# Patient Record
Sex: Male | Born: 1950
Health system: Southern US, Community
[De-identification: ages and names within clinical notes are randomized; demographics above are authoritative.]

## PROBLEM LIST (undated history)

## (undated) DIAGNOSIS — G250 Essential tremor: Secondary | ICD-10-CM

## (undated) DIAGNOSIS — T7840XA Allergy, unspecified, initial encounter: Secondary | ICD-10-CM

## (undated) DIAGNOSIS — J301 Allergic rhinitis due to pollen: Secondary | ICD-10-CM

## (undated) DIAGNOSIS — F1910 Other psychoactive substance abuse, uncomplicated: Secondary | ICD-10-CM

## (undated) DIAGNOSIS — F1010 Alcohol abuse, uncomplicated: Secondary | ICD-10-CM

## (undated) DIAGNOSIS — Q602 Renal agenesis, unspecified: Secondary | ICD-10-CM

## (undated) DIAGNOSIS — C61 Malignant neoplasm of prostate: Secondary | ICD-10-CM

## (undated) DIAGNOSIS — K635 Polyp of colon: Secondary | ICD-10-CM

## (undated) DIAGNOSIS — Q600 Renal agenesis, unilateral: Secondary | ICD-10-CM

## (undated) DIAGNOSIS — N529 Male erectile dysfunction, unspecified: Secondary | ICD-10-CM

## (undated) DIAGNOSIS — K5520 Angiodysplasia of colon without hemorrhage: Secondary | ICD-10-CM

## (undated) HISTORY — DX: Essential tremor: G25.0

## (undated) HISTORY — DX: Renal agenesis, unilateral: Q60.0

## (undated) HISTORY — DX: Angiodysplasia of colon without hemorrhage: K55.20

## (undated) HISTORY — DX: Malignant neoplasm of prostate: C61

## (undated) HISTORY — DX: Male erectile dysfunction, unspecified: N52.9

## (undated) HISTORY — PX: PROSTATE SURGERY: SHX751

## (undated) HISTORY — DX: Allergy, unspecified, initial encounter: T78.40XA

## (undated) HISTORY — DX: Other psychoactive substance abuse, uncomplicated: F19.10

## (undated) HISTORY — PX: COLONOSCOPY: SHX174

## (undated) HISTORY — DX: Renal agenesis, unspecified: Q60.2

## (undated) HISTORY — DX: Alcohol abuse, uncomplicated: F10.10

## (undated) HISTORY — DX: Polyp of colon: K63.5

## (undated) HISTORY — DX: Allergic rhinitis due to pollen: J30.1

---

## 1975-03-11 HISTORY — PX: VASECTOMY: SHX75

## 1996-03-10 DIAGNOSIS — G250 Essential tremor: Secondary | ICD-10-CM

## 1996-03-10 HISTORY — DX: Essential tremor: G25.0

## 2007-03-11 DIAGNOSIS — F1010 Alcohol abuse, uncomplicated: Secondary | ICD-10-CM

## 2007-03-11 HISTORY — DX: Alcohol abuse, uncomplicated: F10.10

## 2008-03-10 DIAGNOSIS — C61 Malignant neoplasm of prostate: Secondary | ICD-10-CM

## 2008-03-10 HISTORY — PX: ROBOT ASSISTED LAPAROSCOPIC RADICAL PROSTATECTOMY: SHX5141

## 2008-03-10 HISTORY — DX: Malignant neoplasm of prostate: C61

## 2009-03-10 DIAGNOSIS — K635 Polyp of colon: Secondary | ICD-10-CM

## 2009-03-10 HISTORY — DX: Polyp of colon: K63.5

## 2010-03-10 HISTORY — PX: KNEE ARTHROSCOPY: SUR90

## 2012-08-31 ENCOUNTER — Telehealth: Payer: Self-pay | Admitting: Radiology

## 2012-08-31 ENCOUNTER — Ambulatory Visit (INDEPENDENT_AMBULATORY_CARE_PROVIDER_SITE_OTHER): Payer: BC Managed Care – PPO | Admitting: Internal Medicine

## 2012-08-31 ENCOUNTER — Encounter: Payer: Self-pay | Admitting: Internal Medicine

## 2012-08-31 VITALS — BP 130/80 | HR 67 | Temp 98.0°F | Ht 73.0 in | Wt 183.0 lb

## 2012-08-31 DIAGNOSIS — Z23 Encounter for immunization: Secondary | ICD-10-CM

## 2012-08-31 DIAGNOSIS — Q600 Renal agenesis, unilateral: Secondary | ICD-10-CM | POA: Insufficient documentation

## 2012-08-31 DIAGNOSIS — N529 Male erectile dysfunction, unspecified: Secondary | ICD-10-CM

## 2012-08-31 DIAGNOSIS — Z8546 Personal history of malignant neoplasm of prostate: Secondary | ICD-10-CM | POA: Insufficient documentation

## 2012-08-31 DIAGNOSIS — J301 Allergic rhinitis due to pollen: Secondary | ICD-10-CM | POA: Insufficient documentation

## 2012-08-31 DIAGNOSIS — G250 Essential tremor: Secondary | ICD-10-CM | POA: Insufficient documentation

## 2012-08-31 DIAGNOSIS — C61 Malignant neoplasm of prostate: Secondary | ICD-10-CM

## 2012-08-31 DIAGNOSIS — G252 Other specified forms of tremor: Secondary | ICD-10-CM

## 2012-08-31 DIAGNOSIS — Z0000 Encounter for general adult medical examination without abnormal findings: Secondary | ICD-10-CM

## 2012-08-31 LAB — HEPATIC FUNCTION PANEL
Bilirubin, Direct: 0.2 mg/dL (ref 0.0–0.3)
Total Bilirubin: 1.1 mg/dL (ref 0.3–1.2)

## 2012-08-31 LAB — TSH: TSH: 1.56 u[IU]/mL (ref 0.35–5.50)

## 2012-08-31 LAB — LIPID PANEL
HDL: 91.3 mg/dL (ref >39.00)
Triglycerides: 49 mg/dL (ref 0.0–149.0)

## 2012-08-31 LAB — CBC WITH DIFFERENTIAL/PLATELET
Basophils Absolute: 0.1 10*3/uL (ref 0.0–0.1)
Basophils Relative: 0.6 % (ref 0.0–3.0)
Eosinophils Absolute: 0.1 10*3/uL (ref 0.0–0.7)
Lymphocytes Relative: 20.2 % (ref 12.0–46.0)
MCHC: 33.6 g/dL (ref 30.0–36.0)
Neutrophils Relative %: 70.9 % (ref 43.0–77.0)
Platelets: 233 10*3/uL (ref 150.0–400.0)
RBC: 5.1 Mil/uL (ref 4.22–5.81)
RDW: 15 % — ABNORMAL HIGH (ref 11.5–14.6)

## 2012-08-31 LAB — BASIC METABOLIC PANEL
BUN: 12 mg/dL (ref 6–23)
CO2: 28 mEq/L (ref 19–32)
Calcium: 10 mg/dL (ref 8.4–10.5)
Creatinine, Ser: 1.1 mg/dL (ref 0.4–1.5)

## 2012-08-31 LAB — PSA: PSA: 0.01 ng/mL — ABNORMAL LOW (ref 0.10–4.00)

## 2012-08-31 MED ORDER — PROPRANOLOL HCL 40 MG PO TABS: 40.0000 mg | ORAL_TABLET | Freq: Two times a day (BID) | ORAL | Status: DC

## 2012-08-31 MED ORDER — SILDENAFIL CITRATE 100 MG PO TABS: 100.0000 mg | ORAL_TABLET | Freq: Every day | ORAL | Status: DC | PRN

## 2012-08-31 NOTE — Assessment & Plan Note (Signed)
Overdue for PSA Will set up with urology if any bump

## 2012-08-31 NOTE — Assessment & Plan Note (Signed)
occ claritin

## 2012-08-31 NOTE — Telephone Encounter (Signed)
[facility] called a critical K+ - 6.2. Results given to Dr Alphonsus Sias.

## 2012-08-31 NOTE — Progress Notes (Signed)
Subjective:    Patient ID: Juan Holmes, male    DOB: Mar 24, 1950, 62 y.o.   MRN: [id number]  HPI Moved to [facility] about a year ago--from [facility] Hasn't seen a primary care doctor since then--last physical over a year ago  Prostate cancer 2010 Had prostatectomy Last PSA 0.01 about 1.5 years ago  Seasonal allergies-- worse here than in [facility] Will rarely take claritin  Diagnosis of essential tremor Only right hand tremor with intention Propranolol helps Goes back 5-6 years and hasn't progressed No family history  Still smokes but not daily Is trying patch to quit and has tried e-cigarettes Usually with beer on weekends  No current outpatient prescriptions on file prior to visit.   No current facility-administered medications on file prior to visit.    Allergies  Allergen Reactions  . Penicillins Hives    Past Medical History  Diagnosis Date  . Benign essential tremor 1998  . Prostate cancer 2010    total prostatectomy  . Allergic rhinitis due to pollen   . ED (erectile dysfunction)     Past Surgical History  Procedure Laterality Date  . Robot assisted laparoscopic radical prostatectomy N/A 2010    [facility] urology   . Knee arthroscopy Right 2012  . Vasectomy  1977    Family History  Problem Relation Age of Onset  . Cancer Mother   . Heart disease Mother   . Stroke Father   . Hyperlipidemia Brother   . Hypertension Brother   . Diabetes Neg Hx   . Hyperlipidemia Brother   . Hypertension Brother   . Hyperlipidemia Brother   . Hypertension Brother   . Hyperlipidemia Brother   . Hypertension Brother     History   Social History  . Marital Status: Divorced    Spouse Name: N/A    Number of Children: 2  . Years of Education: N/A   Occupational History  . Nurse, adult     Retired   Social History Main Topics  . Smoking status: Current Some Day Smoker  . Smokeless tobacco: Never Used  . Alcohol Use: Yes  . Drug Use: No   . Sexually Active: Not on file   Other Topics Concern  . Not on file   Social History Narrative  . No narrative on file   Review of Systems  Constitutional: Negative for fatigue and unexpected weight change.       Wears seat belt Runs regularly and keeps in shape  HENT: Positive for congestion and rhinorrhea. Negative for hearing loss, dental problem and tinnitus.        Regular with dentist  Eyes: Negative for redness and visual disturbance.  Respiratory: Negative for cough, chest tightness and shortness of breath.   Cardiovascular: Negative for chest pain, palpitations and leg swelling.  Gastrointestinal: Negative for nausea, vomiting, abdominal pain, constipation and blood in stool.  Endocrine: Negative for cold intolerance and heat intolerance.  Genitourinary: Negative for difficulty urinating.       No incontinence Generally satisfied with levitra  Musculoskeletal: Negative for back pain, joint swelling and arthralgias.       Does see chiropractor every other week--makes him feel better  Skin: Negative for rash.       Sees a dermatologist regularly  Allergic/Immunologic: Positive for environmental allergies. Negative for immunocompromised state.       Spring mostly PRN claritin  Neurological: Positive for numbness. Negative for dizziness, syncope, weakness, light-headedness and headaches.  Arms numb when sleeping --- gets better with movement  Hematological: Negative for adenopathy. Does not bruise/bleed easily.  Psychiatric/Behavioral: Negative for sleep disturbance and dysphoric mood. The patient is not nervous/anxious.        In relationship with fiancee From [place]  They live together       Objective:   Physical Exam  Constitutional: He is oriented to person, place, and time. He appears well-developed and well-nourished. No distress.  HENT:  Head: Normocephalic and atraumatic.  Right Ear: External ear normal.  Left Ear: External ear normal.   Mouth/Throat: Oropharynx is clear and moist. No oropharyngeal exudate.  Eyes: Conjunctivae and EOM are normal. Pupils are equal, round, and reactive to light.  Neck: Normal range of motion. Neck supple. No thyromegaly present.  Cardiovascular: Normal rate, regular rhythm, normal heart sounds and intact distal pulses.  Exam reveals no gallop.   No murmur heard. Pulmonary/Chest: Effort normal and breath sounds normal. No respiratory distress. He has no wheezes. He has no rales.  Abdominal: Soft. There is no tenderness.  Musculoskeletal: He exhibits no edema and no tenderness.  Lymphadenopathy:    He has no cervical adenopathy.  Neurological: He is alert and oriented to person, place, and time.  Skin: No rash noted. No erythema.  Psychiatric: He has a normal mood and affect. His behavior is normal.          Assessment & Plan:

## 2012-08-31 NOTE — Assessment & Plan Note (Signed)
Healthy Colonoscopy due in 2016 Tdap today

## 2012-08-31 NOTE — Assessment & Plan Note (Signed)
Does okay with the med 

## 2012-08-31 NOTE — Addendum Note (Signed)
Addended by: Sueanne Margarita on: 08/31/2012 12:49 PM   Modules accepted: Orders

## 2012-08-31 NOTE — Assessment & Plan Note (Signed)
Mild right hand intention tremor Will refill the propranolol

## 2012-09-01 ENCOUNTER — Encounter: Payer: Self-pay | Admitting: Internal Medicine

## 2012-09-01 ENCOUNTER — Telehealth: Payer: Self-pay

## 2012-09-01 ENCOUNTER — Other Ambulatory Visit (INDEPENDENT_AMBULATORY_CARE_PROVIDER_SITE_OTHER): Payer: BC Managed Care – PPO

## 2012-09-01 ENCOUNTER — Encounter: Payer: Self-pay | Admitting: *Deleted

## 2012-09-01 DIAGNOSIS — E875 Hyperkalemia: Secondary | ICD-10-CM

## 2012-09-01 LAB — POTASSIUM: Potassium: 5.1 mEq/L (ref 3.5–5.1)

## 2012-09-01 NOTE — Telephone Encounter (Signed)
Adenomatous polyps Has 5 year recall

## 2012-09-01 NOTE — Telephone Encounter (Signed)
Pt brought [facility] colon biopsy/polyp report which is in Dr Karle Starch in box.

## 2012-09-01 NOTE — Telephone Encounter (Signed)
Is on no meds to do this and probably hemolysis/lab issue  Discussed with [facility] yesterday Will set up repeat just to be sure

## 2012-12-22 DIAGNOSIS — L578 Other skin changes due to chronic exposure to nonionizing radiation: Secondary | ICD-10-CM | POA: Insufficient documentation

## 2013-01-11 ENCOUNTER — Ambulatory Visit (INDEPENDENT_AMBULATORY_CARE_PROVIDER_SITE_OTHER): Payer: No Typology Code available for payment source | Admitting: Podiatry

## 2013-01-11 ENCOUNTER — Ambulatory Visit (INDEPENDENT_AMBULATORY_CARE_PROVIDER_SITE_OTHER): Payer: No Typology Code available for payment source

## 2013-01-11 ENCOUNTER — Encounter: Payer: Self-pay | Admitting: Podiatry

## 2013-01-11 VITALS — BP 160/91 | HR 58 | Resp 16 | Ht 73.0 in | Wt 188.0 lb

## 2013-01-11 DIAGNOSIS — M79609 Pain in unspecified limb: Secondary | ICD-10-CM

## 2013-01-11 DIAGNOSIS — M79671 Pain in right foot: Secondary | ICD-10-CM

## 2013-01-11 DIAGNOSIS — L600 Ingrowing nail: Secondary | ICD-10-CM

## 2013-01-11 DIAGNOSIS — M7750 Other enthesopathy of unspecified foot: Secondary | ICD-10-CM

## 2013-01-11 MED ORDER — TRIAMCINOLONE ACETONIDE 10 MG/ML IJ SUSP
5.0000 mg | Freq: Once | INTRAMUSCULAR | Status: AC
  Administered 2013-01-11: 5 mg via INTRA_ARTICULAR

## 2013-01-11 NOTE — Patient Instructions (Addendum)

## 2013-01-11 NOTE — Progress Notes (Signed)
Subjective:     Patient ID: Juan Holmes, male   DOB: 20-Mar-1950, 62 y.o.   MRN: [id number]  Foot Pain   patient states I've had pain in the outside of both feet since I was on my foot and a fair for 5 straight hours 2 months ago. Also states that he has trouble with ingrown toenails of both big toes of approximate six-month duration  Review of Systems  All other systems reviewed and are negative.       Objective:   Physical Exam  Nursing note and vitals reviewed. Constitutional: He is oriented to person, place, and time.  Cardiovascular: Intact distal pulses.   Musculoskeletal: Normal range of motion.  Neurological: He is oriented to person, place, and time.  Skin: Skin is warm.   patient is found to have pain at the peroneal brevis insertion base of fifth metatarsal both feet and is found to have incurvated painful ingrown toenails lateral border both big toes. Neurovascular status is intact no muscle strength issues and no equinus condition noted     Assessment:     Tendinitis peroneal brevis insertion both feet secondary to excessive activity and ingrown toenails chronic hallux both feet    Plan:     H&P reviewed and both conditions discussed I then went ahead and also reviewed x-rays and then accomplished peroneal injections around the base of the fifth metatarsal both feet 3 mg Kenalog 5 mg Xylocaine Marcaine mixture. Discussed ingrown toenails he wants them corrected and I explained the risk associated with nail surgery. I infiltrated each hallux 60 mg Xylocaine Marcaine mixture remove the lateral borders exposed matrix and applied chemical 3 applications 30 seconds followed by alcohol lavage both feet and sterile dressing application. Gave him instructions on soaks and reappoint 2 weeks

## 2013-01-11 NOTE — Progress Notes (Signed)
N HURT L B/L FOOT LATERAL SIDE D SEPT 2014 O SUDDEN C SAME A PRESSURE ON OUTSIDE T 0   N TENDER L B/L GREAT TOENAILS D 83M O SLOWLY C SAME A GROWING OUT T HAS PEDICURES

## 2013-01-13 ENCOUNTER — Other Ambulatory Visit: Payer: Self-pay

## 2013-01-25 ENCOUNTER — Ambulatory Visit (INDEPENDENT_AMBULATORY_CARE_PROVIDER_SITE_OTHER): Payer: No Typology Code available for payment source | Admitting: Podiatry

## 2013-01-25 ENCOUNTER — Encounter: Payer: Self-pay | Admitting: Podiatry

## 2013-01-25 VITALS — BP 138/87 | HR 62 | Resp 16 | Ht 73.0 in | Wt 188.0 lb

## 2013-01-25 DIAGNOSIS — M7750 Other enthesopathy of unspecified foot: Secondary | ICD-10-CM

## 2013-01-25 NOTE — Progress Notes (Signed)
Subjective:     Patient ID: Juan Holmes, male   DOB: [birth year], 62 y.o.   MRN: [id number]  HPI patient points to both feet stating the tendons feel better and is ingrown toenails seem to be healing okay   Review of Systems     Objective:   Physical Exam  Constitutional: He is oriented to person, place, and time.  Cardiovascular: Intact distal pulses.   Musculoskeletal: Normal range of motion.  Neurological: He is oriented to person, place, and time.  Skin: Skin is warm.   patient is found to have reduced edema around the fifth metatarsal base bilateral with no pain when pressed and well-healing nail sites hallux both feet    Assessment:     Tendinitis improving both feet and well-healing ingrown toenail sites both feet    Plan:     Advised on reducing soaks pretty ingrown toenail and allowing them to air dry and occasional ice if the tendons should flare up. Reappoint as needed

## 2013-05-15 ENCOUNTER — Emergency Department: Payer: Self-pay | Admitting: Internal Medicine

## 2013-07-13 ENCOUNTER — Ambulatory Visit (INDEPENDENT_AMBULATORY_CARE_PROVIDER_SITE_OTHER)
Admission: RE | Admit: 2013-07-13 | Discharge: 2013-07-13 | Disposition: A | Discharge status: Home / Self Care | Payer: Managed Care, Other (non HMO) | Source: Ambulatory Visit | Attending: Internal Medicine | Admitting: Internal Medicine

## 2013-07-13 ENCOUNTER — Ambulatory Visit (INDEPENDENT_AMBULATORY_CARE_PROVIDER_SITE_OTHER): Payer: Managed Care, Other (non HMO) | Admitting: Internal Medicine

## 2013-07-13 ENCOUNTER — Encounter: Payer: Self-pay | Admitting: Internal Medicine

## 2013-07-13 VITALS — BP 108/68 | HR 62 | Temp 98.5°F | Wt 183.2 lb

## 2013-07-13 DIAGNOSIS — M25571 Pain in right ankle and joints of right foot: Secondary | ICD-10-CM

## 2013-07-13 DIAGNOSIS — M79671 Pain in right foot: Secondary | ICD-10-CM

## 2013-07-13 DIAGNOSIS — M25579 Pain in unspecified ankle and joints of unspecified foot: Secondary | ICD-10-CM

## 2013-07-13 DIAGNOSIS — M79609 Pain in unspecified limb: Secondary | ICD-10-CM

## 2013-07-13 NOTE — Patient Instructions (Addendum)
Ankle Exercises for Rehabilitation Following ankle injuries, it is as important to follow your caregivers instructions for regaining full use of your ankle as it was to follow the initial treatment plan following the injury. The following are some suggestions for exercises and treatment, which can be done to help you regain full use of your ankle as soon as possible.  Follow all instructions regarding physical therapy.  Before exercising, it may be helpful to use heat on the muscles or joint being exercised. This loosens up the muscles and tendons (cord like structure) and decreases chances of injury during your exercises. If this is not possible just begin your exercises slowly to gradually warm up.  Stand on your toes several times per dayto strengthen the calf muscles. These are the muscles in the back of your leg between the knee and the heel. The cord you can feel just above the heel is the Achilles tendon. Rise up on your toes several times repeating this three to four times per day. Do not exercise to the point of pain. If pain starts to develop, decrease the exercise until you are comfortable again.  Do range of motion exercises. This means moving the ankle in all directions. Practice writing the alphabet with your toes in the air. Do not increase beyond a range that is comfortable.  Increase the strength of the muscles in the front of your leg by raising your toes and foot straight up in the air. Repeat this exercise as you did the calf exercise with the same warnings. This also help to stretch your muscles.  Stretch your calf muscles also by leaning against a wall with your hands in front of you. Put your feet a few feet from the wall and bend your knees until you feel the muscles in your calves become tight.  After exercising it may be helpful to put ice on the ankle to prevent swelling and improve rehabilitation. This may be done for 15 to 20 minutes following your exercises. If exercising  is being done in the work place, this may not always be possible.  Taping an ankle injury may be helpful to give added support following an injury. It also may help prevent re-injury. This may be true if you are in training or in a conditioning program. You and your caregiver can decide on the best course of action to follow. Document Released: 02/22/2000 Document Revised: 05/19/2011 Document Reviewed: 02/19/2008 [facility] Patient Information 2014 [place], [place].

## 2013-07-13 NOTE — Progress Notes (Signed)
Subjective:    Patient ID: Juan Holmes, male    DOB: 05/04/1950, 63 y.o.   MRN: [id number]  HPI  Pt presents to the clinic today with c/o right foot pain. He reports this started 1 month ago. He reports the pain is worse first thing in the am. The pain is located on the top right side of his foot and around his ankle. He does not recall any specific injury to the area. He denies redness or swelling. He has had to change up his exercises from running to swimming. He has not taking anything for the pain. He does reports that he stands all day on a concrete floor.  Review of Systems      Past Medical History  Diagnosis Date  . Benign essential tremor 1998  . Prostate cancer 2010    total prostatectomy  . Allergic rhinitis due to pollen   . ED (erectile dysfunction)   . Congenital single kidney   . Colon polyps 2011    adenomatous    Current Outpatient Prescriptions  Medication Sig Dispense Refill  . propranolol (INDERAL) 40 MG tablet Take 1 tablet (40 mg total) by mouth 2 (two) times daily.  60 tablet  11  . [DISCONTINUED] vardenafil (LEVITRA) 20 MG tablet Take 20 mg by mouth daily as needed for erectile dysfunction.       No current facility-administered medications for this visit.    Allergies  Allergen Reactions  . Penicillins Hives    Family History  Problem Relation Age of Onset  . Cancer Mother   . Heart disease Mother   . Stroke Father   . Hyperlipidemia Brother   . Hypertension Brother   . Diabetes Neg Hx   . Hyperlipidemia Brother   . Hypertension Brother   . Hyperlipidemia Brother   . Hypertension Brother   . Hyperlipidemia Brother   . Hypertension Brother     History   Social History  . Marital Status: Divorced    Spouse Name: N/A    Number of Children: 2  . Years of Education: N/A   Occupational History  . Charity fundraiser     Retired   Social History Main Topics  . Smoking status: Current Some Day Smoker  . Smokeless tobacco: Never  Used  . Alcohol Use: No  . Drug Use: No  . Sexual Activity: Not on file   Other Topics Concern  . Not on file   Social History Narrative  . No narrative on file     Constitutional: Denies fever, malaise, fatigue, headache or abrupt weight changes.  Musculoskeletal: Pt reports right /anklefoot pain. Denies decrease in range of motion, difficulty with gait, muscle pain or joint swelling.  Skin: Denies redness, rashes, lesions or ulcercations.    No other specific complaints in a complete review of systems (except as listed in HPI above).   Objective:   Physical Exam   BP 108/68  Pulse 62  Temp(Src) 98.5 F (36.9 C) (Oral)  Wt 183 lb 4 oz (83.122 kg)  SpO2 97% Wt Readings from Last 3 Encounters:  07/13/13 183 lb 4 oz (83.122 kg)  01/25/13 188 lb (85.276 kg)  01/11/13 188 lb (85.276 kg)    General: Appears his stated age, well developed, well nourished in NAD. Cardiovascular: Normal rate and rhythm. S1,S2 noted.  No murmur, rubs or gallops noted. No JVD or BLE edema. No carotid bruits noted. Pulmonary/Chest: Normal effort and positive vesicular breath sounds. No respiratory distress. No  wheezes, rales or ronchi noted.  Musculoskeletal: Normal flexion, extension and rotation of the right ankle. Pain with palpation of the right anterior lateral edge of the foot and around the lateral malleolus. Strength 5/5 BLE. Pedal pulses intact.   BMET    Component Value Date/Time   NA 139 08/31/2012 1235   K 5.1 09/01/2012 0840   CL 103 08/31/2012 1235   CO2 28 08/31/2012 1235   GLUCOSE 113* 08/31/2012 1235   BUN 12 08/31/2012 1235   CREATININE 1.1 08/31/2012 1235   CALCIUM 10.0 08/31/2012 1235    Lipid Panel     Component Value Date/Time   CHOL 221* 08/31/2012 1235   TRIG 49.0 08/31/2012 1235   HDL 91.30 08/31/2012 1235   CHOLHDL 2 08/31/2012 1235   VLDL 9.8 08/31/2012 1235    CBC    Component Value Date/Time   WBC 8.2 08/31/2012 1235   RBC 5.10 08/31/2012 1235   HGB 16.4  08/31/2012 1235   HCT 48.8 08/31/2012 1235   PLT 233.0 08/31/2012 1235   MCV 95.7 08/31/2012 1235   MCHC 33.6 08/31/2012 1235   RDW 15.0* 08/31/2012 1235   LYMPHSABS 1.7 08/31/2012 1235   MONOABS 0.6 08/31/2012 1235   EOSABS 0.1 08/31/2012 1235   BASOSABS 0.1 08/31/2012 1235    Hgb A1C No results found for this basename: HGBA1C        Assessment & Plan:   Right foot/ankle pain:  Will check xray to r/o stress fracture Encouraged him to hold off on high impact sports at this time Ok to take Ibuprofen if needed for pain  Will follow up after xray is done

## 2013-07-13 NOTE — Progress Notes (Signed)
Pre visit review using our clinic review tool, if applicable. No additional management support is needed unless otherwise documented below in the visit note. 

## 2013-09-12 ENCOUNTER — Encounter: Payer: Self-pay | Admitting: Internal Medicine

## 2013-09-12 ENCOUNTER — Ambulatory Visit (INDEPENDENT_AMBULATORY_CARE_PROVIDER_SITE_OTHER): Payer: Managed Care, Other (non HMO) | Admitting: Internal Medicine

## 2013-09-12 VITALS — BP 150/80 | HR 67 | Temp 98.3°F | Wt 185.0 lb

## 2013-09-12 DIAGNOSIS — F1021 Alcohol dependence, in remission: Secondary | ICD-10-CM | POA: Insufficient documentation

## 2013-09-12 DIAGNOSIS — F1010 Alcohol abuse, uncomplicated: Secondary | ICD-10-CM

## 2013-09-12 NOTE — Assessment & Plan Note (Signed)
Chronic and dysfunctional Not continuous and high functioning but sounds appropriate to do 28 day inpatient rehab Will continue with AA after Form done

## 2013-09-12 NOTE — Progress Notes (Signed)
   Subjective:    Patient ID: Juan Holmes, male    DOB: 01/10/1951, 63 y.o.   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 propranolol (INDERAL) 40 MG tablet Take 1 tablet (40 mg total) by mouth 2 (two) times daily.  60 tablet  11  . [DISCONTINUED] vardenafil (LEVITRA) 20 MG tablet Take 20 mg by mouth daily as needed for erectile dysfunction.       No current facility-administered medications on file prior to visit.    Allergies  Allergen Reactions  . Penicillins Hives    Past Medical History  Diagnosis Date  . Benign essential tremor 1998  . Prostate cancer 2010    total prostatectomy  . Allergic rhinitis due to pollen   . ED (erectile dysfunction)   . Congenital single kidney   . Colon polyps 2011    adenomatous    Past Surgical History  Procedure Laterality Date  . Robot assisted laparoscopic radical prostatectomy N/A 2010    [facility] urology   . Knee arthroscopy Right 2012  . Vasectomy  1977    Family History  Problem Relation Age of Onset  . Cancer Mother   . Heart disease Mother   . Stroke Father   . Hyperlipidemia Brother   . Hypertension Brother   . Diabetes Neg Hx   . Hyperlipidemia Brother   . Hypertension Brother   . Hyperlipidemia Brother   . Hypertension Brother   . Hyperlipidemia  Brother   . Hypertension Brother     History   Social History  . Marital Status: Divorced    Spouse Name: N/A    Number of Children: 2  . Years of Education: N/A   Occupational History  . Charity fundraiser     Retired   Social History Main Topics  . Smoking status: Current Some Day Smoker  . Smokeless tobacco: Never Used  . Alcohol Use: No  . Drug Use: No  . Sexual Activity: Not on file   Other Topics Concern  . Not on file   Social History Narrative  . No narrative on file   Review of Systems Sleep is usually okay--may be affected by stress at times Some achiness in joints Appetite is okay Goes to gym daily     Objective:   Physical Exam  Constitutional: He appears well-developed and well-nourished. No distress.  Psychiatric: He has a normal mood and affect. His behavior is normal. Judgment and thought content normal.          Assessment & Plan:

## 2013-09-19 ENCOUNTER — Other Ambulatory Visit: Payer: Self-pay | Admitting: Internal Medicine

## 2014-01-26 ENCOUNTER — Other Ambulatory Visit: Payer: Self-pay | Admitting: Internal Medicine

## 2014-02-07 ENCOUNTER — Telehealth: Payer: Self-pay

## 2014-02-07 NOTE — Telephone Encounter (Signed)
I called patient and he said Juan Holmes is looking for something stating that Dr.Letvak agreed patient should go to the inpatient rehab in [place].  [employer] asked for records from 09/25/13-10/23/13.  Patient wasn't seen during this time, so I sent 09/12/13 office note to [facility].  I asked patient if it was ok with him that I send the 09/12/13 office note to [facility] and he said yes. Office note was mailed to [employer] with the [place] [employer] ID and Claim number written at the top of the note on 02/07/14.

## 2014-02-07 NOTE — Telephone Encounter (Signed)
Lattie Haw with [facility] left v/m requesting information to file claims for pt when he saw Dr Silvio Pate dates of service from 09/25/13 until 10/23/13, Request copy of facility record, office note,H&P and diagnostic reports sent to [place] [id number], [place] [address]. Include [employer] ID # [id number] and claim # [id number]. Lattie Haw request pt be cb at [phone] that info was sent as requested. I dod not see DOS during the time frame given.

## 2014-03-21 ENCOUNTER — Encounter: Payer: Managed Care, Other (non HMO) | Admitting: Internal Medicine

## 2014-03-22 ENCOUNTER — Encounter: Payer: Managed Care, Other (non HMO) | Admitting: Internal Medicine

## 2014-06-26 ENCOUNTER — Encounter: Payer: Managed Care, Other (non HMO) | Admitting: Internal Medicine

## 2014-06-30 ENCOUNTER — Encounter: Payer: Managed Care, Other (non HMO) | Admitting: Internal Medicine

## 2014-08-11 ENCOUNTER — Ambulatory Visit (INDEPENDENT_AMBULATORY_CARE_PROVIDER_SITE_OTHER): Payer: Managed Care, Other (non HMO) | Admitting: Internal Medicine

## 2014-08-11 ENCOUNTER — Encounter: Payer: Self-pay | Admitting: Internal Medicine

## 2014-08-11 VITALS — BP 140/80 | HR 58 | Temp 98.0°F | Ht 73.0 in | Wt 186.0 lb

## 2014-08-11 DIAGNOSIS — G250 Essential tremor: Secondary | ICD-10-CM | POA: Diagnosis not present

## 2014-08-11 DIAGNOSIS — Z0000 Encounter for general adult medical examination without abnormal findings: Secondary | ICD-10-CM

## 2014-08-11 DIAGNOSIS — E785 Hyperlipidemia, unspecified: Secondary | ICD-10-CM

## 2014-08-11 DIAGNOSIS — K635 Polyp of colon: Secondary | ICD-10-CM

## 2014-08-11 DIAGNOSIS — Z8546 Personal history of malignant neoplasm of prostate: Secondary | ICD-10-CM | POA: Diagnosis not present

## 2014-08-11 DIAGNOSIS — F1021 Alcohol dependence, in remission: Secondary | ICD-10-CM

## 2014-08-11 LAB — LIPID PANEL
Cholesterol: 198 mg/dL (ref 0–200)
HDL: 62.6 mg/dL (ref >39.00)
LDL CALC: 110 mg/dL — AB (ref 0–99)
NonHDL: 135.4
Total CHOL/HDL Ratio: 3
Triglycerides: 129 mg/dL (ref 0.0–149.0)
VLDL: 25.8 mg/dL (ref 0.0–40.0)

## 2014-08-11 LAB — CBC WITH DIFFERENTIAL/PLATELET
BASOS PCT: 0.7 % (ref 0.0–3.0)
Basophils Absolute: 0 10*3/uL (ref 0.0–0.1)
Eosinophils Absolute: 0.2 10*3/uL (ref 0.0–0.7)
Eosinophils Relative: 3.4 % (ref 0.0–5.0)
HCT: 45.4 % (ref 39.0–52.0)
Hemoglobin: 15.2 g/dL (ref 13.0–17.0)
LYMPHS ABS: 1.2 10*3/uL (ref 0.7–4.0)
Lymphocytes Relative: 18 % (ref 12.0–46.0)
MCHC: 33.5 g/dL (ref 30.0–36.0)
MCV: 89.9 fl (ref 78.0–100.0)
MONO ABS: 0.7 10*3/uL (ref 0.1–1.0)
Monocytes Relative: 10 % (ref 3.0–12.0)
Neutro Abs: 4.5 10*3/uL (ref 1.4–7.7)
Neutrophils Relative %: 67.9 % (ref 43.0–77.0)
Platelets: 223 10*3/uL (ref 150.0–400.0)
RBC: 5.05 Mil/uL (ref 4.22–5.81)
RDW: 14.3 % (ref 11.5–15.5)
WBC: 6.6 10*3/uL (ref 4.0–10.5)

## 2014-08-11 LAB — COMPREHENSIVE METABOLIC PANEL
ALBUMIN: 4.4 g/dL (ref 3.5–5.2)
ALK PHOS: 59 U/L (ref 39–117)
ALT: 21 U/L (ref 0–53)
AST: 9 U/L (ref 0–37)
BUN: 14 mg/dL (ref 6–23)
CO2: 30 mEq/L (ref 19–32)
Calcium: 9.5 mg/dL (ref 8.4–10.5)
Chloride: 103 mEq/L (ref 96–112)
Creatinine, Ser: 0.95 mg/dL (ref 0.40–1.50)
GFR: 85.03 mL/min (ref >60.00)
Glucose, Bld: 103 mg/dL — ABNORMAL HIGH (ref 70–99)
POTASSIUM: 5.2 meq/L — AB (ref 3.5–5.1)
Sodium: 137 mEq/L (ref 135–145)
Total Bilirubin: 0.6 mg/dL (ref 0.2–1.2)
Total Protein: 7 g/dL (ref 6.0–8.3)

## 2014-08-11 LAB — PSA: PSA: 0.02 ng/mL — ABNORMAL LOW (ref 0.10–4.00)

## 2014-08-11 LAB — T4, FREE: Free T4: 0.56 ng/dL — ABNORMAL LOW (ref 0.60–1.60)

## 2014-08-11 MED ORDER — ZOSTER VACCINE LIVE 19400 UNT/0.65ML ~~LOC~~ SOLR: 0.6500 mL | Freq: Once | SUBCUTANEOUS | Status: DC

## 2014-08-11 MED ORDER — SILDENAFIL CITRATE 20 MG PO TABS: 60.0000 mg | ORAL_TABLET | Freq: Every day | ORAL | Status: DC | PRN

## 2014-08-11 NOTE — Assessment & Plan Note (Signed)
Uses the propranolol prn

## 2014-08-11 NOTE — Patient Instructions (Signed)
I recommend Drs Kellie Moor, Dasher and Marolyn Hammock in [place] for dermatology

## 2014-08-11 NOTE — Assessment & Plan Note (Signed)
Overdue for PSA

## 2014-08-11 NOTE — Assessment & Plan Note (Signed)
Remaining abstinent

## 2014-08-11 NOTE — Assessment & Plan Note (Signed)
2 tubular adenomas 5 years ago Will refer to GI

## 2014-08-11 NOTE — Assessment & Plan Note (Addendum)
Healthy zostavax Rx Recommended flu shot Stays fit Rarely smokes but I told him he should quit completely

## 2014-08-11 NOTE — Progress Notes (Signed)
Subjective:    Patient ID: Juan Holmes, male    DOB: 1950/04/13, 64 y.o.   MRN: [id number]  HPI Here for physical  Has some skin spots he is concerned about  Needs to establish with dermatologist  Did well in the inpatient rehab Learned a lot about addiction Abstinent since then  Inconsistent with med for tremor Uses prn  viagra was too much Cuts them in quarters  Current Outpatient Prescriptions on File Prior to Visit  Medication Sig Dispense Refill  . propranolol (INDERAL) 40 MG tablet TAKE ONE TABLET BY MOUTH TWICE DAILY  60 tablet 10  . VIAGRA 100 MG tablet TAKE ONE TABLET BY MOUTH DAILY AS NEEDED FOR ERECTILLE DYSFUNCTION  4 tablet 10  . [DISCONTINUED] vardenafil (LEVITRA) 20 MG tablet Take 20 mg by mouth daily as needed for erectile dysfunction.     No current facility-administered medications on file prior to visit.    Allergies  Allergen Reactions  . Penicillins Hives    Past Medical History  Diagnosis Date  . Benign essential tremor 1998  . Prostate cancer 2010    total prostatectomy  . Allergic rhinitis due to pollen   . ED (erectile dysfunction)   . Congenital single kidney   . Colon polyps 2011    adenomatous  . Chronic alcohol abuse 2009    Past Surgical History  Procedure Laterality Date  . Robot assisted laparoscopic radical prostatectomy N/A 2010    [facility] urology   . Knee arthroscopy Right 2012  . Vasectomy  1977    Family History  Problem Relation Age of Onset  . Cancer Mother   . Heart disease Mother   . Stroke Father   . Hyperlipidemia Brother   . Hypertension Brother   . Diabetes Neg Hx   . Hyperlipidemia Brother   . Hypertension Brother   . Hyperlipidemia Brother   . Hypertension Brother   . Hyperlipidemia Brother   . Hypertension Brother     History   Social History  . Marital Status: Married    Spouse Name: N/A  . Number of Children: 2  . Years of Education: N/A   Occupational History  . Regulatory affairs officer     Retired   Social History Main Topics  . Smoking status: Current Some Day Smoker  . Smokeless tobacco: Never Used  . Alcohol Use: No  . Drug Use: No  . Sexual Activity: Not on file   Other Topics Concern  . Not on file   Social History Narrative   Review of Systems  Constitutional: Negative for fatigue and unexpected weight change.       Wears seat belt Does exercise--bikes  HENT: Negative for dental problem, hearing loss and tinnitus.        Keeps up with dentist  Eyes: Negative for visual disturbance.       No diplopia or unilateral vision loss  Respiratory: Negative for cough, chest tightness and shortness of breath.   Cardiovascular: Negative for chest pain, palpitations and leg swelling.  Gastrointestinal: Negative for nausea, vomiting, abdominal pain, constipation and blood in stool.  Endocrine: Negative for polydipsia and polyuria.  Genitourinary: Positive for difficulty urinating. Negative for urgency and frequency.       Some dribbling Low dose sildenafil helps his ED  Musculoskeletal: Negative for back pain, joint swelling and arthralgias.  Skin: Negative for rash.  Allergic/Immunologic: Positive for environmental allergies. Negative for immunocompromised state.       Rarely uses  cetirizine  Neurological: Negative for dizziness, syncope, weakness, light-headedness, numbness and headaches.  Hematological: Negative for adenopathy. Does not bruise/bleed easily.  Psychiatric/Behavioral: Negative for sleep disturbance and dysphoric mood. The patient is not nervous/anxious.        Objective:   Physical Exam  Constitutional: He is oriented to person, place, and time. He appears well-developed and well-nourished. No distress.  HENT:  Head: Normocephalic and atraumatic.  Right Ear: External ear normal.  Left Ear: External ear normal.  Mouth/Throat: Oropharynx is clear and moist. No oropharyngeal exudate.  Eyes: Conjunctivae and EOM are normal. Pupils are  equal, round, and reactive to light.  Neck: Normal range of motion. Neck supple. No thyromegaly present.  Cardiovascular: Normal rate, regular rhythm, normal heart sounds and intact distal pulses.  Exam reveals no gallop.   No murmur heard. Pulmonary/Chest: Effort normal and breath sounds normal. No respiratory distress. He has no wheezes. He has no rales.  Abdominal: Soft. There is no tenderness.  Musculoskeletal: He exhibits no edema or tenderness.  Lymphadenopathy:    He has no cervical adenopathy.  Neurological: He is alert and oriented to person, place, and time.  Skin: No rash noted. No erythema.  Psychiatric: He has a normal mood and affect. His behavior is normal.          Assessment & Plan:

## 2014-08-11 NOTE — Assessment & Plan Note (Signed)
Low risk profile with high HDL Will just recheck

## 2014-08-11 NOTE — Progress Notes (Signed)
Pre visit review using our clinic review tool, if applicable. No additional management support is needed unless otherwise documented below in the visit note. 

## 2014-08-29 ENCOUNTER — Encounter: Payer: Self-pay | Admitting: Internal Medicine

## 2014-09-05 ENCOUNTER — Encounter: Payer: Self-pay | Admitting: Internal Medicine

## 2014-09-05 DIAGNOSIS — Z86010 Personal history of colonic polyps: Secondary | ICD-10-CM

## 2014-09-08 ENCOUNTER — Encounter: Payer: Self-pay | Admitting: Internal Medicine

## 2014-09-19 ENCOUNTER — Encounter: Payer: Self-pay | Admitting: Internal Medicine

## 2014-12-08 ENCOUNTER — Encounter: Payer: Managed Care, Other (non HMO) | Admitting: Internal Medicine

## 2015-01-25 ENCOUNTER — Encounter: Payer: Managed Care, Other (non HMO) | Admitting: Internal Medicine

## 2015-03-20 ENCOUNTER — Ambulatory Visit (AMBULATORY_SURGERY_CENTER): Payer: Self-pay | Admitting: *Deleted

## 2015-03-20 VITALS — Ht 73.0 in | Wt 192.6 lb

## 2015-03-20 DIAGNOSIS — Z86010 Personal history of colonic polyps: Secondary | ICD-10-CM

## 2015-03-20 NOTE — Progress Notes (Signed)
Denies allergies to eggs or soy products. Denies complications with sedation or anesthesia. Denies O2 use. Denies use of diet or weight loss medications.  Emmi instructions given for colonoscopy.  

## 2015-04-03 ENCOUNTER — Encounter: Payer: Self-pay | Admitting: Internal Medicine

## 2015-04-03 ENCOUNTER — Ambulatory Visit (AMBULATORY_SURGERY_CENTER): Payer: Managed Care, Other (non HMO) | Admitting: Internal Medicine

## 2015-04-03 VITALS — BP 105/54 | HR 47 | Temp 97.3°F | Resp 31 | Ht 73.0 in | Wt 186.0 lb

## 2015-04-03 DIAGNOSIS — Z860100 Personal history of colon polyps, unspecified: Secondary | ICD-10-CM | POA: Insufficient documentation

## 2015-04-03 DIAGNOSIS — K5520 Angiodysplasia of colon without hemorrhage: Secondary | ICD-10-CM

## 2015-04-03 HISTORY — DX: Angiodysplasia of colon without hemorrhage: K55.20

## 2015-04-03 MED ORDER — SODIUM CHLORIDE 0.9 % IV SOLN: 500.0000 mL | INTRAVENOUS | Status: DC

## 2015-04-03 NOTE — Progress Notes (Signed)
Report to PACU, RN, vss, BBS= Clear.  

## 2015-04-03 NOTE — Op Note (Signed)
[facility]  [address] [place], [id number]   COLONOSCOPY PROCEDURE REPORT  PATIENT: Juan Holmes, Juan Holmes  MR#: [id number] BIRTHDATE: 1950-06-30 , 64  yrs. old GENDER: male ENDOSCOPIST: Gatha Mayer, MD, [facility] PROCEDURE DATE:  04/03/2015 PROCEDURE:   Colonoscopy, surveillance First Screening Colonoscopy - Avg.  risk and is 50 yrs.  old or older - No.  Prior Negative Screening - Now for repeat screening. N/A  History of Adenoma - Now for follow-up colonoscopy & has been > or = to 3 yrs.  Yes hx of adenoma.  Has been 3 or more years since last colonoscopy.  Polyps removed today? Yes ASA CLASS:   Class II INDICATIONS:Surveillance due to prior colonic neoplasia and PH Colon Adenoma. MEDICATIONS: Propofol 200 mg IV and Monitored anesthesia care  DESCRIPTION OF PROCEDURE:   After the risks benefits and alternatives of the procedure were thoroughly explained, informed consent was obtained.  The digital rectal exam revealed no rectal mass and revealed a surgically absent prostate.   The LB [id number]  endoscope was introduced through the anus and advanced to the cecum, which was identified by both the appendix and ileocecal valve. No adverse events experienced.   The quality of the prep was excellent.  (MiraLax was used)  The instrument was then slowly withdrawn as the colon was fully examined. Estimated blood loss is zero unless otherwise noted in this procedure report.      COLON FINDINGS: An arteriovenous malformation measuring 47mm in size was found at the cecum.   There was moderate diverticulosis noted throughout the entire examined colon.   The examination was otherwise normal.  Retroflexed views revealed no abnormalities. The time to cecum = 1.8 Withdrawal time = 8.1   The scope was withdrawn and the procedure completed. COMPLICATIONS: There were no immediate complications.  ENDOSCOPIC IMPRESSION: 1.   Arteriovenous malformation measuring 77mm in size was  found at the cecum 2.   Moderate diverticulosis was noted throughout the entire examined colon 3.   The examination was otherwise normal  RECOMMENDATIONS: Repeat Colonoscopy in 5 years.  Hx 2-3 adenomas 2011 (at least 2)   eSigned:  Gatha Mayer, MD, [facility] 04/03/2015 9:37 AM   cc: The Patient

## 2015-04-03 NOTE — Patient Instructions (Addendum)
No polyps today.  You also have a condition called diverticulosis - common and not usually a problem. Please read the handout provided.  You have a small AVM or arteriovenous malformation, also called angiodysplasia. Almost never causes a problem - can sometimes bleed but can be fixed if it does.  Your next routine colonoscopy should be in 5 years - 2022.  I appreciate the opportunity to care for you. Gatha Mayer, MD, [facility]   Discharge instructions given. Handout on diverticulosis. Resume previous medications. YOU HAD AN ENDOSCOPIC PROCEDURE TODAY AT [facility]:   Refer to the procedure report that was given to you for any specific questions about what was found during the examination.  If the procedure report does not answer your questions, please call your gastroenterologist to clarify.  If you requested that your care partner not be given the details of your procedure findings, then the procedure report has been included in a sealed envelope for you to review at your convenience later.  YOU SHOULD EXPECT: Some feelings of bloating in the abdomen. Passage of more gas than usual.  Walking can help get rid of the air that was put into your GI tract during the procedure and reduce the bloating. If you had a lower endoscopy (such as a colonoscopy or flexible sigmoidoscopy) you may notice spotting of blood in your stool or on the toilet paper. If you underwent a bowel prep for your procedure, you may not have a normal bowel movement for a few days.  Please Note:  You might notice some irritation and congestion in your nose or some drainage.  This is from the oxygen used during your procedure.  There is no need for concern and it should clear up in a day or so.  SYMPTOMS TO REPORT IMMEDIATELY:   Following lower endoscopy (colonoscopy or flexible sigmoidoscopy):  Excessive amounts of blood in the stool  Significant tenderness or worsening of abdominal  pains  Swelling of the abdomen that is new, acute  Fever of 100F or higher   For urgent or emergent issues, a gastroenterologist can be reached at any hour by calling [phone].   DIET: Your first meal following the procedure should be a small meal and then it is ok to progress to your normal diet. Heavy or fried foods are harder to digest and may make you feel nauseous or bloated.  Likewise, meals heavy in dairy and vegetables can increase bloating.  Drink plenty of fluids but you should avoid alcoholic beverages for 24 hours.  ACTIVITY:  You should plan to take it easy for the rest of today and you should NOT DRIVE or use heavy machinery until tomorrow (because of the sedation medicines used during the test).    FOLLOW UP: Our staff will call the number listed on your records the next business day following your procedure to check on you and address any questions or concerns that you may have regarding the information given to you following your procedure. If we do not reach you, we will leave a message.  However, if you are feeling well and you are not experiencing any problems, there is no need to return our call.  We will assume that you have returned to your regular daily activities without incident.  If any biopsies were taken you will be contacted by phone or by letter within the next 1-3 weeks.  Please call us at [phone] if you have not heard about  the biopsies in 3 weeks.    SIGNATURES/CONFIDENTIALITY: You and/or your care partner have signed paperwork which will be entered into your electronic medical record.  These signatures attest to the fact that that the information above on your After Visit Summary has been reviewed and is understood.  Full responsibility of the confidentiality of this discharge information lies with you and/or your care-partner.

## 2015-04-04 ENCOUNTER — Telehealth: Payer: Self-pay | Admitting: *Deleted

## 2015-04-04 NOTE — Telephone Encounter (Signed)
  Follow up Call-  Call back number 04/03/2015  Post procedure Call Back phone  # [phone]  Permission to leave phone message Yes     Patient questions:  Do you have a fever, pain , or abdominal swelling? No. Pain Score  0 *  Have you tolerated food without any problems? Yes.    Have you been able to return to your normal activities? Yes.    Do you have any questions about your discharge instructions: Diet   No. Medications  No. Follow up visit  No.  Do you have questions or concerns about your Care? No.  Actions: * If pain score is 4 or above: No action needed, pain <4.

## 2015-04-10 ENCOUNTER — Encounter: Payer: Self-pay | Admitting: Internal Medicine

## 2015-04-13 ENCOUNTER — Encounter: Payer: Self-pay | Admitting: Internal Medicine

## 2015-07-23 ENCOUNTER — Encounter: Payer: Self-pay | Admitting: Internal Medicine

## 2015-08-24 ENCOUNTER — Ambulatory Visit (INDEPENDENT_AMBULATORY_CARE_PROVIDER_SITE_OTHER): Payer: Managed Care, Other (non HMO) | Admitting: Internal Medicine

## 2015-08-24 ENCOUNTER — Encounter: Payer: Self-pay | Admitting: Internal Medicine

## 2015-08-24 VITALS — BP 124/84 | HR 55 | Temp 98.2°F | Ht 72.0 in | Wt 190.0 lb

## 2015-08-24 DIAGNOSIS — F1021 Alcohol dependence, in remission: Secondary | ICD-10-CM | POA: Diagnosis not present

## 2015-08-24 DIAGNOSIS — G250 Essential tremor: Secondary | ICD-10-CM | POA: Diagnosis not present

## 2015-08-24 DIAGNOSIS — Z0000 Encounter for general adult medical examination without abnormal findings: Secondary | ICD-10-CM | POA: Diagnosis not present

## 2015-08-24 DIAGNOSIS — Z8546 Personal history of malignant neoplasm of prostate: Secondary | ICD-10-CM | POA: Diagnosis not present

## 2015-08-24 LAB — GLUCOSE, RANDOM: Glucose, Bld: 95 mg/dL (ref 70–99)

## 2015-08-24 LAB — PSA: PSA: 0.03 ng/mL — ABNORMAL LOW (ref 0.10–4.00)

## 2015-08-24 NOTE — Assessment & Plan Note (Signed)
Minimal symptoms Hasn't used propranolol in quite some time

## 2015-08-24 NOTE — Progress Notes (Signed)
Subjective:    Patient ID: Juan Holmes, male    DOB: 07-01-1950, 65 y.o.   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 sildenafil (REVATIO) 20 MG tablet Take 20 mg by mouth 3 (three) times daily.    . [DISCONTINUED] vardenafil (LEVITRA) 20 MG tablet Take 20 mg by mouth daily as needed for erectile dysfunction.     No current facility-administered medications on file prior to visit.    Allergies  Allergen Reactions  . Penicillins Hives    Past Medical History  Diagnosis Date  . Benign essential tremor 1998  . Prostate cancer ([place]) 2010    total prostatectomy  . Allergic rhinitis due to pollen   . ED (erectile dysfunction)   . Congenital single kidney   . Colon polyps 2011    adenomatous  . Chronic alcohol abuse 2009  . Angiodysplasia of cecum 04/03/2015    Past Surgical History  Procedure Laterality Date  . Robot assisted laparoscopic radical prostatectomy N/A 2010    [facility] urology   . Knee arthroscopy Right 2012  . Vasectomy  1977  . Colonoscopy      Family History  Problem Relation Age of Onset  . Cancer Mother   . Heart disease Mother   . Stroke Father   . Hyperlipidemia Brother   . Hypertension Brother   . Diabetes Neg Hx   . Colon cancer Neg Hx   . Rectal cancer Neg Hx   . Stomach cancer Neg Hx   . Hyperlipidemia Brother   . Hypertension Brother   . Hyperlipidemia Brother   . Hypertension Brother   . Hyperlipidemia Brother   . Hypertension Brother     Social History   Social History  . Marital Status: Married    Spouse Name: N/A  . Number of Children: 2  . Years of Education: N/A   Occupational History  . Charity fundraiser     Retired  . Mantoloking History Main Topics  . Smoking status: Former Smoker    Quit date: 02/02/2015  . Smokeless tobacco: Never Used  . Alcohol Use: No  . Drug Use: No  . Sexual Activity: Not on file   Other Topics Concern  . Not on file   Social History Narrative   Review of Systems  Constitutional: Negative for fatigue and unexpected weight change.       Wears seat belt  HENT: Negative for dental problem, hearing loss and tinnitus.        Keeps up with dentist  Eyes: Negative for visual disturbance.       No diplopia or unilateral vision loss  Respiratory: Negative for cough, chest tightness and shortness of breath.   Cardiovascular: Negative for chest pain, palpitations and leg swelling.  Gastrointestinal: Negative for nausea, vomiting, abdominal pain, constipation and blood in stool.       No heartburn  Endocrine: Negative for polydipsia and polyuria.  Genitourinary: Negative for urgency, frequency and difficulty urinating.       No sexual problems  Musculoskeletal: Negative for back pain, joint swelling and arthralgias.  Skin: Negative for rash.       No  suspicious lesions. Derm yearly  Allergic/Immunologic: Positive for environmental allergies. Negative for immunocompromised state.       Hasn't needed meds  Neurological: Negative for dizziness, syncope, weakness, light-headedness and headaches.  Hematological: Negative for adenopathy. Does not bruise/bleed easily.  Psychiatric/Behavioral: Negative for sleep disturbance and dysphoric mood. The patient is not nervous/anxious.        Objective:   Physical Exam  Constitutional: He is oriented to person, place, and time. He appears well-developed and well-nourished. No distress.  HENT:  Head: Normocephalic and atraumatic.  Right Ear: External ear normal.  Left Ear: External ear normal.  Mouth/Throat: Oropharynx is clear and moist. No oropharyngeal exudate.  Eyes: Conjunctivae are normal. Pupils are  equal, round, and reactive to light.  Neck: Normal range of motion. Neck supple. No thyromegaly present.  Cardiovascular: Normal rate, regular rhythm, normal heart sounds and intact distal pulses.  Exam reveals no gallop.   No murmur heard. Pulmonary/Chest: Effort normal and breath sounds normal. No respiratory distress. He has no wheezes. He has no rales.  Abdominal: Soft. There is no tenderness.  Musculoskeletal: He exhibits no edema or tenderness.  Lymphadenopathy:    He has no cervical adenopathy.  Neurological: He is oriented to person, place, and time.  Skin: No rash noted. No erythema.  Psychiatric: He has a normal mood and affect. His behavior is normal.          Assessment & Plan:

## 2015-08-24 NOTE — Assessment & Plan Note (Signed)
Will recheck PSA

## 2015-08-24 NOTE — Assessment & Plan Note (Signed)
Healthy Colon due again in 2022 Discussed exercise Recommended yearly flu vaccine

## 2015-08-24 NOTE — Addendum Note (Signed)
Addended by: Marchia Bond on: 08/24/2015 09:57 AM   Modules accepted: Miquel Dunn

## 2015-08-24 NOTE — Assessment & Plan Note (Signed)
Remains abstinent 

## 2015-08-24 NOTE — Progress Notes (Signed)
Pre visit review using our clinic review tool, if applicable. No additional management support is needed unless otherwise documented below in the visit note. 

## 2015-09-20 ENCOUNTER — Encounter: Payer: Self-pay | Admitting: Internal Medicine

## 2015-12-03 ENCOUNTER — Encounter: Payer: Self-pay | Admitting: Internal Medicine

## 2015-12-24 ENCOUNTER — Other Ambulatory Visit: Payer: Self-pay | Admitting: Internal Medicine

## 2016-05-25 ENCOUNTER — Encounter: Payer: Self-pay | Admitting: Internal Medicine

## 2016-05-26 NOTE — Telephone Encounter (Signed)
Please call him if I have a cancellation

## 2016-06-16 ENCOUNTER — Emergency Department
Admission: EM | Admit: 2016-06-16 | Discharge: 2016-06-16 | Disposition: A | Discharge status: Home / Self Care | Payer: Medicare Other | Attending: Emergency Medicine | Admitting: Emergency Medicine

## 2016-06-16 DIAGNOSIS — L299 Pruritus, unspecified: Secondary | ICD-10-CM | POA: Diagnosis not present

## 2016-06-16 DIAGNOSIS — Z8546 Personal history of malignant neoplasm of prostate: Secondary | ICD-10-CM | POA: Insufficient documentation

## 2016-06-16 DIAGNOSIS — Z87891 Personal history of nicotine dependence: Secondary | ICD-10-CM | POA: Insufficient documentation

## 2016-06-16 MED ORDER — PREDNISONE 20 MG PO TABS
60.0000 mg | ORAL_TABLET | Freq: Once | ORAL | Status: AC
Start: 2016-06-16 — End: 2016-06-16
  Administered 2016-06-16: 60 mg via ORAL
  Filled 2016-06-16: qty 3

## 2016-06-16 MED ORDER — DIPHENHYDRAMINE HCL 25 MG PO CAPS
25.0000 mg | ORAL_CAPSULE | Freq: Once | ORAL | Status: AC
  Administered 2016-06-16: 25 mg via ORAL
  Filled 2016-06-16: qty 1

## 2016-06-16 NOTE — ED Notes (Signed)
Pt. States at around 5:30 this morning pt. Woke with nausea and itching over entire body.  Pt. States he was red all over.  Pt. States now no itching and no rash.  Pt. Denies nausea at this time.

## 2016-06-16 NOTE — ED Triage Notes (Signed)
Patient reports woke feeling hot and itching.  Reports had similar symptoms several years ago and became concerned he may be having another allergic reaction.  Patient with redness noted to trunk.

## 2016-06-16 NOTE — ED Provider Notes (Signed)
[facility] Emergency Department Provider Note       Time seen: ----------------------------------------- 7:25 AM on 06/16/2016 -----------------------------------------     I have reviewed the triage vital signs and the nursing notes.   HISTORY   Chief Complaint Allergic Reaction    HPI Juan Holmes is a 66 y.o. male who presents to the ED for feeling hot and itching. Patient reports similar episodes 5 years ago where he became anaphylactic. During that episode he was unsure of the cause of his reaction. He has had changes in his soaps or detergents recently. He denies any insect bites, changes in his diet or other complaints. He does have seasonal allergy. Other than itching he has not had any other reactions.   Past Medical History:  Diagnosis Date  . Allergic rhinitis due to pollen   . Angiodysplasia of cecum 04/03/2015  . Benign essential tremor 1998  . Chronic alcohol abuse 2009  . Colon polyps 2011   adenomatous  . Congenital single kidney   . ED (erectile dysfunction)   . Prostate cancer [facility]) 2010   total prostatectomy    Patient Active Problem List   Diagnosis Date Noted  . Personal history of colonic polyps 04/03/2015  . Angiodysplasia of cecum 04/03/2015  . Hyperlipemia 08/11/2014  . Alcohol dependence in remission ([place])   . Routine general medical examination at a health care facility 08/31/2012  . Benign essential tremor   . History of prostate cancer   . Allergic rhinitis due to pollen   . ED (erectile dysfunction)   . Congenital single kidney     Past Surgical History:  Procedure Laterality Date  . COLONOSCOPY    . KNEE ARTHROSCOPY Right 2012  . [place] N/A 2010   [facility] urology   . VASECTOMY  1977    Allergies Penicillins  Social History Social History  Substance Use Topics  . Smoking status: Former Smoker    Quit date: 02/02/2015  . Smokeless tobacco: Never Used   . Alcohol use No    Review of Systems Constitutional: Negative for fever. Cardiovascular: Negative for chest pain. Respiratory: Negative for shortness of breath. Gastrointestinal: Negative for abdominal pain, vomiting and diarrhea. Musculoskeletal: Negative for back pain. Skin: Positive for itching Neurological: Negative for headaches, focal weakness or numbness.  10-point ROS otherwise negative.  ____________________________________________   PHYSICAL EXAM:  VITAL SIGNS: ED Triage Vitals  Enc Vitals Group     BP 06/16/16 0627 126/78     Pulse Rate 06/16/16 0627 (!) 47     Resp 06/16/16 0627 20     Temp 06/16/16 0627 97.5 F (36.4 C)     Temp Source 06/16/16 0627 Oral     SpO2 06/16/16 0627 96 %     Weight 06/16/16 0628 180 lb (81.6 kg)     Height 06/16/16 0628 6\' 1"  (1.854 m)     Head Circumference --      Peak Flow --      Pain Score --      Pain Loc --      Pain Edu? --      Excl. in [place]? --     Constitutional: Alert and oriented. Well appearing and in no distress. Eyes: Conjunctivae are normal. PERRL. Normal extraocular movements. ENT   Head: Normocephalic and atraumatic.   Nose: No congestion/rhinnorhea.   Mouth/Throat: Mucous membranes are moist.   Neck: No stridor. Cardiovascular: Normal rate, regular rhythm. No murmurs, rubs, or  gallops. Respiratory: Normal respiratory effort without tachypnea nor retractions. Breath sounds are clear and equal bilaterally. No wheezes/rales/rhonchi. Gastrointestinal: Soft and nontender. Normal bowel sounds Musculoskeletal: Nontender with normal range of motion in extremities. No lower extremity tenderness nor edema. Neurologic:  Normal speech and language. No gross focal neurologic deficits are appreciated.  Skin:  Skin is warm, dry and intact. No rash noted. Psychiatric: Mood and affect are normal. Speech and behavior are normal.  ____________________________________________  ED COURSE:  Pertinent labs &  imaging results that were available during my care of the patient were reviewed by me and considered in my medical decision making (see chart for details). Patient presents for Pruritus, we will give oral Benadryl and steroids and observed.   Procedures ___________________________________________  FINAL ASSESSMENT AND PLAN  Pruritus  Plan: Patient had presented for itching, he was given oral Benadryl and steroids with resolution. No specific etiology is given. Advised Benadryl as needed for itching. To this point he has not had any hives, itching or anaphylactic symptoms.   Earleen Newport, MD   Note: This note was generated in part or whole with voice recognition software. Voice recognition is usually quite accurate but there are transcription errors that can and very often do occur. I apologize for any typographical errors that were not detected and corrected.     Earleen Newport, MD 06/16/16 [phone]

## 2016-06-17 ENCOUNTER — Telehealth: Payer: Self-pay

## 2016-06-17 NOTE — Telephone Encounter (Signed)
Left a message for pt to call. Following up on recent ER Visit on 06-16-16 for allergic reaction. Just want to see how he is doing

## 2016-06-19 NOTE — Telephone Encounter (Signed)
Patient returned Shannon's call.  Patient said he's feeling fine. °

## 2016-09-11 ENCOUNTER — Encounter: Payer: Medicare Other | Admitting: Internal Medicine

## 2016-11-20 ENCOUNTER — Telehealth: Payer: Self-pay

## 2016-11-20 MED ORDER — PROPRANOLOL HCL 40 MG PO TABS: 40.0000 mg | ORAL_TABLET | Freq: Three times a day (TID) | ORAL | 2 refills | Status: DC

## 2016-11-20 NOTE — Telephone Encounter (Signed)
Pt left v/m requesting refill propranolol (last refilled # 60 x 11 on 08/31/12) pt has not had to take propranolol for a long time but pt has started again with benign tremors in rt hand. Pt knows he is taking a chance but pt request to refill propranolol to [facility]. Last seen 08/24/15 annual exam; no future appt scheduled. Please advise.

## 2016-11-20 NOTE — Telephone Encounter (Signed)
Okay to fill propranolol 40mg  #60 x 2 Bid prn for tremor Have him set up welcome to Medicare visit soon

## 2016-11-20 NOTE — Telephone Encounter (Signed)
Spoke to pt. Rx sent to pharmacy. 

## 2016-11-27 LAB — LIPID PANEL
Cholesterol: 221 — AB (ref 0–200)
HDL: 82 — AB (ref 35–70)
LDL CALC: 114
TRIGLYCERIDES: 127 (ref 40–160)

## 2016-11-27 LAB — PSA: PSA: 0.13

## 2016-11-27 LAB — BASIC METABOLIC PANEL: GLUCOSE: 102

## 2017-06-15 ENCOUNTER — Other Ambulatory Visit: Payer: Self-pay | Admitting: Internal Medicine

## 2017-11-17 ENCOUNTER — Ambulatory Visit (INDEPENDENT_AMBULATORY_CARE_PROVIDER_SITE_OTHER): Payer: 59 | Admitting: Internal Medicine

## 2017-11-17 ENCOUNTER — Encounter: Payer: Self-pay | Admitting: Internal Medicine

## 2017-11-17 VITALS — BP 128/80 | HR 53 | Temp 98.1°F | Ht 72.0 in | Wt 182.0 lb

## 2017-11-17 DIAGNOSIS — Z8546 Personal history of malignant neoplasm of prostate: Secondary | ICD-10-CM | POA: Diagnosis not present

## 2017-11-17 DIAGNOSIS — Z0000 Encounter for general adult medical examination without abnormal findings: Secondary | ICD-10-CM

## 2017-11-17 DIAGNOSIS — Z23 Encounter for immunization: Secondary | ICD-10-CM | POA: Diagnosis not present

## 2017-11-17 DIAGNOSIS — G250 Essential tremor: Secondary | ICD-10-CM | POA: Diagnosis not present

## 2017-11-17 LAB — LIPID PANEL
CHOLESTEROL: 210 mg/dL — AB (ref 0–200)
HDL: 65.2 mg/dL (ref >39.00)
LDL CALC: 124 mg/dL — AB (ref 0–99)
NonHDL: 144.39
Total CHOL/HDL Ratio: 3
Triglycerides: 101 mg/dL (ref 0.0–149.0)
VLDL: 20.2 mg/dL (ref 0.0–40.0)

## 2017-11-17 LAB — GLUCOSE, RANDOM: GLUCOSE: 103 mg/dL — AB (ref 70–99)

## 2017-11-17 MED ORDER — FLUTICASONE PROPIONATE 50 MCG/ACT NA SUSP: 2.0000 | Freq: Every day | NASAL | 12 refills | Status: DC

## 2017-11-17 NOTE — Assessment & Plan Note (Signed)
Uses the propranolol rarely

## 2017-11-17 NOTE — Assessment & Plan Note (Signed)
Doing well Had PSA recently--he will get me copy Colon due 2022 Prevnar and flu vaccines today

## 2017-11-17 NOTE — Assessment & Plan Note (Signed)
Recent PSA fine

## 2017-11-17 NOTE — Addendum Note (Signed)
Addended by: Pilar Grammes on: 11/17/2017 10:15 AM   Modules accepted: Orders

## 2017-11-17 NOTE — Progress Notes (Signed)
Subjective:    Patient ID: Juan Holmes, male    DOB: February 22, 1951, 67 y.o.   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  Has a growth by his anus Goes back 3 years ago Wonders if it needs further evaluation or excision  Uses the propranolol prn for tremor  Remains abstinent from alcohol No longer goes to [employer]  Current Outpatient Medications on File Prior to Visit  Medication Sig Dispense Refill  . propranolol (INDERAL) 40 MG tablet Take 1 tablet (40 mg total) by mouth 3 (three) times daily. (Patient taking differently: Take 40 mg by mouth 3 (three) times daily as needed. ) 60 tablet 2  . sildenafil (REVATIO) 20 MG tablet TAKE 3 TO 5 TABLETS BY MOUTH DAILY AS NEEDED 50 tablet 5  . [DISCONTINUED] vardenafil (LEVITRA) 20 MG tablet Take 20 mg by mouth daily as needed for erectile dysfunction.     No current facility-administered medications on file prior to visit.     Allergies  Allergen Reactions  . Penicillins Hives    Past Medical History:  Diagnosis Date  . Allergic rhinitis due to pollen   . Angiodysplasia of cecum 04/03/2015  . Benign essential tremor 1998  . Chronic alcohol abuse 2009  . Colon polyps 2011   adenomatous  . Congenital single kidney   . ED (erectile dysfunction)   . Prostate cancer [facility]) 2010   total prostatectomy    Past Surgical History:  Procedure Laterality Date  . COLONOSCOPY    . KNEE ARTHROSCOPY Right 2012  . [place] N/A 2010   [facility] urology   . VASECTOMY  1977    Family History  Problem Relation Age of Onset  . Cancer Mother   . Heart disease Mother   . Stroke Father   . Hyperlipidemia Brother   . Hypertension Brother   . Hyperlipidemia Brother   . Hypertension Brother   . Hyperlipidemia Brother   . Hypertension Brother   . Hyperlipidemia Brother   . Hypertension Brother   . Diabetes Neg Hx   .  Colon cancer Neg Hx   . Rectal cancer Neg Hx   . Stomach cancer Neg Hx     Social History   Socioeconomic History  . Marital status: Divorced    Spouse name: Not on file  . Number of children: 2  . Years of education: Not on file  . Highest education level: Not on file  Occupational History  . Occupation: Charity fundraiser    Comment: Retired  . Occupation: Pension scheme manager    Comment: [employer]  Social Needs  . Financial resource strain: Not on file  . Food insecurity:    Worry: Not on file    Inability: Not on file  . Transportation needs:    Medical: Not on file    Non-medical: Not on file  Tobacco Use  . Smoking status: Former Smoker    Last attempt to quit: 02/02/2015    Years since quitting: 2.7  . Smokeless tobacco: Never Used  Substance and Sexual Activity  . Alcohol use: No    Alcohol/week: 0.0 standard drinks  . Drug use: No  . Sexual activity: Not on file  Lifestyle  . Physical activity:    Days per week: Not on file    Minutes per session: Not on file  . Stress: Not on file  Relationships  .  Social connections:    Talks on phone: Not on file    Gets together: Not on file    Attends religious service: Not on file    Active member of club or organization: Not on file    Attends meetings of clubs or organizations: Not on file    Relationship status: Not on file  . Intimate partner violence:    Fear of current or ex partner: Not on file    Emotionally abused: Not on file    Physically abused: Not on file    Forced sexual activity: Not on file  Other Topics Concern  . Not on file  Social History Narrative   Divorced long ago   Engaged for 2020      Has living will   [name] should make decisions   Would accept resuscitation   Would accept tube feeds short term--- but not if cognitively unaware   Review of Systems  Constitutional: Negative for fatigue and unexpected weight change.       Hasn't been exercising---starting again now Wears  seat belt  HENT: Negative for dental problem, hearing loss and tinnitus.        Keeps up with dentist--recent new crown  Eyes: Negative for visual disturbance.       No diplopia or unilateral vision loss Early cataract  Respiratory: Negative for cough, chest tightness and shortness of breath.   Cardiovascular: Negative for chest pain, palpitations and leg swelling.  Gastrointestinal: Negative for blood in stool and constipation.       No heartburn  Endocrine: Negative for polydipsia and polyuria.  Genitourinary: Negative for difficulty urinating and urgency.       Sildenafil works but gives headache (even just 20mg )  Musculoskeletal: Negative for arthralgias, back pain and joint swelling.       Some left sciatica--- massage helps Does see chiropractor regularly for preventative  Skin: Negative for rash.       Trouble with chiggers of late  Allergic/Immunologic: Negative for environmental allergies and immunocompromised state.  Neurological: Negative for dizziness, syncope, light-headedness and headaches.  Hematological: Negative for adenopathy. Does not bruise/bleed easily.  Psychiatric/Behavioral: Negative for dysphoric mood. The patient is not nervous/anxious.        Sleeps "fair"---- 6-7 hours       Objective:   Physical Exam  Constitutional: He is oriented to person, place, and time. He appears well-developed. No distress.  HENT:  Head: Normocephalic and atraumatic.  Right Ear: External ear normal.  Left Ear: External ear normal.  Mouth/Throat: Oropharynx is clear and moist. No oropharyngeal exudate.  Eyes: Pupils are equal, round, and reactive to light. Conjunctivae are normal.  Neck: No thyromegaly present.  Cardiovascular: Normal rate, regular rhythm, normal heart sounds and intact distal pulses. Exam reveals no gallop.  No murmur heard. Respiratory: Effort normal and breath sounds normal. No respiratory distress. He has no wheezes. He has no rales.  GI: Soft. There is  no tenderness.  Genitourinary:  Genitourinary Comments: Small cyst to the right of rectum  Musculoskeletal: He exhibits no edema or tenderness.  Lymphadenopathy:    He has no cervical adenopathy.  Neurological: He is alert and oriented to person, place, and time.  Skin: No rash noted. No erythema.  Psychiatric: He has a normal mood and affect. His behavior is normal.           Assessment & Plan:

## 2017-11-18 ENCOUNTER — Encounter: Payer: Self-pay | Admitting: Internal Medicine

## 2018-03-11 ENCOUNTER — Other Ambulatory Visit: Payer: Self-pay | Admitting: Internal Medicine

## 2018-06-30 ENCOUNTER — Ambulatory Visit: Payer: 59 | Admitting: Podiatry

## 2018-06-30 ENCOUNTER — Encounter: Payer: Self-pay | Admitting: Podiatry

## 2018-06-30 ENCOUNTER — Other Ambulatory Visit: Payer: Self-pay

## 2018-06-30 VITALS — Temp 96.8°F

## 2018-06-30 DIAGNOSIS — L600 Ingrowing nail: Secondary | ICD-10-CM

## 2018-06-30 MED ORDER — NEOMYCIN-POLYMYXIN-HC 1 % OT SOLN: OTIC | 1 refills | Status: DC

## 2018-06-30 NOTE — Progress Notes (Signed)
  Subjective:  Patient ID: Juan Holmes, male    DOB: December 20, 1950,  MRN: [id number] HPI Chief Complaint  Patient presents with  . Toe Pain    Hallux left - medial border, tender x months, intermittent pain, tried trimming-concerned about discoloration in toenails  . New Patient (Initial Visit)    Est pt 97    68 y.o. male presents with the above complaint.   ROS: Denies fever chills nausea vomiting muscle aches pains calf pain back pain chest pain shortness of breath.  Past Medical History:  Diagnosis Date  . Allergic rhinitis due to pollen   . Angiodysplasia of cecum 04/03/2015  . Benign essential tremor 1998  . Chronic alcohol abuse 2009  . Colon polyps 2011   adenomatous  . Congenital single kidney   . ED (erectile dysfunction)   . Prostate cancer [facility]) 2010   total prostatectomy   Past Surgical History:  Procedure Laterality Date  . COLONOSCOPY    . KNEE ARTHROSCOPY Right 2012  . [place] N/A 2010   [facility] urology   . VASECTOMY  1977    Current Outpatient Medications:  .  NEOMYCIN-POLYMYXIN-HYDROCORTISONE (CORTISPORIN) 1 % SOLN OTIC solution, Apply 1-2 drops to toe BID after soaking, Disp: 10 mL, Rfl: 1 .  propranolol (INDERAL) 40 MG tablet, TAKE 1 TABLET BY MOUTH 3 TIMES DAILY, Disp: 60 tablet, Rfl: 11 .  sildenafil (REVATIO) 20 MG tablet, TAKE 3 TO 5 TABLETS BY MOUTH DAILY AS NEEDED, Disp: 50 tablet, Rfl: 5  Allergies  Allergen Reactions  . Penicillins Hives   Review of Systems Objective:   Vitals:   06/30/18 0955  Temp: (!) 96.8 F (36 C)    General: Well developed, nourished, in no acute distress, alert and oriented x3   Dermatological: Skin is warm, dry and supple bilateral. Nails x 10 are well maintained; remaining integument appears unremarkable at this time. There are no open sores, no preulcerative lesions, no rash or signs of infection present.  Sharp incurvated nail margin tibial border hallux left no  erythema edema cellulitis drainage or odor.  Moderate pain on palpation.  Vascular: Dorsalis Pedis artery and Posterior Tibial artery pedal pulses are 2/4 bilateral with immedate capillary fill time. Pedal hair growth present. No varicosities and no lower extremity edema present bilateral.   Neruologic: Grossly intact via light touch bilateral. Vibratory intact via tuning fork bilateral. Protective threshold with Semmes Wienstein monofilament intact to all pedal sites bilateral. Patellar and Achilles deep tendon reflexes 2+ bilateral. No Babinski or clonus noted bilateral.   Musculoskeletal: No gross boney pedal deformities bilateral. No pain, crepitus, or limitation noted with foot and ankle range of motion bilateral. Muscular strength 5/5 in all groups tested bilateral.  Gait: Unassisted, Nonantalgic.    Radiographs:  None taken  Assessment & Plan:   Assessment: Ingrown toenail tibial border hallux left  Plan: Discussed etiology pathology conservative versus surgical therapies.  At this point chemical matrixectomy was performed to the tibial border hallux left.  He tolerated this procedure well after local anesthetic was administered 3 cc 50-50 mixture of Marcaine plain lidocaine plain was infiltrated in a hallux block.  Tolerated procedure well without complications was given both oral and written home-going instructions for care and soaking of the toe as well as a prescription for Corticosporin otic to be applied twice daily after soaking.  I will follow-up with him in a couple of weeks.     Lawayne Hartig T. [place], [place]

## 2018-06-30 NOTE — Patient Instructions (Signed)

## 2018-07-05 ENCOUNTER — Encounter: Payer: Self-pay | Admitting: Podiatry

## 2018-07-19 ENCOUNTER — Ambulatory Visit: Payer: 59 | Admitting: Podiatry

## 2018-07-19 ENCOUNTER — Encounter: Payer: Self-pay | Admitting: Podiatry

## 2018-07-19 ENCOUNTER — Other Ambulatory Visit: Payer: Self-pay

## 2018-07-19 DIAGNOSIS — L600 Ingrowing nail: Secondary | ICD-10-CM

## 2018-07-19 DIAGNOSIS — Z9889 Other specified postprocedural states: Secondary | ICD-10-CM

## 2018-07-19 NOTE — Progress Notes (Signed)
He presents today for follow-up of his matrixectomy left foot.  He states that is doing really well he continues to soak regularly and apply his Cortisporin Otic drops.  Objective: Vital signs are stable he is alert and oriented x3.  Incision site and surgical site of the hallux left tibial border appears to be perfectly normal there is a small area of scabbing noted I cleaned the area today with a swab and peroxide I see no signs of infection whatsoever no erythema cellulitis drainage odor.  Assessment: Well-healing surgical toe.  Plan: Continue to soak until completely resolved.  Follow-up with me with any changes.

## 2018-11-24 ENCOUNTER — Encounter

## 2018-11-24 ENCOUNTER — Other Ambulatory Visit: Payer: Self-pay

## 2018-11-24 ENCOUNTER — Encounter: Payer: Self-pay | Admitting: Internal Medicine

## 2018-11-24 ENCOUNTER — Ambulatory Visit (INDEPENDENT_AMBULATORY_CARE_PROVIDER_SITE_OTHER): Payer: 59 | Admitting: Internal Medicine

## 2018-11-24 VITALS — BP 124/80 | HR 50 | Temp 98.2°F | Ht 72.0 in | Wt 186.0 lb

## 2018-11-24 DIAGNOSIS — Z23 Encounter for immunization: Secondary | ICD-10-CM

## 2018-11-24 DIAGNOSIS — G250 Essential tremor: Secondary | ICD-10-CM | POA: Diagnosis not present

## 2018-11-24 DIAGNOSIS — Z0000 Encounter for general adult medical examination without abnormal findings: Secondary | ICD-10-CM

## 2018-11-24 DIAGNOSIS — Z8546 Personal history of malignant neoplasm of prostate: Secondary | ICD-10-CM

## 2018-11-24 DIAGNOSIS — E785 Hyperlipidemia, unspecified: Secondary | ICD-10-CM

## 2018-11-24 DIAGNOSIS — F1021 Alcohol dependence, in remission: Secondary | ICD-10-CM | POA: Diagnosis not present

## 2018-11-24 LAB — COMPREHENSIVE METABOLIC PANEL
ALT: 16 U/L (ref 0–53)
AST: 8 U/L (ref 0–37)
Albumin: 4.3 g/dL (ref 3.5–5.2)
Alkaline Phosphatase: 63 U/L (ref 39–117)
BUN: 13 mg/dL (ref 6–23)
CO2: 29 mEq/L (ref 19–32)
Calcium: 9.2 mg/dL (ref 8.4–10.5)
Chloride: 102 mEq/L (ref 96–112)
Creatinine, Ser: 0.99 mg/dL (ref 0.40–1.50)
GFR: 75.28 mL/min (ref >60.00)
Glucose, Bld: 102 mg/dL — ABNORMAL HIGH (ref 70–99)
Potassium: 4.7 mEq/L (ref 3.5–5.1)
Sodium: 138 mEq/L (ref 135–145)
Total Bilirubin: 0.7 mg/dL (ref 0.2–1.2)
Total Protein: 6.6 g/dL (ref 6.0–8.3)

## 2018-11-24 LAB — CBC
HCT: 45 % (ref 39.0–52.0)
Hemoglobin: 15.1 g/dL (ref 13.0–17.0)
MCHC: 33.5 g/dL (ref 30.0–36.0)
MCV: 89.6 fl (ref 78.0–100.0)
Platelets: 219 10*3/uL (ref 150.0–400.0)
RBC: 5.02 Mil/uL (ref 4.22–5.81)
RDW: 14.2 % (ref 11.5–15.5)
WBC: 6 10*3/uL (ref 4.0–10.5)

## 2018-11-24 LAB — LIPID PANEL
Cholesterol: 207 mg/dL — ABNORMAL HIGH (ref 0–200)
HDL: 58.1 mg/dL (ref >39.00)
LDL Cholesterol: 123 mg/dL — ABNORMAL HIGH (ref 0–99)
NonHDL: 148.74
Total CHOL/HDL Ratio: 4
Triglycerides: 128 mg/dL (ref 0.0–149.0)
VLDL: 25.6 mg/dL (ref 0.0–40.0)

## 2018-11-24 LAB — PSA: PSA: 0.06 ng/mL — ABNORMAL LOW (ref 0.10–4.00)

## 2018-11-24 MED ORDER — PROPRANOLOL HCL 40 MG PO TABS: 40.0000 mg | ORAL_TABLET | Freq: Three times a day (TID) | ORAL | 5 refills | Status: DC

## 2018-11-24 NOTE — Assessment & Plan Note (Signed)
Healthy Colon due 2022 Doing well with fitness Pneumovax and flu vaccines today

## 2018-11-24 NOTE — Assessment & Plan Note (Signed)
Uses the propranolol prn

## 2018-11-24 NOTE — Addendum Note (Signed)
Addended by: Pilar Grammes on: 11/24/2018 12:57 PM   Modules accepted: Orders

## 2018-11-24 NOTE — Assessment & Plan Note (Signed)
Low risk with high HDL Will check levels

## 2018-11-24 NOTE — Assessment & Plan Note (Signed)
Still abstinent 

## 2018-11-24 NOTE — Progress Notes (Signed)
Subjective:    Patient ID: Juan Holmes, male    DOB: 1950/12/19, 68 y.o.   MRN: [id number]  HPI Here for physical Doing okay Same job---but is going to be retiring ~ March (facility being closed down) Was supposed to be married in August---but is from [place] Barely got back from there with quarantines, etc  Yearly derm visit--did have a biopsy that was negative Trying to work on nutrition and regular exercise Remained abstinent with alcohol  Current Outpatient Medications on File Prior to Visit  Medication Sig Dispense Refill  . propranolol (INDERAL) 40 MG tablet TAKE 1 TABLET BY MOUTH 3 TIMES DAILY 60 tablet 11  . sildenafil (REVATIO) 20 MG tablet TAKE 3 TO 5 TABLETS BY MOUTH DAILY AS NEEDED 50 tablet 5  . [DISCONTINUED] vardenafil (LEVITRA) 20 MG tablet Take 20 mg by mouth daily as needed for erectile dysfunction.     No current facility-administered medications on file prior to visit.     Allergies  Allergen Reactions  . Penicillins Hives    Past Medical History:  Diagnosis Date  . Allergic rhinitis due to pollen   . Angiodysplasia of cecum 04/03/2015  . Benign essential tremor 1998  . Chronic alcohol abuse 2009  . Colon polyps 2011   adenomatous  . Congenital single kidney   . ED (erectile dysfunction)   . Prostate cancer [facility]) 2010   total prostatectomy    Past Surgical History:  Procedure Laterality Date  . COLONOSCOPY    . KNEE ARTHROSCOPY Right 2012  . [place] N/A 2010   [facility] urology   . VASECTOMY  1977    Family History  Problem Relation Age of Onset  . Cancer Mother   . Heart disease Mother   . Stroke Father   . Hyperlipidemia Brother   . Hypertension Brother   . Hyperlipidemia Brother   . Hypertension Brother   . Hyperlipidemia Brother   . Hypertension Brother   . Hyperlipidemia Brother   . Hypertension Brother   . Diabetes Neg Hx   . Colon cancer Neg Hx   . Rectal cancer Neg Hx   .  Stomach cancer Neg Hx     Social History   Socioeconomic History  . Marital status: Divorced    Spouse name: Not on file  . Number of children: 2  . Years of education: Not on file  . Highest education level: Not on file  Occupational History  . Occupation: Charity fundraiser    Comment: Retired  . Occupation: Pension scheme manager    Comment: [employer]  Social Needs  . Financial resource strain: Not on file  . Food insecurity    Worry: Not on file    Inability: Not on file  . Transportation needs    Medical: Not on file    Non-medical: Not on file  Tobacco Use  . Smoking status: Former Smoker    Quit date: 02/02/2015    Years since quitting: 3.8  . Smokeless tobacco: Never Used  Substance and Sexual Activity  . Alcohol use: No    Alcohol/week: 0.0 standard drinks  . Drug use: No  . Sexual activity: Not on file  Lifestyle  . Physical activity    Days per week: Not on file    Minutes per session: Not on file  . Stress: Not on file  Relationships  . Social Herbalist on phone: Not on file    Gets together: Not  on file    Attends religious service: Not on file    Active member of club or organization: Not on file    Attends meetings of clubs or organizations: Not on file    Relationship status: Not on file  . Intimate partner violence    Fear of current or ex partner: Not on file    Emotionally abused: Not on file    Physically abused: Not on file    Forced sexual activity: Not on file  Other Topics Concern  . Not on file  Social History Narrative   Divorced long ago   Engaged for 2020      Has living will   [name] should make decisions   Would accept resuscitation   Would accept tube feeds short term--- but not if cognitively unaware   Review of Systems  Constitutional: Negative for fatigue and unexpected weight change.       Wears seat belt  HENT: Negative for hearing loss and tinnitus.        Keeps up with dentist and periodontist Recent  crown  Eyes: Negative for visual disturbance.       No diplopia or unilateral vision loss  Respiratory: Negative for cough, chest tightness and shortness of breath.   Cardiovascular: Negative for chest pain, palpitations and leg swelling.  Gastrointestinal: Negative for abdominal pain, blood in stool and constipation.       No heartburn  Endocrine: Negative for polydipsia and polyuria.  Genitourinary: Negative for difficulty urinating and urgency.       Rarely uses the sildenafil  Musculoskeletal: Negative for arthralgias, back pain and joint swelling.  Skin: Negative for rash.  Allergic/Immunologic: Positive for environmental allergies. Negative for immunocompromised state.       Doesn't use meds in general  Neurological: Negative for dizziness, syncope, light-headedness and headaches.       Tremor is better--usually takes the propranolol once a day  Hematological: Negative for adenopathy. Does not bruise/bleed easily.  Psychiatric/Behavioral: Negative for dysphoric mood and sleep disturbance. The patient is not nervous/anxious.        Objective:   Physical Exam  Constitutional: He is oriented to person, place, and time. He appears well-developed. No distress.  HENT:  Head: Normocephalic and atraumatic.  Right Ear: External ear normal.  Left Ear: External ear normal.  Mouth/Throat: Oropharynx is clear and moist. No oropharyngeal exudate.  Eyes: Pupils are equal, round, and reactive to light. Conjunctivae are normal.  Neck: No thyromegaly present.  Cardiovascular: Normal rate, regular rhythm, normal heart sounds and intact distal pulses. Exam reveals no gallop.  No murmur heard. Respiratory: Effort normal and breath sounds normal. No respiratory distress. He has no wheezes. He has no rales.  GI: Soft. There is no abdominal tenderness.  Musculoskeletal:        General: No tenderness or edema.  Lymphadenopathy:    He has no cervical adenopathy.  Neurological: He is alert and  oriented to person, place, and time.  Skin: No rash noted. No erythema.  Psychiatric: He has a normal mood and affect. His behavior is normal.           Assessment & Plan:

## 2018-11-24 NOTE — Assessment & Plan Note (Signed)
Will recheck PSA

## 2019-01-26 ENCOUNTER — Other Ambulatory Visit: Payer: Self-pay | Admitting: Internal Medicine

## 2019-01-26 ENCOUNTER — Ambulatory Visit (INDEPENDENT_AMBULATORY_CARE_PROVIDER_SITE_OTHER)
Admission: RE | Admit: 2019-01-26 | Discharge: 2019-01-26 | Disposition: A | Discharge status: Home / Self Care | Payer: 59 | Source: Ambulatory Visit | Attending: Internal Medicine | Admitting: Internal Medicine

## 2019-01-26 ENCOUNTER — Ambulatory Visit (INDEPENDENT_AMBULATORY_CARE_PROVIDER_SITE_OTHER): Payer: 59 | Admitting: Internal Medicine

## 2019-01-26 ENCOUNTER — Encounter: Payer: Self-pay | Admitting: Internal Medicine

## 2019-01-26 ENCOUNTER — Other Ambulatory Visit: Payer: Self-pay

## 2019-01-26 DIAGNOSIS — M79644 Pain in right finger(s): Secondary | ICD-10-CM | POA: Diagnosis not present

## 2019-01-26 NOTE — Progress Notes (Signed)
Subjective:    Patient ID: Juan Holmes, male    DOB: 14-Jul-1950, 68 y.o.   MRN: [id number]  HPI Here due to pain in right 5th finger  Dislocated right 5th finger as child (catching finger) Never told parents Has reinjured several times Notices it mostly with golf grip Wonders about seeing ortho  Hasn't done anything for it Current Outpatient Medications on File Prior to Visit  Medication Sig Dispense Refill  . propranolol (INDERAL) 40 MG tablet Take 1 tablet (40 mg total) by mouth 3 (three) times daily. 60 tablet 5  . sildenafil (REVATIO) 20 MG tablet TAKE 3 TO 5 TABLETS BY MOUTH DAILY AS NEEDED 50 tablet 5  . [DISCONTINUED] vardenafil (LEVITRA) 20 MG tablet Take 20 mg by mouth daily as needed for erectile dysfunction.     No current facility-administered medications on file prior to visit.     Allergies  Allergen Reactions  . Penicillins Hives    Past Medical History:  Diagnosis Date  . Allergic rhinitis due to pollen   . Angiodysplasia of cecum 04/03/2015  . Benign essential tremor 1998  . Chronic alcohol abuse 2009  . Colon polyps 2011   adenomatous  . Congenital single kidney   . ED (erectile dysfunction)   . Prostate cancer [facility]) 2010   total prostatectomy    Past Surgical History:  Procedure Laterality Date  . COLONOSCOPY    . KNEE ARTHROSCOPY Right 2012  . [place] N/A 2010   [facility] urology   . VASECTOMY  1977    Family History  Problem Relation Age of Onset  . Cancer Mother   . Heart disease Mother   . Stroke Father   . Hyperlipidemia Brother   . Hypertension Brother   . Atrial fibrillation Brother   . Hyperlipidemia Brother   . Hypertension Brother   . Diabetes Brother   . Hyperlipidemia Brother   . Hypertension Brother   . Hyperlipidemia Brother   . Hypertension Brother   . Colon cancer Neg Hx   . Rectal cancer Neg Hx   . Stomach cancer Neg Hx     Social History   Socioeconomic History   . Marital status: Divorced    Spouse name: Not on file  . Number of children: 2  . Years of education: Not on file  . Highest education level: Not on file  Occupational History  . Occupation: Charity fundraiser    Comment: Retired  . Occupation: Pension scheme manager    Comment: [employer]  Social Needs  . Financial resource strain: Not on file  . Food insecurity    Worry: Not on file    Inability: Not on file  . Transportation needs    Medical: Not on file    Non-medical: Not on file  Tobacco Use  . Smoking status: Former Smoker    Quit date: 02/02/2015    Years since quitting: 3.9  . Smokeless tobacco: Never Used  Substance and Sexual Activity  . Alcohol use: No    Alcohol/week: 0.0 standard drinks  . Drug use: No  . Sexual activity: Not on file  Lifestyle  . Physical activity    Days per week: Not on file    Minutes per session: Not on file  . Stress: Not on file  Relationships  . Social Herbalist on phone: Not on file    Gets together: Not on file    Attends religious service: Not on  file    Active member of club or organization: Not on file    Attends meetings of clubs or organizations: Not on file    Relationship status: Not on file  . Intimate partner violence    Fear of current or ex partner: Not on file    Emotionally abused: Not on file    Physically abused: Not on file    Forced sexual activity: Not on file  Other Topics Concern  . Not on file  Social History Narrative   Divorced long ago   Engaged for 2020      Has living will   [name] should make decisions   Would accept resuscitation   Would accept tube feeds short term--- but not if cognitively unaware   Review of Systems No fever No other joint issues    Objective:   Physical Exam  Constitutional: He appears well-developed. No distress.  Musculoskeletal:     Comments: Swelling of PIP right 5th finger--mild tenderness Just about full extension. Full flexion. Mild decreased  strength in that finger           Assessment & Plan:

## 2019-01-26 NOTE — Patient Instructions (Signed)
Please try over the counter diclofenac gel for your finger. (or try the salon pas patch)

## 2019-01-26 NOTE — Assessment & Plan Note (Signed)
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

## 2019-03-15 ENCOUNTER — Ambulatory Visit: Payer: 59 | Admitting: Podiatry

## 2019-03-25 ENCOUNTER — Ambulatory Visit: Payer: 59 | Attending: Internal Medicine

## 2019-03-25 DIAGNOSIS — Z20822 Contact with and (suspected) exposure to covid-19: Secondary | ICD-10-CM

## 2019-03-26 LAB — NOVEL CORONAVIRUS, NAA: SARS-CoV-2, NAA: DETECTED — AB

## 2019-03-27 ENCOUNTER — Telehealth: Payer: Self-pay | Admitting: Infectious Diseases

## 2019-03-27 NOTE — Telephone Encounter (Signed)
Called to discuss with patient about Covid symptoms and the use of bamlanivimab, a monoclonal antibody infusion for those with mild to moderate Covid symptoms and at a high risk of hospitalization.  Pt is qualified for this infusion at the [facility] infusion center due to Age > 65   He does not quality as he is not having any symptoms. He has not been around anyone that has had known COVID disease and has not had any changes to his health or mild illnesses over the last 1-2 months at all.   Isolation precautions discussed. Will also send information to MyChart re: vaccination recommendations as he is interested in getting vaccinated.

## 2019-03-28 ENCOUNTER — Ambulatory Visit: Payer: 59 | Attending: Internal Medicine

## 2019-06-23 ENCOUNTER — Other Ambulatory Visit: Payer: Self-pay

## 2019-06-23 ENCOUNTER — Ambulatory Visit (INDEPENDENT_AMBULATORY_CARE_PROVIDER_SITE_OTHER)
Admission: RE | Admit: 2019-06-23 | Discharge: 2019-06-23 | Disposition: A | Discharge status: Home / Self Care | Payer: Medicare Other | Source: Ambulatory Visit | Attending: Internal Medicine | Admitting: Internal Medicine

## 2019-06-23 ENCOUNTER — Encounter: Payer: Self-pay | Admitting: Internal Medicine

## 2019-06-23 ENCOUNTER — Ambulatory Visit (INDEPENDENT_AMBULATORY_CARE_PROVIDER_SITE_OTHER): Payer: Medicare Other | Admitting: Internal Medicine

## 2019-06-23 DIAGNOSIS — R2241 Localized swelling, mass and lump, right lower limb: Secondary | ICD-10-CM | POA: Diagnosis not present

## 2019-06-23 DIAGNOSIS — M1711 Unilateral primary osteoarthritis, right knee: Secondary | ICD-10-CM | POA: Diagnosis not present

## 2019-06-23 NOTE — Progress Notes (Signed)
Subjective:    Patient ID: Juan Holmes, male    DOB: 11/19/1950, 69 y.o.   MRN: [id number]  HPI Here due to lump on his right leg This visit occurred during the SARS-CoV-2 public health emergency.  Safety protocols were in place, including screening questions prior to the visit, additional usage of staff PPE, and extensive cleaning of exam room while observing appropriate contact time as indicated for disinfecting solutions.   Has mass on anterolateral right calf Very close to the middle Goes back ~2 years but clearly growing lately No pain but can feel it due to the size  Current Outpatient Medications on File Prior to Visit  Medication Sig Dispense Refill  . propranolol (INDERAL) 40 MG tablet Take 1 tablet (40 mg total) by mouth 3 (three) times daily. 60 tablet 5  . sildenafil (REVATIO) 20 MG tablet TAKE 3 TO 5 TABLETS BY MOUTH DAILY AS NEEDED 50 tablet 11  . [DISCONTINUED] vardenafil (LEVITRA) 20 MG tablet Take 20 mg by mouth daily as needed for erectile dysfunction.     No current facility-administered medications on file prior to visit.    Allergies  Allergen Reactions  . Penicillins Hives    Past Medical History:  Diagnosis Date  . Allergic rhinitis due to pollen   . Angiodysplasia of cecum 04/03/2015  . Benign essential tremor 1998  . Chronic alcohol abuse 2009  . Colon polyps 2011   adenomatous  . Congenital single kidney   . ED (erectile dysfunction)   . Prostate cancer [facility]) 2010   total prostatectomy    Past Surgical History:  Procedure Laterality Date  . COLONOSCOPY    . KNEE ARTHROSCOPY Right 2012  . [place] N/A 2010   [facility] urology   . VASECTOMY  1977    Family History  Problem Relation Age of Onset  . Cancer Mother   . Heart disease Mother   . Stroke Father   . Hyperlipidemia Brother   . Hypertension Brother   . Atrial fibrillation Brother   . Hyperlipidemia Brother   . Hypertension Brother   .  Diabetes Brother   . Hyperlipidemia Brother   . Hypertension Brother   . Hyperlipidemia Brother   . Hypertension Brother   . Colon cancer Neg Hx   . Rectal cancer Neg Hx   . Stomach cancer Neg Hx     Social History   Socioeconomic History  . Marital status: Divorced    Spouse name: Not on file  . Number of children: 2  . Years of education: Not on file  . Highest education level: Not on file  Occupational History  . Occupation: Charity fundraiser    Comment: Retired  . Occupation: Pension scheme manager    Comment: [employer]  Tobacco Use  . Smoking status: Former Smoker    Quit date: 02/02/2015    Years since quitting: 4.3  . Smokeless tobacco: Never Used  Substance and Sexual Activity  . Alcohol use: No    Alcohol/week: 0.0 standard drinks  . Drug use: No  . Sexual activity: Not on file  Other Topics Concern  . Not on file  Social History Narrative   Divorced long ago   Engaged for 2020      Has living will   [name] should make decisions   Would accept resuscitation   Would accept tube feeds short term--- but not if cognitively unaware   Social Determinants of Radio broadcast assistant  Strain:   . Difficulty of Paying Living Expenses:   Food Insecurity:   . Worried About Charity fundraiser in the Last Year:   . Arboriculturist in the Last Year:   Transportation Needs:   . Film/video editor (Medical):   Marland Kitchen Lack of Transportation (Non-Medical):   Physical Activity:   . Days of Exercise per Week:   . Minutes of Exercise per Session:   Stress:   . Feeling of Stress :   Social Connections:   . Frequency of Communication with Friends and Family:   . Frequency of Social Gatherings with Friends and Family:   . Attends Religious Services:   . Active Member of Clubs or Organizations:   . Attends Archivist Meetings:   Marland Kitchen Marital Status:   Intimate Partner Violence:   . Fear of Current or Ex-Partner:   . Emotionally Abused:   Marland Kitchen Physically  Abused:   . Sexually Abused:    Review of Systems  Feels well No fever     Objective:   Physical Exam  Constitutional: He appears well-developed. No distress.  Skin:  3 x 3 cm mass ---firm (not like lipoma) on anterolateral surface Doesn't seem to be near bone--but can't be sure           Assessment & Plan:

## 2019-06-23 NOTE — Assessment & Plan Note (Signed)
Has been there for a while--but growing considerably Texture not quite like lipoma now (or cyst) Will check x-ray--make sure no bone involvement Refer to general surgery

## 2019-07-14 NOTE — Telephone Encounter (Signed)
Can you just recheck with them---I don't think that is correct. This is a soft tissue mass in the upper calf--that has no relationship to the bone.  It is not an orthopedic issue Have Juan Holmes recheck with one of her doctors

## 2019-07-14 NOTE — Telephone Encounter (Signed)
Per [email or web address], they cannot treat lower extremities; per triage would recommend ortho surgery.  Pt aware.  Will need new referral for Ortho Surgery.

## 2019-07-15 NOTE — Telephone Encounter (Signed)
Okay, that sounds appropriate.

## 2019-07-15 NOTE — Telephone Encounter (Signed)
Records were reviewed by MD and pt will be seen by [facility].  They have LMOM for pt to get scheduled.

## 2019-08-26 DIAGNOSIS — R2241 Localized swelling, mass and lump, right lower limb: Secondary | ICD-10-CM | POA: Diagnosis not present

## 2019-08-26 DIAGNOSIS — L723 Sebaceous cyst: Secondary | ICD-10-CM | POA: Diagnosis not present

## 2019-08-27 ENCOUNTER — Ambulatory Visit: Payer: Self-pay | Admitting: Surgery

## 2019-08-27 NOTE — H&P (Signed)
History of Present Illness Juan Holmes. Juan Allen MD; 08/27/2019 12:42 PM) The patient is a 69 year old male who presents with a complaint of Mass. Referred by Dr. Viviana Holmes for mass on right leg  This is a healthy 69 year old male who presents with an enlarging mass on his right anterolateral leg. Plain films were unremarkable. No sign of infection or drainage. Mild tenderness to palpation.  He also has a palpable mass in his left buttock near the anus. This does not seem to be enlarging, but is very noticeable to the patient.   RIGHT TIBIA AND FIBULA - 2 VIEW  COMPARISON: None.  FINDINGS: There is focal skin thickening along the anterolateral aspect of the mid lower leg. No underlying osseous abnormality. Mild tricompartment osteoarthritis. No knee joint effusion.  IMPRESSION: 1. Focal soft tissue thickening along the anterolateral aspect of the mid lower leg. No underlying osseous abnormality. 2. Mild tricompartment osteoarthritis in the knee.   Electronically Signed By: Juan Holmes M.D. On: 06/24/2019 09:46   Problem List/Past Medical Juan Key K. Keir Viernes, MD; 08/27/2019 12:42 PM) SEBACEOUS CYST (L72.3) SUBCUTANEOUS MASS OF RIGHT LOWER LEG (R22.41)  Past Surgical History Juan Holmes, RMA; 08/26/2019 10:46 AM) Knee Surgery Right. Prostate Surgery - Removal Vasectomy  Diagnostic Studies History Juan Holmes, Juan Holmes; 08/26/2019 10:46 AM) Colonoscopy 1-5 years ago  Allergies Juan Holmes, RMA; 08/26/2019 10:51 AM) PCN-200 *Miscellaneous Therapeutic Classes**  Medication History Juan Holmes, RMA; 08/26/2019 10:49 AM) Sildenafil Citrate (20MG  Tablet, Oral) Active. Propranolol HCl (40MG  Tablet, Oral) Active.  Social History Juan Holmes, Juan Holmes; 08/26/2019 10:46 AM) Alcohol use Occasional alcohol use. Caffeine use Coffee. No drug use Tobacco use Current some day smoker.  Family History Juan Holmes, Juan Holmes; 08/26/2019 10:46 AM) Arthritis  Father. Cancer Mother. Cerebrovascular Accident Father. Diabetes Mellitus Brother. Hypertension Brother. Thyroid problems Father.  Other Problems Juan Holmes. Juan Rey, MD; 08/27/2019 12:42 PM) Prostate Cancer     Review of Systems Juan Holmes RMA; 08/26/2019 10:46 AM) General Not Present- Appetite Loss, Chills, Fatigue, Fever, Night Sweats, Weight Gain and Weight Loss. Skin Not Present- Change in Wart/Mole, Dryness, Hives, Jaundice, New Lesions, Non-Healing Wounds, Rash and Ulcer. HEENT Present- Wears glasses/contact lenses. Not Present- Earache, Hearing Loss, Hoarseness, Nose Bleed, Oral Ulcers, Ringing in the Ears, Seasonal Allergies, Sinus Pain, Sore Throat, Visual Disturbances and Yellow Eyes. Respiratory Not Present- Bloody sputum, Chronic Cough, Difficulty Breathing, Snoring and Wheezing. Breast Not Present- Breast Mass, Breast Pain, Nipple Discharge and Skin Changes. Cardiovascular Not Present- Chest Pain, Difficulty Breathing Lying Down, Leg Cramps, Palpitations, Rapid Heart Rate, Shortness of Breath and Swelling of Extremities. Gastrointestinal Not Present- Abdominal Pain, Bloating, Bloody Stool, Change in Bowel Habits, Chronic diarrhea, Constipation, Difficulty Swallowing, Excessive gas, Gets full quickly at meals, Hemorrhoids, Indigestion, Nausea, Rectal Pain and Vomiting. Male Genitourinary Not Present- Blood in Urine, Change in Urinary Stream, Frequency, Impotence, Nocturia, Painful Urination, Urgency and Urine Leakage. Musculoskeletal Not Present- Back Pain, Joint Pain, Joint Stiffness, Muscle Pain, Muscle Weakness and Swelling of Extremities. Neurological Not Present- Decreased Memory, Fainting, Headaches, Numbness, Seizures, Tingling, Tremor, Trouble walking and Weakness. Psychiatric Not Present- Anxiety, Bipolar, Change in Sleep Pattern, Depression, Fearful and Frequent crying. Endocrine Not Present- Cold Intolerance, Excessive Hunger, Hair Changes, Heat Intolerance, Hot  flashes and New Diabetes. Hematology Not Present- Blood Thinners, Easy Bruising, Excessive bleeding, Gland problems, HIV and Persistent Infections.  Vitals Juan Holmes RMA; 08/26/2019 10:52 AM) 08/26/2019 10:51 AM Weight: 182.5 lb Height: 71in Body Surface Area: 2.03 m Body Mass Index: 25.45 kg/m  Temp.: 97.68F  Pulse: 51 (Regular)  BP: 130/82(Sitting, Left Arm, Standard)        Physical Exam Juan Key K. Derald Lorge MD; 08/27/2019 12:43 PM)  The physical exam findings are as follows: Note:Constitutional: WDWN in NAD, conversant, no obvious deformities; resting comfortably Eyes: Pupils equal, round; sclera anicteric; moist conjunctiva; no lid lag HENT: Oral mucosa moist; good dentition Neck: No masses palpated, trachea midline; no thyromegaly Lungs: CTA bilaterally; normal respiratory effort CV: Regular rate and rhythm; no murmurs; extremities well-perfused with no edema Abd: +bowel sounds, soft, non-tender, no palpable organomegaly; no palpable hernias GU; left buttock near the anus - 2 cm palpable firm subcutaneous mass; no overlying skin changes Musc: Normal gait; no apparent clubbing or cyanosis in extremities; right anterolateral lower leg - protruding 3 cm subcutaneous mass; no inflammation; does not appear to be fixed to the underlying muscle Lymphatic: No palpable cervical or axillary lymphadenopathy Skin: Warm, dry; no sign of jaundice Psychiatric - alert and oriented x 4; calm mood and affect    Assessment & Plan Juan Key K. Rania Prothero MD; 08/26/2019 12:26 PM)  SEBACEOUS CYST (L72.3) Impression: left buttock near anus - 2 cm subcutaneous   SUBCUTANEOUS MASS OF RIGHT LOWER LEG (R22.41) Impression: 3 cm anterolateral  Current Plans Schedule for Surgery - Excision of subcutaneous mass - right leg and sebaceous cyst - left buttock. The surgical procedure has been discussed with the patient. Potential risks, benefits, alternative treatments, and expected outcomes  have been explained. All of the patient's questions at this time have been answered. The likelihood of reaching the patient's treatment goal is good. The patient understand the proposed surgical procedure and wishes to proceed.  Juan Holmes. Georgette Dover, MD, [facility]  General/ Trauma Surgery   08/27/2019 12:44 PM

## 2019-09-30 DIAGNOSIS — Z1152 Encounter for screening for COVID-19: Secondary | ICD-10-CM | POA: Diagnosis not present

## 2019-10-04 DIAGNOSIS — L723 Sebaceous cyst: Secondary | ICD-10-CM | POA: Diagnosis not present

## 2019-10-04 DIAGNOSIS — R2241 Localized swelling, mass and lump, right lower limb: Secondary | ICD-10-CM | POA: Diagnosis not present

## 2019-10-04 DIAGNOSIS — L948 Other specified localized connective tissue disorders: Secondary | ICD-10-CM | POA: Diagnosis not present

## 2019-11-30 ENCOUNTER — Encounter: Payer: Self-pay | Admitting: Internal Medicine

## 2019-11-30 ENCOUNTER — Other Ambulatory Visit: Payer: Self-pay

## 2019-11-30 ENCOUNTER — Ambulatory Visit (INDEPENDENT_AMBULATORY_CARE_PROVIDER_SITE_OTHER): Payer: Medicare Other | Admitting: Internal Medicine

## 2019-11-30 VITALS — BP 122/80 | HR 62 | Temp 97.2°F | Ht 71.5 in | Wt 183.0 lb

## 2019-11-30 DIAGNOSIS — Z7189 Other specified counseling: Secondary | ICD-10-CM

## 2019-11-30 DIAGNOSIS — G250 Essential tremor: Secondary | ICD-10-CM

## 2019-11-30 DIAGNOSIS — F1021 Alcohol dependence, in remission: Secondary | ICD-10-CM

## 2019-11-30 DIAGNOSIS — Z8546 Personal history of malignant neoplasm of prostate: Secondary | ICD-10-CM

## 2019-11-30 DIAGNOSIS — Z0000 Encounter for general adult medical examination without abnormal findings: Secondary | ICD-10-CM

## 2019-11-30 DIAGNOSIS — J301 Allergic rhinitis due to pollen: Secondary | ICD-10-CM

## 2019-11-30 DIAGNOSIS — E785 Hyperlipidemia, unspecified: Secondary | ICD-10-CM

## 2019-11-30 LAB — HEPATIC FUNCTION PANEL
ALT: 14 U/L (ref 0–53)
AST: 7 U/L (ref 0–37)
Albumin: 4.4 g/dL (ref 3.5–5.2)
Alkaline Phosphatase: 51 U/L (ref 39–117)
Bilirubin, Direct: 0.1 mg/dL (ref 0.0–0.3)
Total Bilirubin: 0.7 mg/dL (ref 0.2–1.2)
Total Protein: 6.7 g/dL (ref 6.0–8.3)

## 2019-11-30 LAB — RENAL FUNCTION PANEL
Albumin: 4.4 g/dL (ref 3.5–5.2)
BUN: 12 mg/dL (ref 6–23)
CO2: 30 mEq/L (ref 19–32)
Calcium: 9.1 mg/dL (ref 8.4–10.5)
Chloride: 104 mEq/L (ref 96–112)
Creatinine, Ser: 0.98 mg/dL (ref 0.40–1.50)
GFR: 75.93 mL/min (ref >60.00)
Glucose, Bld: 102 mg/dL — ABNORMAL HIGH (ref 70–99)
Phosphorus: 2.5 mg/dL (ref 2.3–4.6)
Potassium: 5.3 mEq/L — ABNORMAL HIGH (ref 3.5–5.1)
Sodium: 138 mEq/L (ref 135–145)

## 2019-11-30 LAB — CBC
HCT: 45.6 % (ref 39.0–52.0)
Hemoglobin: 15.2 g/dL (ref 13.0–17.0)
MCHC: 33.4 g/dL (ref 30.0–36.0)
MCV: 94.7 fl (ref 78.0–100.0)
Platelets: 192 10*3/uL (ref 150.0–400.0)
RBC: 4.82 Mil/uL (ref 4.22–5.81)
RDW: 14.4 % (ref 11.5–15.5)
WBC: 6.4 10*3/uL (ref 4.0–10.5)

## 2019-11-30 LAB — PSA: PSA: 0.07 ng/mL — ABNORMAL LOW (ref 0.10–4.00)

## 2019-11-30 MED ORDER — PROPRANOLOL HCL 40 MG PO TABS
40.0000 mg | ORAL_TABLET | Freq: Three times a day (TID) | ORAL | 0 refills | Status: DC | PRN
Start: 2019-11-30 — End: 2020-03-15

## 2019-11-30 NOTE — Assessment & Plan Note (Signed)
Uses the propranolol prn only

## 2019-11-30 NOTE — Assessment & Plan Note (Signed)
See social history 

## 2019-11-30 NOTE — Assessment & Plan Note (Signed)
Surgery many years ago Will check PSA

## 2019-11-30 NOTE — Progress Notes (Signed)
Hearing Screening   Method: Audiometry   125Hz  250Hz  500Hz  1000Hz  2000Hz  3000Hz  4000Hz  6000Hz  8000Hz   Right ear:   20 20 20  20     Left ear:   20 20 20  20       Visual Acuity Screening   Right eye Left eye Both eyes  Without correction:     With correction: 20/10 20/10 20/10

## 2019-11-30 NOTE — Assessment & Plan Note (Addendum)
I have personally reviewed the Medicare Annual Wellness questionnaire and have noted  1. The patient's medical and social history  2. Their use of alcohol, tobacco or illicit drugs  3. Their current medications and supplements  4. The patient's functional ability including ADL's, fall risks, home safety risks and hearing or visual              impairment.  5. Diet and physical activities  6. Evidence for depression or mood disorders  The patients weight, height, BMI and visual acuity have been recorded in the chart  I have made referrals, counseling and provided education to the patient based review of the above and I have provided the pt with a written personalized care plan for preventive services.   I have provided you with a copy of your personalized plan for preventive services. Please take the time to review along with your updated medication list.  Flu vaccine soon Colon due next year Exercises regularly  Baseline EKG---sinus bradycardia at 49. Normal axis and intervals. No hypertrophy or ischemic changes.

## 2019-11-30 NOTE — Assessment & Plan Note (Signed)
Low risk profile Will hold off on statin Rx

## 2019-11-30 NOTE — Assessment & Plan Note (Signed)
Fexofenadine 180 daily prn

## 2019-11-30 NOTE — Progress Notes (Signed)
Subjective:    Patient ID: Juan Holmes, male    DOB: October 22, 1950, 69 y.o.   MRN: [id number]  HPI Here for initial Medicare wellness visit and follow up of chronic health conditions This visit occurred during the SARS-CoV-2 public health emergency.  Safety protocols were in place, including screening questions prior to the visit, additional usage of staff PPE, and extensive cleaning of exam room while observing appropriate contact time as indicated for disinfecting solutions.   Reviewed form and advanced directives Reviewed other doctors No tobacco Vision and hearing are fine In gym every morning No falls No depression or anhedonia Independent with instrumental ADLs No memory problems  Bored with retirement--looking into other alternatives Did help open a coffee shop in [place]  Will have a couple of beers once a week---at shop in [place] Past problems were related to "relationship problems" Marriage still on hold---can't get family here from [place] (but they have been together since 2010)  Tremor is not bad Only uses the propranolol as needed (and not that often)  Some allergy symptoms--especially now Uses allegra 2-3 times a week now  Known mild high cholesterol  Current Outpatient Medications on File Prior to Visit  Medication Sig Dispense Refill   propranolol (INDERAL) 40 MG tablet Take 1 tablet (40 mg total) by mouth 3 (three) times daily. 60 tablet 5   sildenafil (REVATIO) 20 MG tablet TAKE 3 TO 5 TABLETS BY MOUTH DAILY AS NEEDED 50 tablet 11   [DISCONTINUED] vardenafil (LEVITRA) 20 MG tablet Take 20 mg by mouth daily as needed for erectile dysfunction.     No current facility-administered medications on file prior to visit.    Allergies  Allergen Reactions   Penicillins Hives    Past Medical History:  Diagnosis Date   Allergic rhinitis due to pollen    Angiodysplasia of cecum 04/03/2015   Benign essential tremor 1998   Chronic alcohol abuse  2009   Colon polyps 2011   adenomatous   Congenital single kidney    ED (erectile dysfunction)    Prostate cancer ([place]) 2010   total prostatectomy    Past Surgical History:  Procedure Laterality Date   COLONOSCOPY     KNEE ARTHROSCOPY Right 2012   ROBOT ASSISTED LAPAROSCOPIC RADICAL PROSTATECTOMY N/A 2010   [facility] urology    VASECTOMY  1977    Family History  Problem Relation Age of Onset   Cancer Mother    Heart disease Mother    Stroke Father    Hyperlipidemia Brother    Hypertension Brother    Atrial fibrillation Brother    Hyperlipidemia Brother    Hypertension Brother    Diabetes Brother    Hyperlipidemia Brother    Hypertension Brother    Hyperlipidemia Brother    Hypertension Brother    Colon cancer Neg Hx    Rectal cancer Neg Hx    Stomach cancer Neg Hx     Social History   Socioeconomic History   Marital status: Divorced    Spouse name: Not on file   Number of children: 2   Years of education: Not on file   Highest education level: Not on file  Occupational History   Occupation: Charity fundraiser    Comment: Retired   Occupation: Pension scheme manager    Comment: Retired  Tobacco Use   Smoking status: Former Smoker    Quit date: 02/02/2015    Years since quitting: 4.8   Smokeless tobacco: Never Used  Substance and Sexual  Activity   Alcohol use: No    Alcohol/week: 0.0 standard drinks   Drug use: No   Sexual activity: Not on file  Other Topics Concern   Not on file  Social History Narrative   Divorced long ago   Long term relationship since 2010   Engaged for 2020      Has living will   [name] should make decisions   Would accept resuscitation   Would accept tube feeds short term--- but not if cognitively unaware   Social Determinants of Health   Financial Resource Strain:    Difficulty of Paying Living Expenses: Not on file  Food Insecurity:    Worried About Charity fundraiser in  the Last Year: Not on file   [employer] of Food in the Last Year: Not on file  Transportation Needs:    Lack of Transportation (Medical): Not on file   Lack of Transportation (Non-Medical): Not on file  Physical Activity:    Days of Exercise per Week: Not on file   Minutes of Exercise per Session: Not on file  Stress:    Feeling of Stress : Not on file  Social Connections:    Frequency of Communication with Friends and Family: Not on file   Frequency of Social Gatherings with Friends and Family: Not on file   Attends Religious Services: Not on file   Active Member of Clubs or Organizations: Not on file   Attends Archivist Meetings: Not on file   Marital Status: Not on file  Intimate Partner Violence:    Fear of Current or Ex-Partner: Not on file   Emotionally Abused: Not on file   Physically Abused: Not on file   Sexually Abused: Not on file   Review of Systems Has had dental implant and other work done Appetite is good Weight is stable Sleeps fairly well---- for 5-6 hours. Then not as good. Will nap after lunch. Wears seat belt No heartburn or dysphagia Bowels move fine--no blood Urine stream is fine. Sildenafil helps when used No chest pain or SOB No dizziness or syncope No edema Gets muscle soreness--but no sig back or joint pain No suspicious skin lesions now--sees derm daily    Objective:   Physical Exam Constitutional:      Appearance: Normal appearance.  HENT:     Mouth/Throat:     Comments: No oral lesions Eyes:     Conjunctiva/sclera: Conjunctivae normal.     Pupils: Pupils are equal, round, and reactive to light.  Cardiovascular:     Rate and Rhythm: Normal rate and regular rhythm.     Pulses: Normal pulses.     Heart sounds: No murmur heard.  No gallop.   Abdominal:     Palpations: Abdomen is soft.     Tenderness: There is no abdominal tenderness.  Musculoskeletal:     Cervical back: Neck supple.     Right lower leg: No  edema.     Left lower leg: No edema.  Lymphadenopathy:     Cervical: No cervical adenopathy.  Skin:    General: Skin is warm.     Findings: No rash.  Neurological:     Mental Status: He is alert and oriented to person, place, and time.     Comments: President--- "Edmon Crape, Trump, Obama" [id number] Recall 3/3  Psychiatric:        Mood and Affect: Mood normal.        Behavior: Behavior normal.  Assessment & Plan:

## 2019-11-30 NOTE — Assessment & Plan Note (Signed)
He feels his problems with stress related Has an occasional beer now Discussed being safe

## 2019-12-03 ENCOUNTER — Other Ambulatory Visit: Payer: Self-pay

## 2019-12-03 ENCOUNTER — Ambulatory Visit (INDEPENDENT_AMBULATORY_CARE_PROVIDER_SITE_OTHER): Payer: Medicare Other

## 2019-12-03 DIAGNOSIS — Z23 Encounter for immunization: Secondary | ICD-10-CM

## 2020-01-06 ENCOUNTER — Ambulatory Visit
Admission: EM | Admit: 2020-01-06 | Discharge: 2020-01-06 | Disposition: A | Discharge status: Home / Self Care | Payer: Medicare Other | Attending: Family Medicine | Admitting: Family Medicine

## 2020-01-06 ENCOUNTER — Telehealth: Payer: Self-pay | Admitting: Family Medicine

## 2020-01-06 DIAGNOSIS — Z1152 Encounter for screening for COVID-19: Secondary | ICD-10-CM | POA: Diagnosis not present

## 2020-01-06 DIAGNOSIS — R059 Cough, unspecified: Secondary | ICD-10-CM | POA: Diagnosis not present

## 2020-01-06 DIAGNOSIS — J069 Acute upper respiratory infection, unspecified: Secondary | ICD-10-CM

## 2020-01-06 DIAGNOSIS — J3489 Other specified disorders of nose and nasal sinuses: Secondary | ICD-10-CM | POA: Diagnosis not present

## 2020-01-06 DIAGNOSIS — J209 Acute bronchitis, unspecified: Secondary | ICD-10-CM

## 2020-01-06 DIAGNOSIS — R0981 Nasal congestion: Secondary | ICD-10-CM

## 2020-01-06 MED ORDER — AZITHROMYCIN 250 MG PO TABS: 250.0000 mg | ORAL_TABLET | Freq: Every day | ORAL | 0 refills | Status: DC

## 2020-01-06 NOTE — ED Provider Notes (Signed)
[place]   [id number] 01/06/20 Arrival Time: 0839   SUBJECTIVE:  Juan Holmes is a 69 y.o. male who presents with complaint of nasal congestion, post-nasal drainage, sinus pressure, post nasal drip and a persistent cough. Onset abrupt approximately 5 days ago. Overall fatigued. SOB: mild. Wheezing: mild. Has positive history of Covid. Has completed Covid vaccines. OTC treatment:none. Denies headache, nausea, vomiting, diarrhea, rash, fever, other symptoms. Social History   Tobacco Use  Smoking Status Former Smoker  . Quit date: 02/02/2015  . Years since quitting: 4.9  Smokeless Tobacco Never Used    ROS: As per HPI.   OBJECTIVE:  Vitals:   01/06/20 0903  BP: (!) 157/69  Pulse: 75  Resp: 16  Temp: 98.5 F (36.9 C)  TempSrc: Oral  SpO2: 95%  Weight: 180 lb (81.6 kg)  Height: 6' (1.829 m)     General appearance: alert; no distress HEENT: nasal congestion; clear runny nose; throat irritation secondary to post-nasal drainage Neck: supple with LAD Lungs: wheezing noted to RLL, mild cough present Skin: warm and dry Psychological: alert and cooperative; normal mood and affect  Results for orders placed or performed in visit on 11/30/19  Hepatic function panel  Result Value Ref Range   Total Bilirubin 0.7 0.2 - 1.2 mg/dL   Bilirubin, Direct 0.1 0.0 - 0.3 mg/dL   Alkaline Phosphatase 51 39 - 117 U/L   AST 7 0 - 37 U/L   ALT 14 0 - 53 U/L   Total Protein 6.7 6.0 - 8.3 g/dL   Albumin 4.4 3.5 - 5.2 g/dL  Renal function panel  Result Value Ref Range   Sodium 138 135 - 145 mEq/L   Potassium 5.3 No hemolysis seen (H) 3.5 - 5.1 mEq/L   Chloride 104 96 - 112 mEq/L   CO2 30 19 - 32 mEq/L   Albumin 4.4 3.5 - 5.2 g/dL   BUN 12 6 - 23 mg/dL   Creatinine, Ser 0.98 0.40 - 1.50 mg/dL   Glucose, Bld 102 (H) 70 - 99 mg/dL   Phosphorus 2.5 2.3 - 4.6 mg/dL   GFR 75.93 >60.00 mL/min   Calcium 9.1 8.4 - 10.5 mg/dL  PSA  Result Value Ref Range   PSA 0.07 (L) 0.10 -  4.00 ng/mL  CBC  Result Value Ref Range   WBC 6.4 4.0 - 10.5 K/uL   RBC 4.82 4.22 - 5.81 Mil/uL   Platelets 192.0 150 - 400 K/uL   Hemoglobin 15.2 13.0 - 17.0 g/dL   HCT 45.6 39 - 52 %   MCV 94.7 78.0 - 100.0 fl   MCHC 33.4 30.0 - 36.0 g/dL   RDW 14.4 11.5 - 15.5 %    Labs Reviewed  NOVEL CORONAVIRUS, NAA    No results found.  Allergies  Allergen Reactions  . Penicillins Hives    Past Medical History:  Diagnosis Date  . Allergic rhinitis due to pollen   . Angiodysplasia of cecum 04/03/2015  . Benign essential tremor 1998  . Chronic alcohol abuse 2009  . Colon polyps 2011   adenomatous  . Congenital single kidney   . ED (erectile dysfunction)   . Prostate cancer [facility]) 2010   total prostatectomy   Social History   Socioeconomic History  . Marital status: Divorced    Spouse name: Not on file  . Number of children: 2  . Years of education: Not on file  . Highest education level: Not on file  Occupational History  . Occupation:  Charity fundraiser    Comment: Retired  . Occupation: Pension scheme manager    Comment: Retired  Tobacco Use  . Smoking status: Former Smoker    Quit date: 02/02/2015    Years since quitting: 4.9  . Smokeless tobacco: Never Used  Substance and Sexual Activity  . Alcohol use: No    Alcohol/week: 0.0 standard drinks  . Drug use: No  . Sexual activity: Not on file  Other Topics Concern  . Not on file  Social History Narrative   Divorced long ago   Long term relationship since 2010   Engaged for 2020      Has living will   [name] should make decisions   Would accept resuscitation   Would accept tube feeds short term--- but not if cognitively unaware   Social Determinants of Health   Financial Resource Strain:   . Difficulty of Paying Living Expenses: Not on file  Food Insecurity:   . Worried About Charity fundraiser in the Last Year: Not on file  . Ran Out of Food in the Last Year: Not on file  Transportation Needs:     . Lack of Transportation (Medical): Not on file  . Lack of Transportation (Non-Medical): Not on file  Physical Activity:   . Days of Exercise per Week: Not on file  . Minutes of Exercise per Session: Not on file  Stress:   . Feeling of Stress : Not on file  Social Connections:   . Frequency of Communication with Friends and Family: Not on file  . Frequency of Social Gatherings with Friends and Family: Not on file  . Attends Religious Services: Not on file  . Active Member of Clubs or Organizations: Not on file  . Attends Archivist Meetings: Not on file  . Marital Status: Not on file  Intimate Partner Violence:   . Fear of Current or Ex-Partner: Not on file  . Emotionally Abused: Not on file  . Physically Abused: Not on file  . Sexually Abused: Not on file   Family History  Problem Relation Age of Onset  . Cancer Mother   . Heart disease Mother   . Stroke Father   . Hyperlipidemia Brother   . Hypertension Brother   . Atrial fibrillation Brother   . Hyperlipidemia Brother   . Hypertension Brother   . Diabetes Brother   . Hyperlipidemia Brother   . Hypertension Brother   . Hyperlipidemia Brother   . Hypertension Brother   . Colon cancer Neg Hx   . Rectal cancer Neg Hx   . Stomach cancer Neg Hx      ASSESSMENT & PLAN:  1. Upper respiratory tract infection, unspecified type   2. Encounter for screening for COVID-19   3. Acute bronchitis, unspecified organism   4. Cough   5. Sinus pressure   6. Nasal congestion    Prescribed Azithromycin Prescribed Allegra D OTC symptom care as needed. Will plan f/u with PCP or here as needed.  Reviewed expectations re: course of current medical issues. Questions answered. Outlined signs and symptoms indicating need for more acute intervention. Patient verbalized understanding. After Visit Summary given.           Faustino Congress, NP 01/06/20 [phone]

## 2020-01-06 NOTE — Discharge Instructions (Addendum)
I have sent in azithromycin for you to take. Take 2 tablets today, then one tablet daily for the next 4 days.  I have also sent in [place] D for you to take to help with congestion  Your COVID test is pending.  You should self quarantine until the test result is back.    Take Tylenol as needed for fever or discomfort.  Rest and keep yourself hydrated.    Go to the emergency department if you develop acute worsening symptoms.

## 2020-01-06 NOTE — ED Triage Notes (Signed)
Pt reports having nasal congestion that began on Wednesday. sts he have some sinus pressure. sts he has a productive cough. Denies fever.

## 2020-01-06 NOTE — Telephone Encounter (Signed)
Medication did not go through at visit

## 2020-01-07 LAB — NOVEL CORONAVIRUS, NAA: SARS-CoV-2, NAA: NOT DETECTED

## 2020-01-07 LAB — SARS-COV-2, NAA 2 DAY TAT

## 2020-01-10 NOTE — Telephone Encounter (Signed)
I spoke to patient and confirmed fax number.  Form faxed.

## 2020-01-10 NOTE — Telephone Encounter (Signed)
Form printed and placed in Dr Alla German inbox on his desk. CPE 11-30-19  Needs TB Skin Test  Tdap 2014 No Hep B or MMR on record

## 2020-01-10 NOTE — Telephone Encounter (Signed)
Okay to fax for him but first confirm the number Thanks!

## 2020-01-27 NOTE — Telephone Encounter (Signed)
I spoke to patient and I let him know I can't e-mail the form due to HIPAA.  He said he'd come by to pick up copy of the form.

## 2020-03-06 ENCOUNTER — Other Ambulatory Visit: Payer: Self-pay

## 2020-03-06 ENCOUNTER — Ambulatory Visit (INDEPENDENT_AMBULATORY_CARE_PROVIDER_SITE_OTHER): Payer: Medicare Other

## 2020-03-06 DIAGNOSIS — Z111 Encounter for screening for respiratory tuberculosis: Secondary | ICD-10-CM | POA: Diagnosis not present

## 2020-03-06 NOTE — Progress Notes (Signed)
Per orders of Dr. Tillman Abide, injection of PPD  given by Donnamarie Poag in left arm. Patient tolerated injection well. Patient will make appointment for 48 hours to have resulted. Patient did state that he had form that was given to [facility] that will need to be completed at that time.

## 2020-03-08 ENCOUNTER — Ambulatory Visit (INDEPENDENT_AMBULATORY_CARE_PROVIDER_SITE_OTHER): Payer: Medicare Other

## 2020-03-08 DIAGNOSIS — Z111 Encounter for screening for respiratory tuberculosis: Secondary | ICD-10-CM

## 2020-03-08 LAB — TB SKIN TEST
Induration: 0 mm
TB Skin Test: NEGATIVE

## 2020-03-08 NOTE — Progress Notes (Signed)
Pt in office for PPD reading. Results were negative. Form completed and faxed to number provided by patient.

## 2020-03-15 ENCOUNTER — Other Ambulatory Visit: Payer: Self-pay | Admitting: Internal Medicine

## 2020-05-14 IMAGING — DX DG TIBIA/FIBULA 2V*R*
4 series · 4 of 4 positions shown · non-contrast
Comparison: None.

CLINICAL DATA: Mid calf mass.  Evaluate for osseous involvement.

EXAM:
RIGHT TIBIA AND FIBULA - 2 VIEW

[tibia ap (1 of 2)]
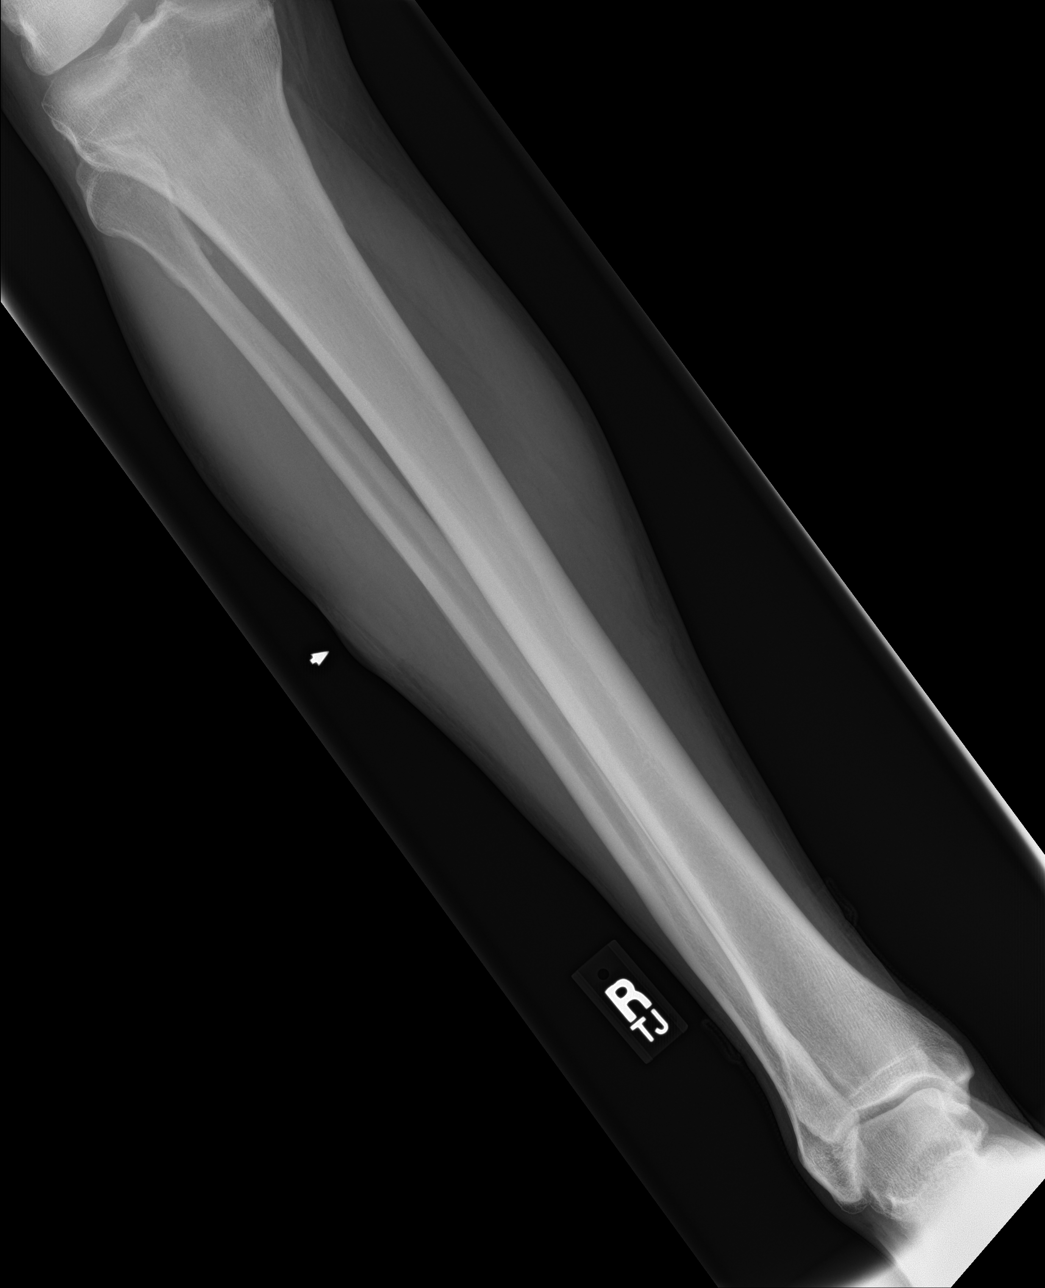

[tibia ap (2 of 2)]
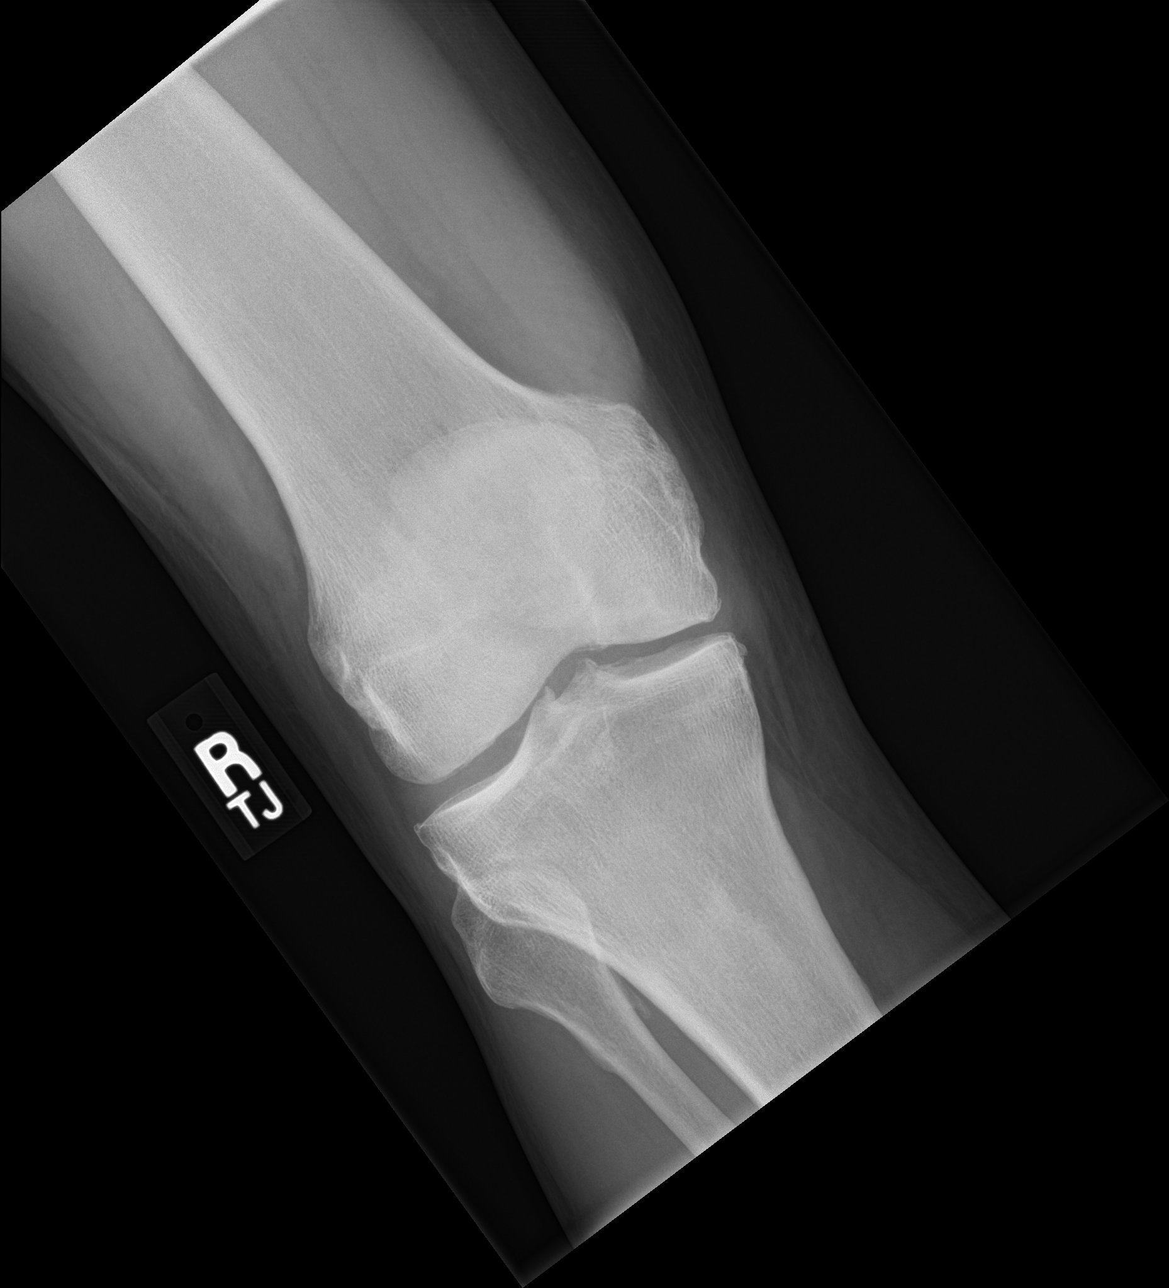

[tibia lat (1 of 2)]
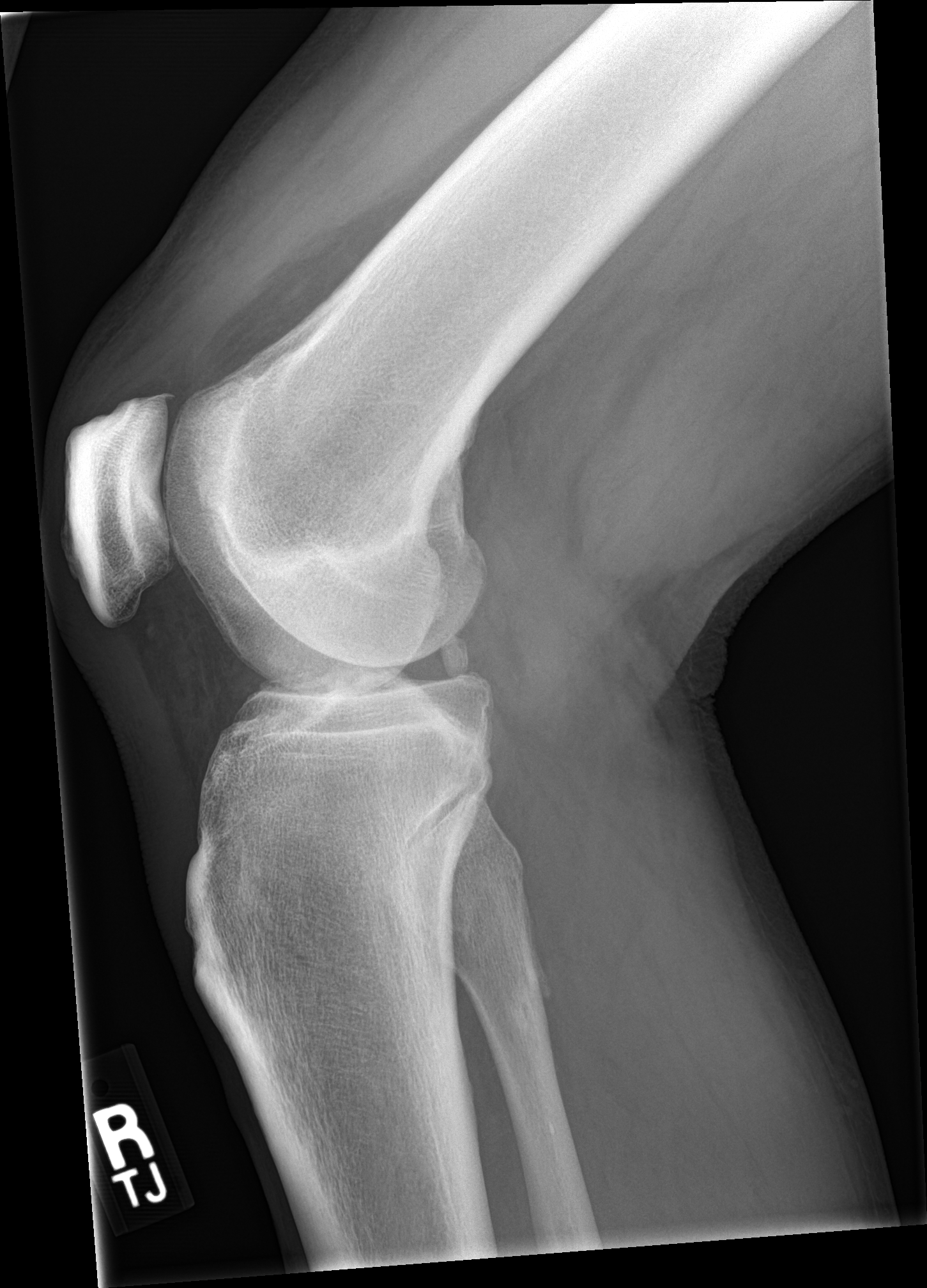

[tibia lat (2 of 2)]
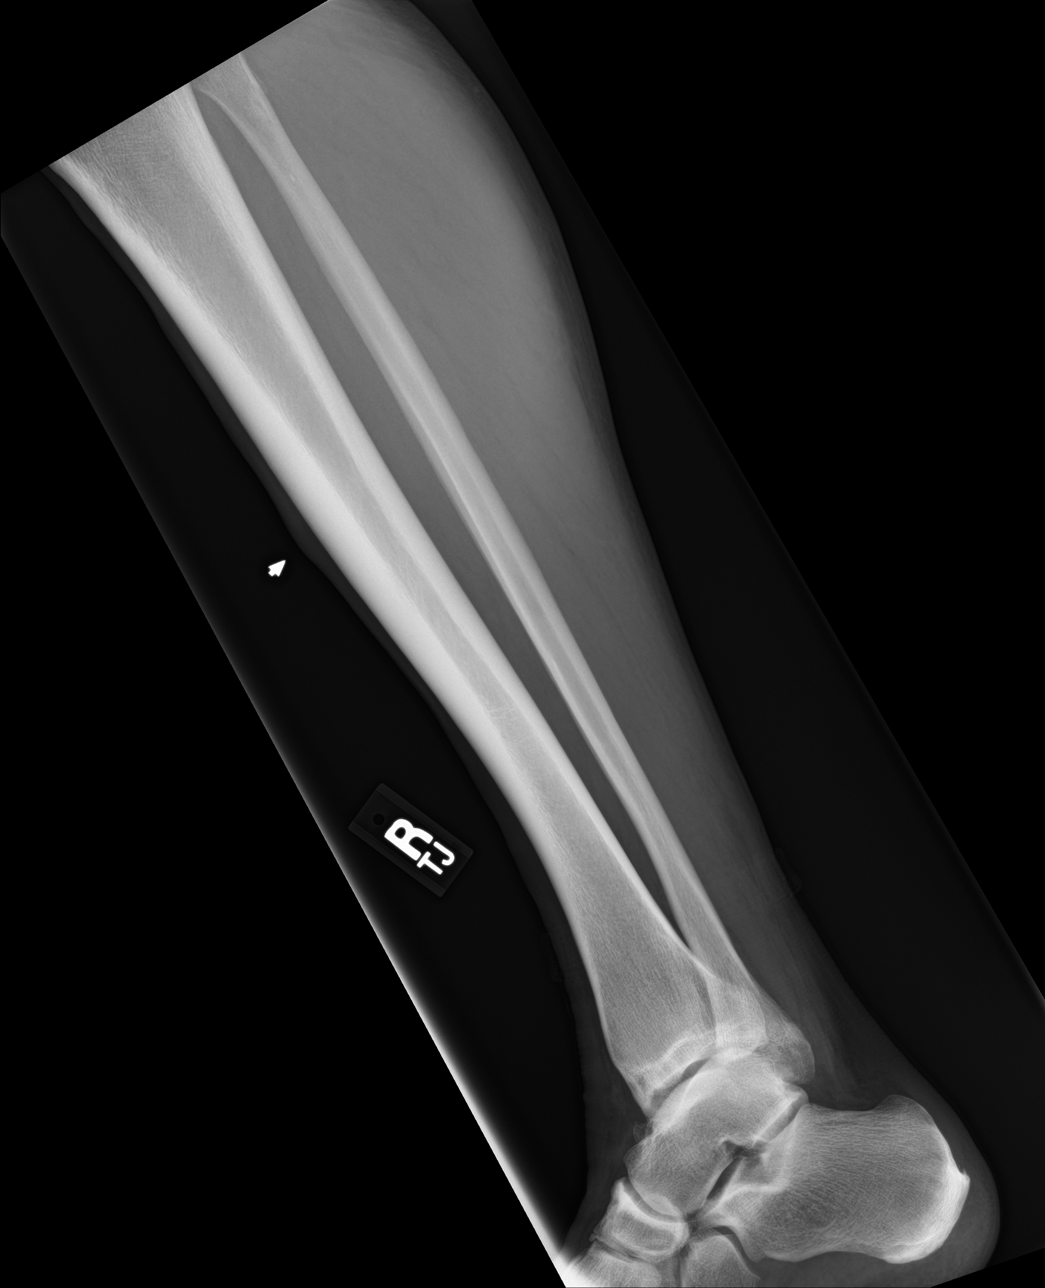

[4 of 4 positions shown; findings below may reference images not displayed]

FINDINGS: There is focal skin thickening along the anterolateral aspect of the
mid lower leg. No underlying osseous abnormality. Mild
tricompartment osteoarthritis. No knee joint effusion.
IMPRESSION: 1. Focal soft tissue thickening along the anterolateral aspect of
the mid lower leg. No underlying osseous abnormality.
2. Mild tricompartment osteoarthritis in the knee.

## 2020-05-31 ENCOUNTER — Encounter: Payer: Self-pay | Admitting: Internal Medicine

## 2020-07-19 MED ORDER — PROPRANOLOL HCL 40 MG PO TABS: 40.0000 mg | ORAL_TABLET | Freq: Three times a day (TID) | ORAL | 11 refills | Status: DC

## 2020-07-19 NOTE — Telephone Encounter (Signed)
Rx sent electronically. Sending to Dr Silvio Pate about his positive Covid.

## 2020-07-20 ENCOUNTER — Other Ambulatory Visit: Payer: Self-pay | Admitting: Internal Medicine

## 2020-09-18 DIAGNOSIS — S81811A Laceration without foreign body, right lower leg, initial encounter: Secondary | ICD-10-CM | POA: Diagnosis not present

## 2020-09-18 DIAGNOSIS — Z880 Allergy status to penicillin: Secondary | ICD-10-CM | POA: Diagnosis not present

## 2020-09-18 DIAGNOSIS — W268XXA Contact with other sharp object(s), not elsewhere classified, initial encounter: Secondary | ICD-10-CM | POA: Diagnosis not present

## 2020-09-30 DIAGNOSIS — S81811A Laceration without foreign body, right lower leg, initial encounter: Secondary | ICD-10-CM | POA: Diagnosis not present

## 2020-09-30 DIAGNOSIS — Z4802 Encounter for removal of sutures: Secondary | ICD-10-CM | POA: Diagnosis not present

## 2021-02-05 ENCOUNTER — Encounter: Payer: Self-pay | Admitting: Internal Medicine

## 2021-04-05 ENCOUNTER — Encounter: Payer: Self-pay | Admitting: Internal Medicine

## 2021-04-15 ENCOUNTER — Encounter: Payer: Self-pay | Admitting: Internal Medicine

## 2021-04-18 ENCOUNTER — Ambulatory Visit: Payer: Medicare Other | Admitting: Dermatology

## 2021-04-18 ENCOUNTER — Other Ambulatory Visit: Payer: Self-pay

## 2021-04-18 DIAGNOSIS — L814 Other melanin hyperpigmentation: Secondary | ICD-10-CM

## 2021-04-18 DIAGNOSIS — L821 Other seborrheic keratosis: Secondary | ICD-10-CM | POA: Diagnosis not present

## 2021-04-18 DIAGNOSIS — L820 Inflamed seborrheic keratosis: Secondary | ICD-10-CM

## 2021-04-18 DIAGNOSIS — Z1283 Encounter for screening for malignant neoplasm of skin: Secondary | ICD-10-CM | POA: Diagnosis not present

## 2021-04-18 DIAGNOSIS — L719 Rosacea, unspecified: Secondary | ICD-10-CM | POA: Diagnosis not present

## 2021-04-18 DIAGNOSIS — D229 Melanocytic nevi, unspecified: Secondary | ICD-10-CM | POA: Diagnosis not present

## 2021-04-18 DIAGNOSIS — D1800 Hemangioma unspecified site: Secondary | ICD-10-CM

## 2021-04-18 DIAGNOSIS — L578 Other skin changes due to chronic exposure to nonionizing radiation: Secondary | ICD-10-CM

## 2021-04-18 NOTE — Patient Instructions (Addendum)

## 2021-04-18 NOTE — Progress Notes (Signed)
New Patient Visit  Subjective  Juan Holmes is a 71 y.o. male who presents for the following: Other (New patient - No history of skin cancer or abnormal moles - TBSE today). The patient presents for Total-Body Skin Exam (TBSE) for skin cancer screening and mole check.  The patient has spots, moles and lesions to be evaluated, some may be new or changing and the patient has concerns that these could be cancer.  The following portions of the chart were reviewed this encounter and updated as appropriate:   Tobacco   Allergies   Meds   Problems   Med Hx   Surg Hx   Fam Hx      Review of Systems:  No other skin or systemic complaints except as noted in HPI or Assessment and Plan.  Objective  Well appearing patient in no apparent distress; mood and affect are within normal limits.  A full examination was performed including scalp, head, eyes, ears, nose, lips, neck, chest, axillae, abdomen, back, buttocks, bilateral upper extremities, bilateral lower extremities, hands, feet, fingers, toes, fingernails, and toenails. All findings within normal limits unless otherwise noted below.  Head - Anterior (Face) Dilated blood vessels.  Back x 3, right arm x 1, left ant thigh x 1 (5) Erythematous stuck-on, waxy papule or plaque   Assessment & Plan   Lentigines - Scattered tan macules - Due to sun exposure - Benign-appearing, observe - Recommend daily broad spectrum sunscreen SPF 30+ to sun-exposed areas, reapply every 2 hours as needed. - Call for any changes  Seborrheic Keratoses - Stuck-on, waxy, tan-brown papules and/or plaques  - Benign-appearing - Discussed benign etiology and prognosis. - Observe - Call for any changes  Melanocytic Nevi - Tan-brown and/or pink-flesh-colored symmetric macules and papules - Benign appearing on exam today - Observation - Call clinic for new or changing moles - Recommend daily use of broad spectrum spf 30+ sunscreen to sun-exposed areas.    Hemangiomas - Red papules - Discussed benign nature - Observe - Call for any changes  Actinic Damage - Chronic condition, secondary to cumulative UV/sun exposure - diffuse scaly erythematous macules with underlying dyspigmentation - Recommend daily broad spectrum sunscreen SPF 30+ to sun-exposed areas, reapply every 2 hours as needed.  - Staying in the shade or wearing long sleeves, sun glasses (UVA+UVB protection) and wide brim hats (4-inch brim around the entire circumference of the hat) are also recommended for sun protection.  - Call for new or changing lesions.  Skin cancer screening performed today.  Rosacea Head - Anterior (Face)  Rosacea is a chronic progressive skin condition usually affecting the face of adults, causing redness and/or acne bumps. It is treatable but not curable. It sometimes affects the eyes (ocular rosacea) as well. It may respond to topical and/or systemic medication and can flare with stress, sun exposure, alcohol, exercise and some foods.  Daily application of broad spectrum spf 30+ sunscreen to face is recommended to reduce flares.  Discussed the treatment option of BBL/laser.  Typically we recommend 1-3 treatment sessions about 5-8 weeks apart for best results.  The patient's condition may require "maintenance treatments" in the future.  The fee for BBL / laser treatments is $350 per treatment session for the whole face.  A fee can be quoted for other parts of the body. Insurance typically does not pay for BBL/laser treatments and therefore the fee is an out-of-pocket cost.   Inflamed seborrheic keratosis (5) Back x 3, right arm x 1,  left ant thigh x 1  Destruction of lesion - Back x 3, right arm x 1, left ant thigh x 1 Complexity: simple   Destruction method: cryotherapy   Informed consent: discussed and consent obtained   Timeout:  patient name, date of birth, surgical site, and procedure verified Lesion destroyed using liquid nitrogen: Yes    Region frozen until ice ball extended beyond lesion: Yes   Outcome: patient tolerated procedure well with no complications   Post-procedure details: wound care instructions given    Skin cancer screening   Return in about 1 year (around 04/18/2022) for TBSE.  I, Ashok Cordia, CMA, am acting as scribe for Sarina Ser, MD . Documentation: I have reviewed the above documentation for accuracy and completeness, and I agree with the above.  Sarina Ser, MD

## 2021-04-19 DIAGNOSIS — R059 Cough, unspecified: Secondary | ICD-10-CM | POA: Diagnosis not present

## 2021-04-19 DIAGNOSIS — R062 Wheezing: Secondary | ICD-10-CM | POA: Diagnosis not present

## 2021-04-19 DIAGNOSIS — J069 Acute upper respiratory infection, unspecified: Secondary | ICD-10-CM | POA: Diagnosis not present

## 2021-04-23 ENCOUNTER — Encounter: Payer: Self-pay | Admitting: Dermatology

## 2021-06-21 ENCOUNTER — Encounter: Payer: Self-pay | Admitting: Internal Medicine

## 2021-06-26 ENCOUNTER — Encounter: Payer: Self-pay | Admitting: Internal Medicine

## 2021-06-26 ENCOUNTER — Ambulatory Visit (INDEPENDENT_AMBULATORY_CARE_PROVIDER_SITE_OTHER)
Admission: RE | Admit: 2021-06-26 | Discharge: 2021-06-26 | Disposition: A | Discharge status: Home / Self Care | Payer: Medicare Other | Source: Ambulatory Visit | Attending: Internal Medicine | Admitting: Internal Medicine

## 2021-06-26 ENCOUNTER — Ambulatory Visit (INDEPENDENT_AMBULATORY_CARE_PROVIDER_SITE_OTHER): Payer: Medicare Other | Admitting: Internal Medicine

## 2021-06-26 VITALS — BP 134/80 | HR 58 | Ht 72.0 in | Wt 189.3 lb

## 2021-06-26 DIAGNOSIS — M79644 Pain in right finger(s): Secondary | ICD-10-CM

## 2021-06-26 DIAGNOSIS — M7989 Other specified soft tissue disorders: Secondary | ICD-10-CM | POA: Diagnosis not present

## 2021-06-26 NOTE — Assessment & Plan Note (Addendum)
Has long standing pain and bony prominence after fall 2 months ago ?Will check x-ray--this suggests unhealed fracture  ?Will set up with ortho ?

## 2021-06-26 NOTE — Progress Notes (Signed)
? ?Subjective:  ? ? Patient ID: Juan Holmes, male    DOB: [birth year], 71 y.o.   MRN: [id number] ? ?HPI ?Here due to persistent right thumb pain ? ?Was up in  in February ?Slipped on ice and rammed right thumb against the ground ?Swelled and was tender ?No problems with movement----but still some swelling and tenderness at PIP ? ?Trouble grabbing golf club ? ?Current Outpatient Medications on File Prior to Visit  ?Medication Sig Dispense Refill  ? propranolol (INDERAL) 40 MG tablet Take 1 tablet (40 mg total) by mouth 3 (three) times daily. 90 tablet 11  ? sildenafil (REVATIO) 20 MG tablet TAKE 3 TO 5 TABLETS BY MOUTH DAILY AS NEEDED 50 tablet 11  ? [DISCONTINUED] vardenafil (LEVITRA) 20 MG tablet Take 20 mg by mouth daily as needed for erectile dysfunction.    ? ?No current facility-administered medications on file prior to visit.  ? ? ?Allergies  ?Allergen Reactions  ? Penicillins Hives  ? ? ?Past Medical History:  ?Diagnosis Date  ? Allergic rhinitis due to pollen   ? Angiodysplasia of cecum 04/03/2015  ? Benign essential tremor 1998  ? Chronic alcohol abuse 2009  ? Colon polyps 2011  ? adenomatous  ? Congenital single kidney   ? ED (erectile dysfunction)   ? Prostate cancer [facility]) 2010  ? total prostatectomy  ? ? ?Past Surgical History:  ?Procedure Laterality Date  ? COLONOSCOPY    ? KNEE ARTHROSCOPY Right 2012  ? ROBOT ASSISTED LAPAROSCOPIC RADICAL PROSTATECTOMY N/A 2010  ? [facility] urology   ? VASECTOMY  1977  ? ? ?Family History  ?Problem Relation Age of Onset  ? Cancer Mother   ? Heart disease Mother   ? Stroke Father   ? Hyperlipidemia Brother   ? Hypertension Brother   ? Atrial fibrillation Brother   ? Hyperlipidemia Brother   ? Hypertension Brother   ? Diabetes Brother   ? Hyperlipidemia Brother   ? Hypertension Brother   ? Hyperlipidemia Brother   ? Hypertension Brother   ? Colon cancer Neg Hx   ? Rectal cancer Neg Hx   ? Stomach cancer Neg Hx   ? ? ?Social History  ? ?Socioeconomic History  ?  Marital status: Married  ?  Spouse name: Not on file  ? Number of children: 2  ? Years of education: Not on file  ? Highest education level: Not on file  ?Occupational History  ? Occupation: Charity fundraiser  ?  Comment: Retired  ? Occupation: Pension scheme manager  ?  Comment: Retired  ?Tobacco Use  ? Smoking status: Former  ?  Types: Cigarettes  ?  Quit date: 02/02/2015  ?  Years since quitting: 6.4  ? Smokeless tobacco: Never  ?Substance and Sexual Activity  ? Alcohol use: No  ?  Alcohol/week: 0.0 standard drinks  ? Drug use: No  ? Sexual activity: Not on file  ?Other Topics Concern  ? Not on file  ?Social History Narrative  ? Divorced long ago  ? Long term relationship since 2010  ? Engaged for 2020  ?   ? Has living will  ? [name] should make decisions  ? Would accept resuscitation  ? Would accept tube feeds short term--- but not if cognitively unaware  ? ?Social Determinants of Health  ? ?Financial Resource Strain: Not on file  ?Food Insecurity: Not on file  ?Transportation Needs: Not on file  ?Physical Activity: Not on file  ?Stress: Not on file  ?  Social Connections: Not on file  ?Intimate Partner Violence: Not on file  ? ?Review of Systems ?No fevers ?Not sick ? ?   ?Objective:  ? Physical Exam ?Constitutional:   ?   Appearance: Normal appearance.  ?Musculoskeletal:  ?   Comments: Right wrist and CMC all normal ?PIP is hypertrophic---large bony prominence compared to left ?Not particularly tender  ?Neurological:  ?   Mental Status: Juan Holmes is alert.  ?  ? ? ? ? ?   ?Assessment & Plan:  ? ?

## 2021-06-27 DIAGNOSIS — R2231 Localized swelling, mass and lump, right upper limb: Secondary | ICD-10-CM | POA: Diagnosis not present

## 2021-07-09 DIAGNOSIS — R2231 Localized swelling, mass and lump, right upper limb: Secondary | ICD-10-CM | POA: Diagnosis not present

## 2021-07-16 DIAGNOSIS — R2231 Localized swelling, mass and lump, right upper limb: Secondary | ICD-10-CM | POA: Diagnosis not present

## 2021-07-20 DIAGNOSIS — J3489 Other specified disorders of nose and nasal sinuses: Secondary | ICD-10-CM | POA: Diagnosis not present

## 2021-07-20 DIAGNOSIS — R220 Localized swelling, mass and lump, head: Secondary | ICD-10-CM | POA: Diagnosis not present

## 2021-07-21 ENCOUNTER — Encounter: Payer: Self-pay | Admitting: Emergency Medicine

## 2021-07-21 DIAGNOSIS — K047 Periapical abscess without sinus: Secondary | ICD-10-CM | POA: Diagnosis not present

## 2021-07-21 DIAGNOSIS — Z8546 Personal history of malignant neoplasm of prostate: Secondary | ICD-10-CM | POA: Diagnosis not present

## 2021-07-21 DIAGNOSIS — R220 Localized swelling, mass and lump, head: Secondary | ICD-10-CM | POA: Diagnosis not present

## 2021-07-21 MED ORDER — IBUPROFEN 800 MG PO TABS
ORAL_TABLET | ORAL | Status: AC
  Filled 2021-07-21: qty 1

## 2021-07-21 MED ORDER — IBUPROFEN 800 MG PO TABS
800.0000 mg | ORAL_TABLET | Freq: Once | ORAL | Status: AC
  Administered 2021-07-21: 800 mg via ORAL

## 2021-07-21 NOTE — ED Triage Notes (Signed)
Pt here from home with swelling to  the left side of her face , had  a cavity filled last Thursday , went to UC and was given prednisone , no fevers  ?

## 2021-07-22 ENCOUNTER — Emergency Department
Admission: EM | Admit: 2021-07-22 | Discharge: 2021-07-22 | Disposition: A | Discharge status: Home / Self Care | Payer: Medicare Other | Attending: Emergency Medicine | Admitting: Emergency Medicine

## 2021-07-22 DIAGNOSIS — R220 Localized swelling, mass and lump, head: Secondary | ICD-10-CM

## 2021-07-22 DIAGNOSIS — K047 Periapical abscess without sinus: Secondary | ICD-10-CM

## 2021-07-22 MED ORDER — CLINDAMYCIN HCL 300 MG PO CAPS: 300.0000 mg | ORAL_CAPSULE | Freq: Three times a day (TID) | ORAL | 0 refills | Status: AC

## 2021-07-22 NOTE — ED Notes (Signed)
Pt Dc to home. DC instructions reviewed with all questions answered. Pt ambulatory out of dept with steady gait ?

## 2021-07-22 NOTE — ED Provider Notes (Signed)
? ?[facility] ?Provider Note ? ? ? Event Date/Time  ? First MD Initiated Contact with Patient 07/22/21 0315   ?  (approximate) ? ? ?History  ? ?Facial Swelling ? ? ?HPI ? ?Juan Holmes is a 70 y.o. male with history of prostate cancer status post prostatectomy who presents to the emergency department with complaints of left-sided facial swelling around the left maxillary area that started today.  States he had a cavity filled on Thursday, May 11.  States it was left upper tooth #13.  Was seen at urgent care on May 13 and was given a prescription of prednisone for this.  States he has taken 2 doses and has not noticed any relief.  Has been taking Tylenol at home.  Was given ibuprofen in triage and states now the swelling is almost gone and the throbbing pain has resolved.  No fevers.  No difficulty swallowing, speaking or breathing.  He has not been on antibiotics. ? ? ? ? ? ?History provided by patient and wife. ? ? ? ?Past Medical History:  ?Diagnosis Date  ? Allergic rhinitis due to pollen   ? Angiodysplasia of cecum 04/03/2015  ? Benign essential tremor 1998  ? Chronic alcohol abuse 2009  ? Colon polyps 2011  ? adenomatous  ? Congenital single kidney   ? ED (erectile dysfunction)   ? Prostate cancer [facility]) 2010  ? total prostatectomy  ? ? ?Past Surgical History:  ?Procedure Laterality Date  ? COLONOSCOPY    ? KNEE ARTHROSCOPY Right 2012  ? ROBOT ASSISTED LAPAROSCOPIC RADICAL PROSTATECTOMY N/A 2010  ? [facility] urology   ? VASECTOMY  1977  ? ? ?MEDICATIONS:  ?Prior to Admission medications   ?Medication Sig Start Date End Date Taking? Authorizing Provider  ?propranolol (INDERAL) 40 MG tablet Take 1 tablet (40 mg total) by mouth 3 (three) times daily. 07/19/20   Venia Carbon, MD  ?sildenafil (REVATIO) 20 MG tablet TAKE 3 TO 5 TABLETS BY MOUTH DAILY AS NEEDED 07/20/20   Venia Carbon, MD  ?vardenafil (LEVITRA) 20 MG tablet Take 20 mg by mouth daily as needed for erectile dysfunction.   08/31/12  [provider]  ? ? ?Physical Exam  ? ?Triage Vital Signs: ?ED Triage Vitals  ?Enc Vitals Group  ?   BP 07/21/21 2339 (!) 190/106  ?   Pulse Rate 07/21/21 2339 (!) 54  ?   Resp --   ?   Temp 07/21/21 2339 98.3 ?F (36.8 ?C)  ?   Temp Source 07/21/21 2339 Oral  ?   SpO2 07/21/21 2339 91 %  ?   Weight 07/21/21 2344 189 lb (85.7 kg)  ?   Height 07/21/21 2344 6' (1.829 m)  ?   Head Circumference --   ?   Peak Flow --   ?   Pain Score 07/21/21 2343 5  ?   Pain Loc --   ?   Pain Edu? --   ?   Excl. in [place]? --   ? ? ?Most recent vital signs: ?Vitals:  ? 07/21/21 2349 07/22/21 0327  ?BP: (!) 171/88 131/65  ?Pulse:  87  ?Resp:  18  ?Temp:  98 ?F (36.7 ?C)  ?SpO2:  99%  ? ? ?CONSTITUTIONAL: Alert and oriented and responds appropriately to questions. Well-appearing; well-nourished, extremely pleasant, appears younger than stated age ?HEAD: Normocephalic, atraumatic ?EYES: Conjunctivae clear, pupils appear equal, sclera nonicteric ?ENT: normal nose; moist mucous membranes; Dental caries absent, no drainable  dental abscess noted, gums appear normal without bleeding, no Ludwig's angina, tongue sits flat in the bottom of the mouth, no angioedema, no facial erythema or warmth, moves neck without difficulty, normal phonation without stridor, trismus or drooling.  Patient has very minimal swelling over the left maxillary area that is nontender to palpation. ?NECK: Supple, normal ROM ?CARD: RRR ?RESP: Normal chest excursion without splinting or tachypnea; speaking full sentences, no respiratory distress ?ABD/GI: Nondistended ?SKIN: Normal color for age and race; warm; no rash on exposed skin ?NEURO: Moves all extremities equally, normal speech ?PSYCH: The patient's mood and manner are appropriate. ? ? ?ED Results / Procedures / Treatments  ? ?LABS: ?(all labs ordered are listed, but only abnormal results are displayed) ?Labs Reviewed - No data to display ? ? ?EKG: ? ? ? ? ?RADIOLOGY: ?My personal review and  interpretation of imaging:   ? ?I have personally reviewed all radiology reports.   ?No results found. ? ? ?PROCEDURES: ? ?Critical Care performed: No ? ? ? ?Procedures ? ? ? ?IMPRESSION / MDM / ASSESSMENT AND PLAN / ED COURSE  ?I reviewed the triage vital signs and the nursing notes. ? ? ? ?Patient here with throbbing dental and facial pain with left-sided facial swelling after having a cavity filled several days ago. ? ?The patient is on the cardiac monitor to evaluate for evidence of arrhythmia and/or significant heart rate changes. ? ? ?DIFFERENTIAL DIAGNOSIS (includes but not limited to):   Dental abscess, dental caries, sinusitis ? ? ?PLAN: Patient here with facial swelling and throbbing that improved after ibuprofen.  No sign of facial cellulitis or abscess on exam.  No sign of drainable dental abscess.  No Ludwigs.  Patient is extremely well-appearing, nontoxic and afebrile.  Recommended continuing alternating Tylenol and Motrin at home.  I recommended that he stop his prednisone as he does not feel it is helping him.  We will start him on antibiotics for possible infection and have encouraged him to follow-up with his dentist tomorrow.  He is allergic to penicillin.  We will start him on clindamycin but have encouraged him to take a probiotic while on this antibiotic. ? ? ?MEDICATIONS GIVEN IN ED: ?Medications  ?ibuprofen (ADVIL) 800 MG tablet (has no administration in time range)  ?ibuprofen (ADVIL) tablet 800 mg (800 mg Oral Given 07/21/21 2351)  ? ? ? ?ED COURSE:  At this time, I do not feel there is any life-threatening condition present. I reviewed all nursing notes, vitals, pertinent previous records.  All lab and urine results, EKGs, imaging ordered have been independently reviewed and interpreted by myself.  I reviewed all available radiology reports from any imaging ordered this visit.  Based on my assessment, I feel the patient is safe to be discharged home without further emergent workup and can  continue workup as an outpatient as needed. Discussed all findings, treatment plan as well as usual and customary return precautions with patient and wife.  They verbalize understanding and are comfortable with this plan.  Outpatient follow-up has been provided as needed.  All questions have been answered. ? ? ? ?CONSULTS: No emergent dental consult needed at this time given patient is well-appearing without signs of airway obstruction, Ludwigs. ? ? ?OUTSIDE RECORDS REVIEWED: Reviewed office visit with Donetta Potts on 07/20/2021. ? ? ? ? ? ? ? ? ?FINAL CLINICAL IMPRESSION(S) / ED DIAGNOSES  ? ?Final diagnoses:  ?Facial swelling  ?Dental infection  ? ? ? ?Rx / DC Orders  ? ?  ED Discharge Orders   ? ?      Ordered  ?  clindamycin (CLEOCIN) 300 MG capsule  3 times daily       ? 07/22/21 0316  ? ?  ?  ? ?  ? ? ? ?Note:  This document was prepared using Dragon voice recognition software and may include unintentional dictation errors. ?  ?Seaver Machia, Delice Bison, DO ?07/22/21 7096 ? ?

## 2021-07-22 NOTE — Discharge Instructions (Signed)
You may alternate Tylenol 1000 mg every 6 hours as needed for pain, fever and Ibuprofen 800 mg every 8 hours as needed for pain, fever.  Please take Ibuprofen with food.  Do not take more than 4000 mg of Tylenol (acetaminophen) in a 24 hour period. ? ? ?Start a probiotic when taking antibiotics. ? ?Please stop the prednisone. ? ?Please call your dentist in the morning.  ?

## 2021-07-24 ENCOUNTER — Telehealth: Payer: Self-pay

## 2021-07-24 NOTE — Telephone Encounter (Signed)
Went to UC on 5-13. Put him on prednisone. Ended up at ER 5-15. Antibiotics are really working now. ?

## 2021-08-01 DIAGNOSIS — D225 Melanocytic nevi of trunk: Secondary | ICD-10-CM | POA: Diagnosis not present

## 2021-08-01 DIAGNOSIS — L820 Inflamed seborrheic keratosis: Secondary | ICD-10-CM | POA: Diagnosis not present

## 2021-08-01 DIAGNOSIS — Z85828 Personal history of other malignant neoplasm of skin: Secondary | ICD-10-CM | POA: Diagnosis not present

## 2021-08-01 DIAGNOSIS — D2261 Melanocytic nevi of right upper limb, including shoulder: Secondary | ICD-10-CM | POA: Diagnosis not present

## 2021-08-01 DIAGNOSIS — L538 Other specified erythematous conditions: Secondary | ICD-10-CM | POA: Diagnosis not present

## 2021-08-01 DIAGNOSIS — D2272 Melanocytic nevi of left lower limb, including hip: Secondary | ICD-10-CM | POA: Diagnosis not present

## 2021-08-01 DIAGNOSIS — L570 Actinic keratosis: Secondary | ICD-10-CM | POA: Diagnosis not present

## 2021-08-01 DIAGNOSIS — X32XXXA Exposure to sunlight, initial encounter: Secondary | ICD-10-CM | POA: Diagnosis not present

## 2021-08-07 ENCOUNTER — Telehealth: Payer: Self-pay

## 2021-08-07 NOTE — Telephone Encounter (Signed)
Spoke to pt. Made appt.

## 2021-08-07 NOTE — Telephone Encounter (Signed)
Patient called in stating he had a tooth ache a few days ago, was told by dentist that they couldn't do anything so patient went to ED and was prescribed Clindamycin. Since taking it Juan Holmes has diarrhea, was advised from orthodontist to advise PCP in case they needed to make any changes to medications.

## 2021-08-08 ENCOUNTER — Ambulatory Visit: Payer: Medicare Other | Admitting: Internal Medicine

## 2021-08-12 ENCOUNTER — Encounter: Payer: Self-pay | Admitting: Internal Medicine

## 2021-08-12 ENCOUNTER — Encounter: Payer: Self-pay | Admitting: Nurse Practitioner

## 2021-08-12 ENCOUNTER — Ambulatory Visit (INDEPENDENT_AMBULATORY_CARE_PROVIDER_SITE_OTHER): Payer: Medicare Other | Admitting: Nurse Practitioner

## 2021-08-12 VITALS — BP 140/86 | HR 58 | Temp 98.1°F | Resp 14 | Ht 72.0 in | Wt 181.0 lb

## 2021-08-12 DIAGNOSIS — R197 Diarrhea, unspecified: Secondary | ICD-10-CM | POA: Insufficient documentation

## 2021-08-12 DIAGNOSIS — K921 Melena: Secondary | ICD-10-CM | POA: Diagnosis not present

## 2021-08-12 LAB — COMPREHENSIVE METABOLIC PANEL
ALT: 17 U/L (ref 0–53)
AST: 8 U/L (ref 0–37)
Albumin: 4.1 g/dL (ref 3.5–5.2)
Alkaline Phosphatase: 62 U/L (ref 39–117)
BUN: 19 mg/dL (ref 6–23)
CO2: 28 mEq/L (ref 19–32)
Calcium: 9.8 mg/dL (ref 8.4–10.5)
Chloride: 103 mEq/L (ref 96–112)
Creatinine, Ser: 1.14 mg/dL (ref 0.40–1.50)
GFR: 65.27 mL/min (ref >60.00)
Glucose, Bld: 55 mg/dL — ABNORMAL LOW (ref 70–99)
Potassium: 4.6 mEq/L (ref 3.5–5.1)
Sodium: 137 mEq/L (ref 135–145)
Total Bilirubin: 0.5 mg/dL (ref 0.2–1.2)
Total Protein: 7 g/dL (ref 6.0–8.3)

## 2021-08-12 LAB — HEMOCCULT GUIAC POC 1CARD (OFFICE): Fecal Occult Blood, POC: NEGATIVE

## 2021-08-12 LAB — CBC
HCT: 50.7 % (ref 39.0–52.0)
Hemoglobin: 17.1 g/dL — ABNORMAL HIGH (ref 13.0–17.0)
MCHC: 33.7 g/dL (ref 30.0–36.0)
MCV: 93.4 fl (ref 78.0–100.0)
Platelets: 256 10*3/uL (ref 150.0–400.0)
RBC: 5.43 Mil/uL (ref 4.22–5.81)
RDW: 14.9 % (ref 11.5–15.5)
WBC: 5.9 10*3/uL (ref 4.0–10.5)

## 2021-08-12 NOTE — Patient Instructions (Signed)
Nice to see you today I will be in touch with the labs once I have them Follow up if no improvement or if symptoms worsen

## 2021-08-12 NOTE — Assessment & Plan Note (Signed)
Patient states 1 episode of blood in stool.  Hemoccult negative in office today.  Pending labs patient currently due for colonoscopy did discuss this with him in office today

## 2021-08-12 NOTE — Progress Notes (Signed)
Acute Office Visit  Subjective:     Patient ID: Juan Holmes, male    DOB: 26-Jul-1950, 71 y.o.   MRN: [id number]  Chief Complaint  Patient presents with   Diarrhea    After taking Clindamycin 5/15/-08/01/21, that's when diarrhea started. He was treated for dental/facial infection. Last night noticed a little blood in the stool.      Patient is in today for diarrhea  States that he had an abscess tooth took 10 days of clindamycin. States that he has been having softer stools since day 6 of antibiotic use. States that he noticed blood in the stool today.  He is not having any abdominal pain, nausea, and/or vomiting.  Review of Systems  Constitutional:  Negative for chills and fever.  Gastrointestinal:  Positive for diarrhea. Negative for abdominal pain, nausea and vomiting.       BM 4-6 small amounts  Traditionally once a day  Genitourinary:  Negative for dysuria and hematuria.  Neurological:  Negative for dizziness.       Objective:    BP 140/86   Pulse (!) 58   Temp 98.1 F (36.7 C)   Resp 14   Ht 6' (1.829 m)   Wt 181 lb (82.1 kg)   SpO2 95%   BMI 24.55 kg/m    Physical Exam Vitals and nursing note reviewed. Exam conducted with a chaperone present [facility], RMA).  Constitutional:      Appearance: Normal appearance.  Cardiovascular:     Rate and Rhythm: Normal rate and regular rhythm.     Pulses: Normal pulses.     Heart sounds: Normal heart sounds.  Pulmonary:     Effort: Pulmonary effort is normal.     Breath sounds: Normal breath sounds.  Abdominal:     General: Bowel sounds are normal. There is no distension.     Palpations: There is no mass.     Tenderness: There is no abdominal tenderness.     Hernia: No hernia is present.  Genitourinary:    Rectum: Normal. Guaiac result negative. No mass, tenderness, anal fissure, external hemorrhoid or internal hemorrhoid. Normal anal tone.  Neurological:     Mental Status: He is alert.    Results  for orders placed or performed in visit on 08/12/21  Hemoccult - 1 Card (office)  Result Value Ref Range   Fecal Occult Blood, POC Negative Negative   Card #1 Date     Card #2 Fecal Occult Blod, POC     Card #2 Date     Card #3 Fecal Occult Blood, POC     Card #3 Date          Assessment & Plan:   Problem List Items Addressed This Visit       Digestive   Diarrhea of presumed infectious origin - Primary    From day 6 forward of antibiotic use looser softer stools.  We will send off for C. difficile.  Pending basic labs today       Relevant Orders   C. difficile GDH and Toxin A/B   CBC   Comprehensive metabolic panel     Other   Blood in stool    Patient states 1 episode of blood in stool.  Hemoccult negative in office today.  Pending labs patient currently due for colonoscopy did discuss this with him in office today       Relevant Orders   CBC   Comprehensive metabolic panel   Hemoccult -  1 Card (office) (Completed)    No orders of the defined types were placed in this encounter.   Return if symptoms worsen or fail to improve.  Romilda Garret, NP

## 2021-08-12 NOTE — Assessment & Plan Note (Signed)
From day 6 forward of antibiotic use looser softer stools.  We will send off for C. difficile.  Pending basic labs today

## 2021-08-13 LAB — C. DIFFICILE GDH AND TOXIN A/B
GDH ANTIGEN: DETECTED
MICRO NUMBER:: 13483652
SPECIMEN QUALITY:: ADEQUATE
TOXIN A AND B: DETECTED

## 2021-08-14 ENCOUNTER — Other Ambulatory Visit: Payer: Self-pay | Admitting: Nurse Practitioner

## 2021-08-14 ENCOUNTER — Encounter: Payer: Self-pay | Admitting: Nurse Practitioner

## 2021-08-14 DIAGNOSIS — A498 Other bacterial infections of unspecified site: Secondary | ICD-10-CM

## 2021-08-14 MED ORDER — FIDAXOMICIN 200 MG PO TABS: 200.0000 mg | ORAL_TABLET | Freq: Two times a day (BID) | ORAL | 0 refills | Status: DC

## 2021-08-14 MED ORDER — VANCOMYCIN HCL 125 MG PO CAPS: 125.0000 mg | ORAL_CAPSULE | Freq: Four times a day (QID) | ORAL | 0 refills | Status: AC

## 2021-09-16 ENCOUNTER — Ambulatory Visit: Payer: Medicare Other | Admitting: Podiatry

## 2021-09-16 ENCOUNTER — Encounter: Payer: Self-pay | Admitting: Podiatry

## 2021-09-16 DIAGNOSIS — L600 Ingrowing nail: Secondary | ICD-10-CM

## 2021-09-16 MED ORDER — NEOMYCIN-POLYMYXIN-HC 1 % OT SOLN: OTIC | 0 refills | Status: DC

## 2021-09-16 NOTE — Patient Instructions (Signed)

## 2021-09-16 NOTE — Progress Notes (Signed)
  Subjective:  Patient ID: Juan Holmes, male    DOB: [birth year],  MRN: [id number]  Chief Complaint  Patient presents with   Ingrown Toenail    (np) re-est care, R ingrown toe nail, great toe    71 y.o. male presents with the above complaint. History confirmed with patient.  Previously had right lateral removed by Dr. Milinda Pointer a couple years ago this is doing fine now, right medial is bothering him now  Objective:  Physical Exam: warm, good capillary refill, no trophic changes or ulcerative lesions, normal DP and PT pulses, and normal sensory exam. Left Foot: normal exam, no swelling, tenderness, instability; ligaments intact, full range of motion of all ankle/foot joints Right Foot:  Ingrown medial nail hallux border, no paronychia   Assessment:   1. Ingrowing right great toenail      Plan:  Patient was evaluated and treated and all questions answered.    Ingrown Nail, right -Patient elects to proceed with minor surgery to remove ingrown toenail today. Consent reviewed and signed by patient. -Ingrown nail excised. See procedure note. -Educated on post-procedure care including soaking. Written instructions provided and reviewed. -Patient to follow up as needed Procedure: Excision of Ingrown Toenail Location: Right 1st toe medial nail borders. Anesthesia: Lidocaine 1% plain; 1.5 mL and Marcaine 0.5% plain; 1.5 mL, digital block. Skin Prep: Betadine. Dressing: Silvadene; telfa; dry, sterile, compression dressing. Technique: Following skin prep, the toe was exsanguinated and a tourniquet was secured at the base of the toe. The affected nail border was freed, split with a nail splitter, and excised. Chemical matrixectomy was then performed with phenol and irrigated out with alcohol. The tourniquet was then removed and sterile dressing applied. Disposition: Patient tolerated procedure well.  Return if symptoms worsen or fail to improve.

## 2021-09-23 DIAGNOSIS — H524 Presbyopia: Secondary | ICD-10-CM | POA: Diagnosis not present

## 2021-09-23 DIAGNOSIS — H0288A Meibomian gland dysfunction right eye, upper and lower eyelids: Secondary | ICD-10-CM | POA: Diagnosis not present

## 2021-09-23 DIAGNOSIS — H52223 Regular astigmatism, bilateral: Secondary | ICD-10-CM | POA: Diagnosis not present

## 2021-09-23 DIAGNOSIS — H5203 Hypermetropia, bilateral: Secondary | ICD-10-CM | POA: Diagnosis not present

## 2021-09-23 DIAGNOSIS — H0288B Meibomian gland dysfunction left eye, upper and lower eyelids: Secondary | ICD-10-CM | POA: Diagnosis not present

## 2021-10-14 ENCOUNTER — Other Ambulatory Visit: Payer: Self-pay | Admitting: Internal Medicine

## 2022-01-05 DIAGNOSIS — Z0000 Encounter for general adult medical examination without abnormal findings: Secondary | ICD-10-CM | POA: Diagnosis not present

## 2022-01-13 ENCOUNTER — Other Ambulatory Visit: Payer: Self-pay | Admitting: Internal Medicine

## 2022-02-03 ENCOUNTER — Other Ambulatory Visit: Payer: Self-pay | Admitting: Internal Medicine

## 2022-04-22 ENCOUNTER — Other Ambulatory Visit: Payer: Self-pay | Admitting: Internal Medicine

## 2022-04-22 NOTE — Telephone Encounter (Signed)
Propranolol prescription sent to pharmacy as requested. Pt needs a CPE before next refill. Please help him get scheduled. Thanks.

## 2022-04-22 NOTE — Telephone Encounter (Signed)
Lvmtcb, sent mychart message

## 2022-04-24 ENCOUNTER — Ambulatory Visit: Payer: Medicare Other | Admitting: Dermatology

## 2022-05-05 DIAGNOSIS — H16142 Punctate keratitis, left eye: Secondary | ICD-10-CM | POA: Diagnosis not present

## 2022-05-05 DIAGNOSIS — H11122 Conjunctival concretions, left eye: Secondary | ICD-10-CM | POA: Diagnosis not present

## 2022-05-08 ENCOUNTER — Encounter: Payer: Self-pay | Admitting: Internal Medicine

## 2022-05-08 ENCOUNTER — Ambulatory Visit: Payer: Medicare Other | Admitting: Dermatology

## 2022-05-08 VITALS — BP 161/90 | HR 52

## 2022-05-08 DIAGNOSIS — L080 Pyoderma: Secondary | ICD-10-CM | POA: Diagnosis not present

## 2022-05-08 DIAGNOSIS — L719 Rosacea, unspecified: Secondary | ICD-10-CM

## 2022-05-08 DIAGNOSIS — Z1283 Encounter for screening for malignant neoplasm of skin: Secondary | ICD-10-CM

## 2022-05-08 DIAGNOSIS — D229 Melanocytic nevi, unspecified: Secondary | ICD-10-CM | POA: Diagnosis not present

## 2022-05-08 DIAGNOSIS — Z79899 Other long term (current) drug therapy: Secondary | ICD-10-CM | POA: Diagnosis not present

## 2022-05-08 DIAGNOSIS — L578 Other skin changes due to chronic exposure to nonionizing radiation: Secondary | ICD-10-CM

## 2022-05-08 DIAGNOSIS — D1801 Hemangioma of skin and subcutaneous tissue: Secondary | ICD-10-CM

## 2022-05-08 DIAGNOSIS — L821 Other seborrheic keratosis: Secondary | ICD-10-CM

## 2022-05-08 DIAGNOSIS — L814 Other melanin hyperpigmentation: Secondary | ICD-10-CM | POA: Diagnosis not present

## 2022-05-08 DIAGNOSIS — D485 Neoplasm of uncertain behavior of skin: Secondary | ICD-10-CM

## 2022-05-08 MED ORDER — DOXYCYCLINE HYCLATE 20 MG PO TABS: ORAL_TABLET | ORAL | 2 refills | Status: DC

## 2022-05-08 NOTE — Patient Instructions (Addendum)
Electrodesiccation and Curettage ("Scrape and Burn") Wound Care Instructions  Leave the original bandage on for 24 hours if possible.  If the bandage becomes soaked or soiled before that time, it is OK to remove it and examine the wound.  A small amount of post-operative bleeding is normal.  If excessive bleeding occurs, remove the bandage, place gauze over the site and apply continuous pressure (no peeking) over the area for 30 minutes. If this does not work, please call our clinic as soon as possible or page your doctor if it is after hours.   Once a day, cleanse the wound with soap and water. It is fine to shower. If a thick crust develops you may use a Q-tip dipped into dilute hydrogen peroxide (mix 1:1 with water) to dissolve it.  Hydrogen peroxide can slow the healing process, so use it only as needed.    After washing, apply petroleum jelly (Vaseline) or an antibiotic ointment if your doctor prescribed one for you, followed by a bandage.    For best healing, the wound should be covered with a layer of ointment at all times. If you are not able to keep the area covered with a bandage to hold the ointment in place, this may mean re-applying the ointment several times a day.  Continue this wound care until the wound has healed and is no longer open. It may take several weeks for the wound to heal and close.  Itching and mild discomfort is normal during the healing process.  If you have any discomfort, you can take Tylenol (acetaminophen) or ibuprofen as directed on the bottle. (Please do not take these if you have an allergy to them or cannot take them for another reason).  Some redness, tenderness and white or yellow material in the wound is normal healing.  If the area becomes very sore and red, or develops a thick yellow-green material (pus), it may be infected; please notify us.    Wound healing continues for up to one year following surgery. It is not unusual to experience pain in the scar  from time to time during the interval.  If the pain becomes severe or the scar thickens, you should notify the office.    A slight amount of redness in a scar is expected for the first six months.  After six months, the redness will fade and the scar will soften and fade.  The color difference becomes less noticeable with time.  If there are any problems, return for a post-op surgery check at your earliest convenience.  To improve the appearance of the scar, you can use silicone scar gel, cream, or sheets (such as Mederma or Serica) every night for up to one year. These are available over the counter (without a prescription).  Please call our office at [phone] for any questions or concerns.   Rosacea  What is rosacea? Rosacea (say: ro-zay-sha) is a common skin disease that usually begins as a trend of flushing or blushing easily.  As rosacea progresses, a persistent redness in the center of the face will develop and may gradually spread beyond the nose and cheeks to the forehead and chin.  In some cases, the ears, chest, and back could be affected.  Rosacea may appear as tiny blood vessels or small red bumps that occur in crops.  Frequently they can contain pus, and are called "pustules".  If the bumps do not contain pus, they are referred to as "papules".  Rarely, in prolonged,  untreated cases of rosacea, the oil glands of the nose and cheeks may become permanently enlarged.  This is called rhinophyma, and is seen more frequently in men.  Signs and Risks In its beginning stages, rosacea tends to come and go, which makes it difficult to recognize.  It can start as intermittent flushing of the face.  Eventually, blood vessels may become permanently visible.  Pustules and papules can appear, but can be mistaken for adult acne.  People of all races, ages, genders and ethnic groups are at risk of developing rosacea.  However, it is more common in women (especially around menopause) and adults with  fair skin between the ages of 34 and 59.  Treatment Dermatologists typically recommend a combination of treatments to effectively manage rosacea.  Treatment can improve symptoms and may stop the progression of the rosacea.  Treatment may involve both topical and oral medications.  The tetracycline antibiotics are often used for their anti-inflammatory effect; however, because of the possibility of developing antibiotic resistance, they should not be used long term at full dose.  For dilated blood vessels the options include electrodessication (uses electric current through a small needle), laser treatment, and cosmetics to hide the redness.   With all forms of treatment, improvement is a slow process, and patients may not see any results for the first 3-4 weeks.  It is very important to avoid the sun and other triggers.  Patients must wear sunscreen daily.  Skin Care Instructions: Cleanse the skin with a mild soap such as CeraVe cleanser, Cetaphil cleanser, or Dove soap once or twice daily as needed. Moisturize with Eucerin Redness Relief Daily Perfecting Lotion (has a subtle green tint), CeraVe Moisturizing Cream, or Oil of Olay Daily Moisturizer with sunscreen every morning and/or night as recommended. Makeup should be "non-comedogenic" (won't clog pores) and be labeled "for sensitive skin". Good choices for cosmetics are: Neutrogena, Almay, and Physician's Formula.  Any product with a green tint tends to offset a red complexion. If your eyes are dry and irritated, use artificial tears 2-3 times per day and cleanse the eyelids daily with baby shampoo.  Have your eyes examined at least every 2 years.  Be sure to tell your eye doctor that you have rosacea. Alcoholic beverages tend to cause flushing of the skin, and may make rosacea worse. Always wear sunscreen, protect your skin from extreme hot and cold temperatures, and avoid spicy foods, hot drinks, and mechanical irritation such as rubbing,  scrubbing, or massaging the face.  Avoid harsh skin cleansers, cleansing masks, astringents, and exfoliation. If a particular product burns or makes your face feel tight, then it is likely to flare your rosacea. If you are having difficulty finding a sunscreen that you can tolerate, you may try switching to a chemical-free sunscreen.  These are ones whose active ingredient is zinc oxide or titanium dioxide only.  They should also be fragrance free, non-comedogenic, and labeled for sensitive skin. Rosacea triggers may vary from person to person.  There are a variety of foods that have been reported to trigger rosacea.  Some patients find that keeping a diary of what they were doing when they flared helps them avoid triggers.  Doxycycline should be taken with food to prevent nausea. Do not lay down for 30 minutes after taking. Be cautious with sun exposure and use good sun protection while on this medication. Pregnant women should not take this medication.    Due to recent changes in healthcare laws, you may  see results of your pathology and/or laboratory studies on MyChart before the doctors have had a chance to review them. We understand that in some cases there may be results that are confusing or concerning to you. Please understand that not all results are received at the same time and often the doctors may need to interpret multiple results in order to provide you with the best plan of care or course of treatment. Therefore, we ask that you please give [place] 2 business days to thoroughly review all your results before contacting the office for clarification. Should we see a critical lab result, you will be contacted sooner.   If You Need Anything After Your Visit  If you have any questions or concerns for your doctor, please call our main line at [phone] and press option 4 to reach your doctor's medical assistant. If no one answers, please leave a voicemail as directed and we will return your call  as soon as possible. Messages left after 4 pm will be answered the following business day.   You may also send [place] a message via [place]. We typically respond to MyChart messages within 1-2 business days.  For prescription refills, please ask your pharmacy to contact our office. Our fax number is [phone].  If you have an urgent issue when the clinic is closed that cannot wait until the next business day, you can page your doctor at the number below.    Please note that while we do our best to be available for urgent issues outside of office hours, we are not available 24/7.   If you have an urgent issue and are unable to reach [place], you may choose to seek medical care at your doctor's office, retail clinic, urgent care center, or emergency room.  If you have a medical emergency, please immediately call 911 or go to the emergency department.  Pager Numbers  - Dr. Nehemiah Massed: [phone]  - Dr. Laurence Ferrari: [phone]  - Dr. Nicole Kindred: [phone]  In the event of inclement weather, please call our main line at [phone] for an update on the status of any delays or closures.  Dermatology Medication Tips: Please keep the boxes that topical medications come in in order to help keep track of the instructions about where and how to use these. Pharmacies typically print the medication instructions only on the boxes and not directly on the medication tubes.   If your medication is too expensive, please contact our office at [phone] option 4 or send [place] a message through [place].   We are unable to tell what your co-pay for medications will be in advance as this is different depending on your insurance coverage. However, we may be able to find a substitute medication at lower cost or fill out paperwork to get insurance to cover a needed medication.   If a prior authorization is required to get your medication covered by your insurance company, please allow [place] 1-2 business days to complete  this process.  Drug prices often vary depending on where the prescription is filled and some pharmacies may offer cheaper prices.  The website www.goodrx.com contains coupons for medications through different pharmacies. The prices here do not account for what the cost may be with help from insurance (it may be cheaper with your insurance), but the website can give you the price if you did not use any insurance.  - You can print the associated coupon and take it with your prescription to the pharmacy.  - You  may also stop by our office during regular business hours and pick up a GoodRx coupon card.  - If you need your prescription sent electronically to a different pharmacy, notify our office through [facility] or by phone at [phone] option 4.     Si Usted Necesita Algo Despus de Su Visita  Tambin puede enviarnos un mensaje a travs de Pharmacist, community. Por lo general respondemos a los mensajes de MyChart en el transcurso de 1 a 2 das hbiles.  Para renovar recetas, por favor pida a su farmacia que se ponga en contacto con nuestra oficina. Harland Dingwall de fax es [place] [phone].  Si tiene un asunto urgente cuando la clnica est cerrada y que no puede esperar hasta el siguiente da hbil, puede llamar/localizar a su doctor(a) al nmero que aparece a continuacin.   Por favor, tenga en cuenta que aunque hacemos todo lo posible para estar disponibles para asuntos urgentes fuera del horario de [place], no estamos disponibles las 24 horas del da, los 7 das de la [place].   Si tiene un problema urgente y no puede comunicarse con nosotros, puede optar por buscar atencin mdica  en el consultorio de su doctor(a), en una clnica privada, en un centro de atencin urgente o en una sala de emergencias.  Si tiene Engineering geologist, por favor llame inmediatamente al 911 o vaya a la sala de emergencias.  Nmeros de bper  - Dr. Nehemiah Massed: [phone]  - Dra. Moye: [phone]  -  Dra. Nicole Kindred: [phone]  En caso de inclemencias del [place], por favor llame a Johnsie Kindred principal al [phone] para una actualizacin sobre el [place] de cualquier retraso o cierre.  Consejos para la medicacin en dermatologa: Por favor, guarde las cajas en las que vienen los medicamentos de uso tpico para ayudarle a seguir las instrucciones sobre dnde y cmo usarlos. Las farmacias generalmente imprimen las instrucciones del medicamento slo en las cajas y no directamente en los tubos del [place].   Si su medicamento es muy caro, por favor, pngase en contacto con Zigmund Daniel llamando al [phone] y presione la opcin 4 o envenos un mensaje a travs de Pharmacist, community.   No podemos decirle cul ser su copago por los medicamentos por adelantado ya que esto es diferente dependiendo de la cobertura de su seguro. Sin embargo, es posible que podamos encontrar un medicamento sustituto a Electrical engineer un formulario para que el seguro cubra el medicamento que se considera necesario.   Si se requiere una autorizacin previa para que su compaa de seguros [place] su medicamento, por favor permtanos de 1 a 2 das hbiles para completar este proceso.  Los precios de los medicamentos varan con frecuencia dependiendo del Environmental consultant de dnde se surte la receta y alguna farmacias pueden ofrecer precios ms baratos.  El sitio web www.goodrx.com tiene cupones para medicamentos de Airline pilot. Los precios aqu no tienen en cuenta lo que podra costar con la ayuda del seguro (puede ser ms barato con su seguro), pero el sitio web puede darle el precio si no utiliz Research scientist (physical sciences).  - Puede imprimir el cupn correspondiente y llevarlo con su receta a la farmacia.  - Tambin puede pasar por nuestra oficina durante el horario de atencin regular y Charity fundraiser una tarjeta de cupones de GoodRx.  - Si necesita que su receta se enve electrnicamente a Chiropodist, informe a nuestra  oficina a travs de MyChart de [place] o por telfono llamando al [phone] y presione  la opcin 4.

## 2022-05-08 NOTE — Progress Notes (Signed)
Follow-Up Visit   Subjective  Juan Holmes is a 72 y.o. male who presents for the following: Annual Exam. The patient presents for Total-Body Skin Exam (TBSE) for skin cancer screening and mole check.  The patient has spots, moles and lesions to be evaluated, some may be new or changing.  The following portions of the chart were reviewed this encounter and updated as appropriate:   Tobacco  Allergies  Meds  Problems  Med Hx  Surg Hx  Fam Hx     Review of Systems:  No other skin or systemic complaints except as noted in HPI or Assessment and Plan.  Objective  Well appearing patient in no apparent distress; mood and affect are within normal limits.  A full examination was performed including scalp, head, eyes, ears, nose, lips, neck, chest, axillae, abdomen, back, buttocks, bilateral upper extremities, bilateral lower extremities, hands, feet, fingers, toes, fingernails, and toenails. All findings within normal limits unless otherwise noted below.  Face Mid face erythema with telangiectasias +/- scattered inflammatory papules.   L inf scapula Crusted papule 1.2 cm.   Assessment & Plan  Rosacea Face With ocular rosacea -  Rosacea is a chronic progressive skin condition usually affecting the face of adults, causing redness and/or acne bumps. It is treatable but not curable. It sometimes affects the eyes (ocular rosacea) as well. It may respond to topical and/or systemic medication and can flare with stress, sun exposure, alcohol, exercise, topical steroids (including hydrocortisone/cortisone 10) and some foods.  Daily application of broad spectrum spf 30+ sunscreen to face is recommended to reduce flares. Counseling for BBL / IPL / Laser and Coordination of Care Discussed the treatment option of Broad Band Light (BBL) /Intense Pulsed Light (IPL)/ Laser for skin discoloration, including brown spots and redness.  Typically we recommend at least 1-3 treatment sessions about 5-8 weeks  apart for best results.  Cannot have tanned skin when BBL performed, and regular use of sunscreen is advised after the procedure to help maintain results. The patient's condition may also require "maintenance treatments" in the future.  The fee for BBL / laser treatments is $350 per treatment session for the whole face.  A fee can be quoted for other parts of the body.  Insurance typically does not pay for BBL/laser treatments and therefore the fee is an out-of-pocket cost.  Start Doxycycline 20 mg po QD with food. Doxycycline should be taken with food to prevent nausea. Do not lay down for 30 minutes after taking. Be cautious with sun exposure and use good sun protection while on this medication. Pregnant women should not take this medication.   doxycycline (PERIOSTAT) 20 MG tablet - Face Take one tab po QD with food.  Neoplasm of uncertain behavior of skin L inf scapula Epidermal / dermal shaving  Lesion diameter (cm):  1.2 Informed consent: discussed and consent obtained   Timeout: patient name, date of birth, surgical site, and procedure verified   Procedure prep:  Patient was prepped and draped in usual sterile fashion Prep type:  Isopropyl alcohol Anesthesia: the lesion was anesthetized in a standard fashion   Anesthetic:  1% lidocaine w/ epinephrine 1-100,000 buffered w/ 8.4% NaHCO3 Instrument used: flexible razor blade   Hemostasis achieved with: pressure, aluminum chloride and electrodesiccation   Outcome: patient tolerated procedure well   Post-procedure details: sterile dressing applied and wound care instructions given   Dressing type: bandage and petrolatum    Destruction of lesion Complexity: extensive   Destruction method:  electrodesiccation and curettage   Informed consent: discussed and consent obtained   Timeout:  patient name, date of birth, surgical site, and procedure verified Procedure prep:  Patient was prepped and draped in usual sterile fashion Prep type:   Isopropyl alcohol Anesthesia: the lesion was anesthetized in a standard fashion   Anesthetic:  1% lidocaine w/ epinephrine 1-100,000 buffered w/ 8.4% NaHCO3 Curettage performed in three different directions: Yes   Electrodesiccation performed over the curetted area: Yes   Lesion length (cm):  1.2 Lesion width (cm):  1.2 Margin per side (cm):  0.2 Final wound size (cm):  1.6 Hemostasis achieved with:  pressure, aluminum chloride and electrodesiccation Outcome: patient tolerated procedure well with no complications   Post-procedure details: sterile dressing applied and wound care instructions given   Dressing type: bandage and petrolatum    Specimen 1 - Surgical pathology Differential Diagnosis: D48.5 r/o BCC vs SCC vs other ED&C today  Check Margins: No  Lentigines - Scattered tan macules - Due to sun exposure - Benign-appearing, observe - Recommend daily broad spectrum sunscreen SPF 30+ to sun-exposed areas, reapply every 2 hours as needed. - Call for any changes  Seborrheic Keratoses - Stuck-on, waxy, tan-brown papules and/or plaques  - Benign-appearing - Discussed benign etiology and prognosis. - Observe - Call for any changes  Melanocytic Nevi - Tan-brown and/or pink-flesh-colored symmetric macules and papules - Benign appearing on exam today - Observation - Call clinic for new or changing moles - Recommend daily use of broad spectrum spf 30+ sunscreen to sun-exposed areas.   Hemangiomas - Red papules - Discussed benign nature - Observe - Call for any changes  Actinic Damage - Chronic condition, secondary to cumulative UV/sun exposure - diffuse scaly erythematous macules with underlying dyspigmentation - Recommend daily broad spectrum sunscreen SPF 30+ to sun-exposed areas, reapply every 2 hours as needed.  - Staying in the shade or wearing long sleeves, sun glasses (UVA+UVB protection) and wide brim hats (4-inch brim around the entire circumference of the hat)  are also recommended for sun protection.  - Call for new or changing lesions.  Skin cancer screening performed today.  Return in about 6 months (around 11/06/2022) for recheck bx site and ocular rosacea.  Luther Redo, CMA, am acting as scribe for Sarina Ser, MD . Documentation: I have reviewed the above documentation for accuracy and completeness, and I agree with the above.  Sarina Ser, MD

## 2022-05-12 ENCOUNTER — Telehealth: Payer: Self-pay

## 2022-05-12 NOTE — Telephone Encounter (Signed)
LMOVM to C/B regarding pathology results.

## 2022-05-12 NOTE — Telephone Encounter (Signed)
-----   Message from Ralene Bathe, MD sent at 05/12/2022  1:44 PM EST ----- Diagnosis Skin , left inf scapula SUPPURATIVE GRANULOMATOUS INFLAMMATION, SEE DESCRIPTION  Benign inflammation Most likely ruptured cyst  No further treatment needed

## 2022-05-16 ENCOUNTER — Encounter: Payer: Self-pay | Admitting: Dermatology

## 2022-05-18 IMAGING — DX DG FINGER THUMB 2+V*R*
3 series · 3 of 3 positions shown · non-contrast
Comparison: None.

CLINICAL DATA: Pain of right thumb; PIP pain and swelling 2 months
after fall

EXAM:
RIGHT THUMB 2+V

[thumb ap]
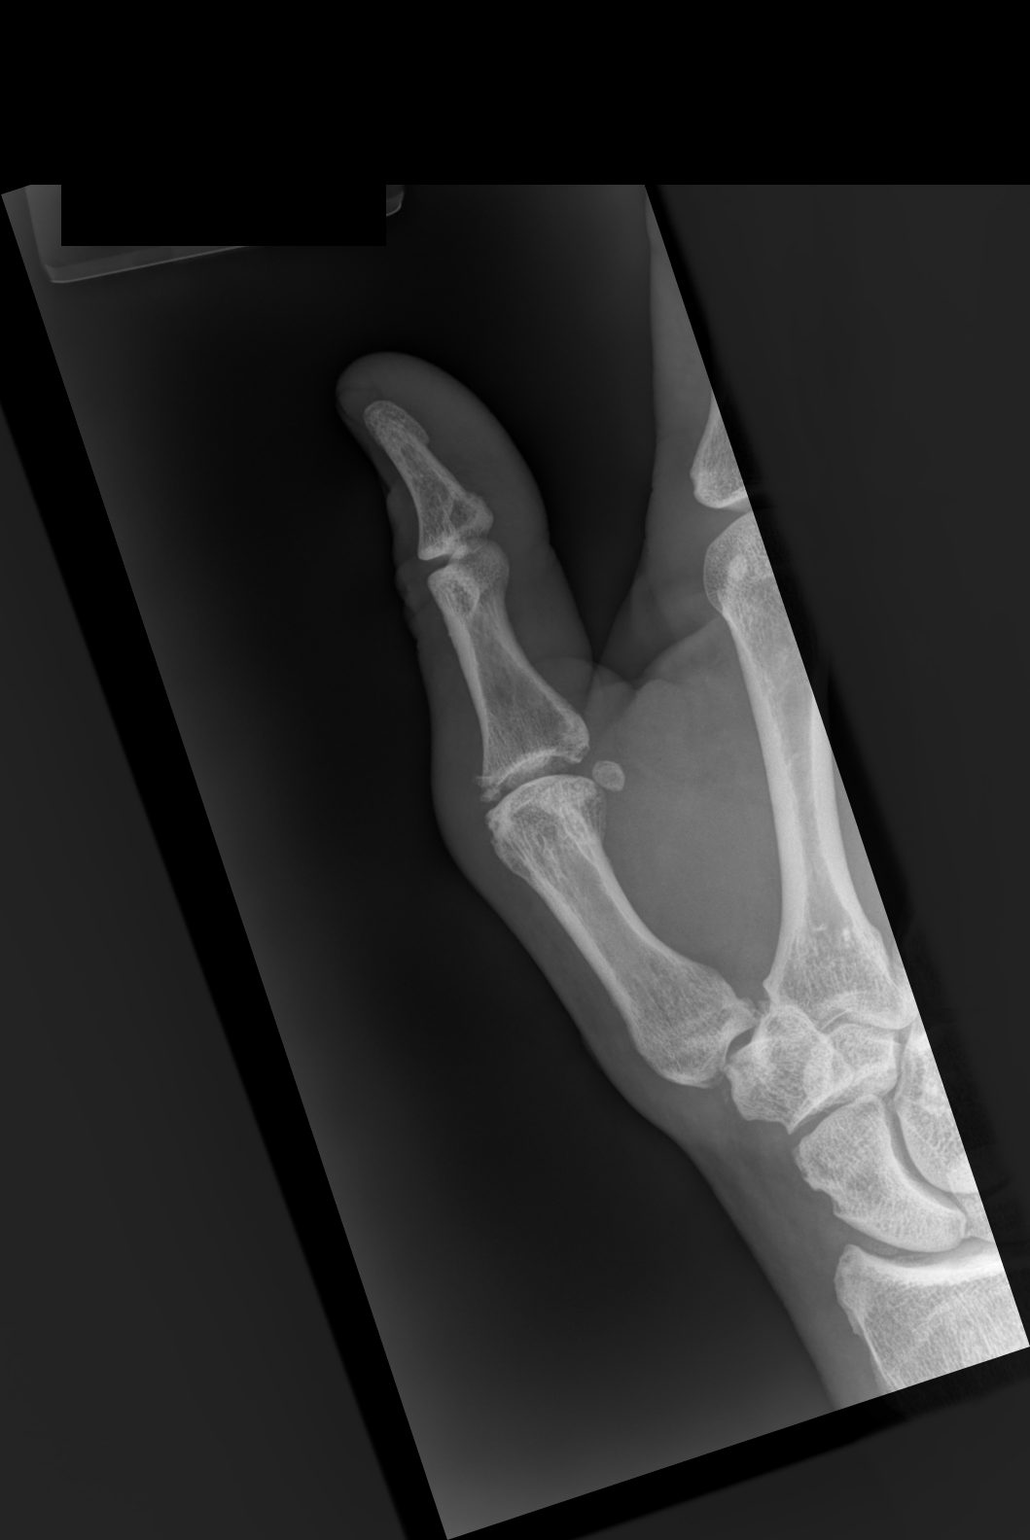

[thumb mlo]
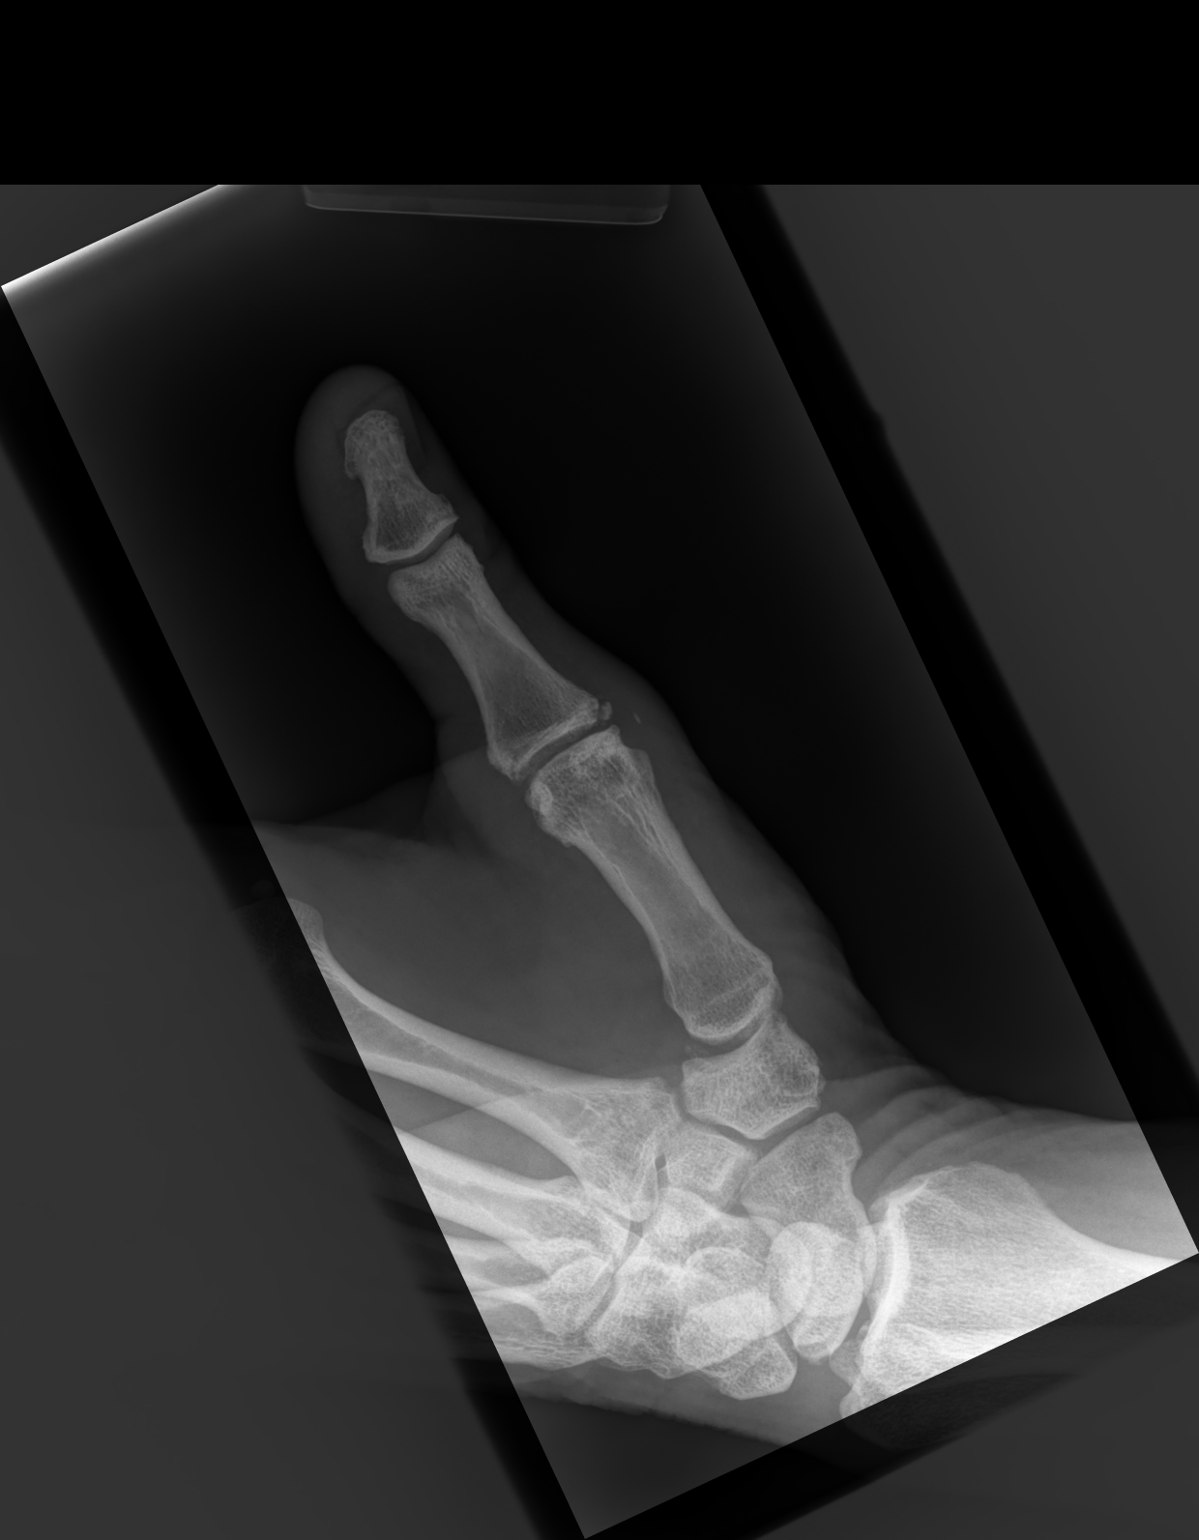

[thumb lat]
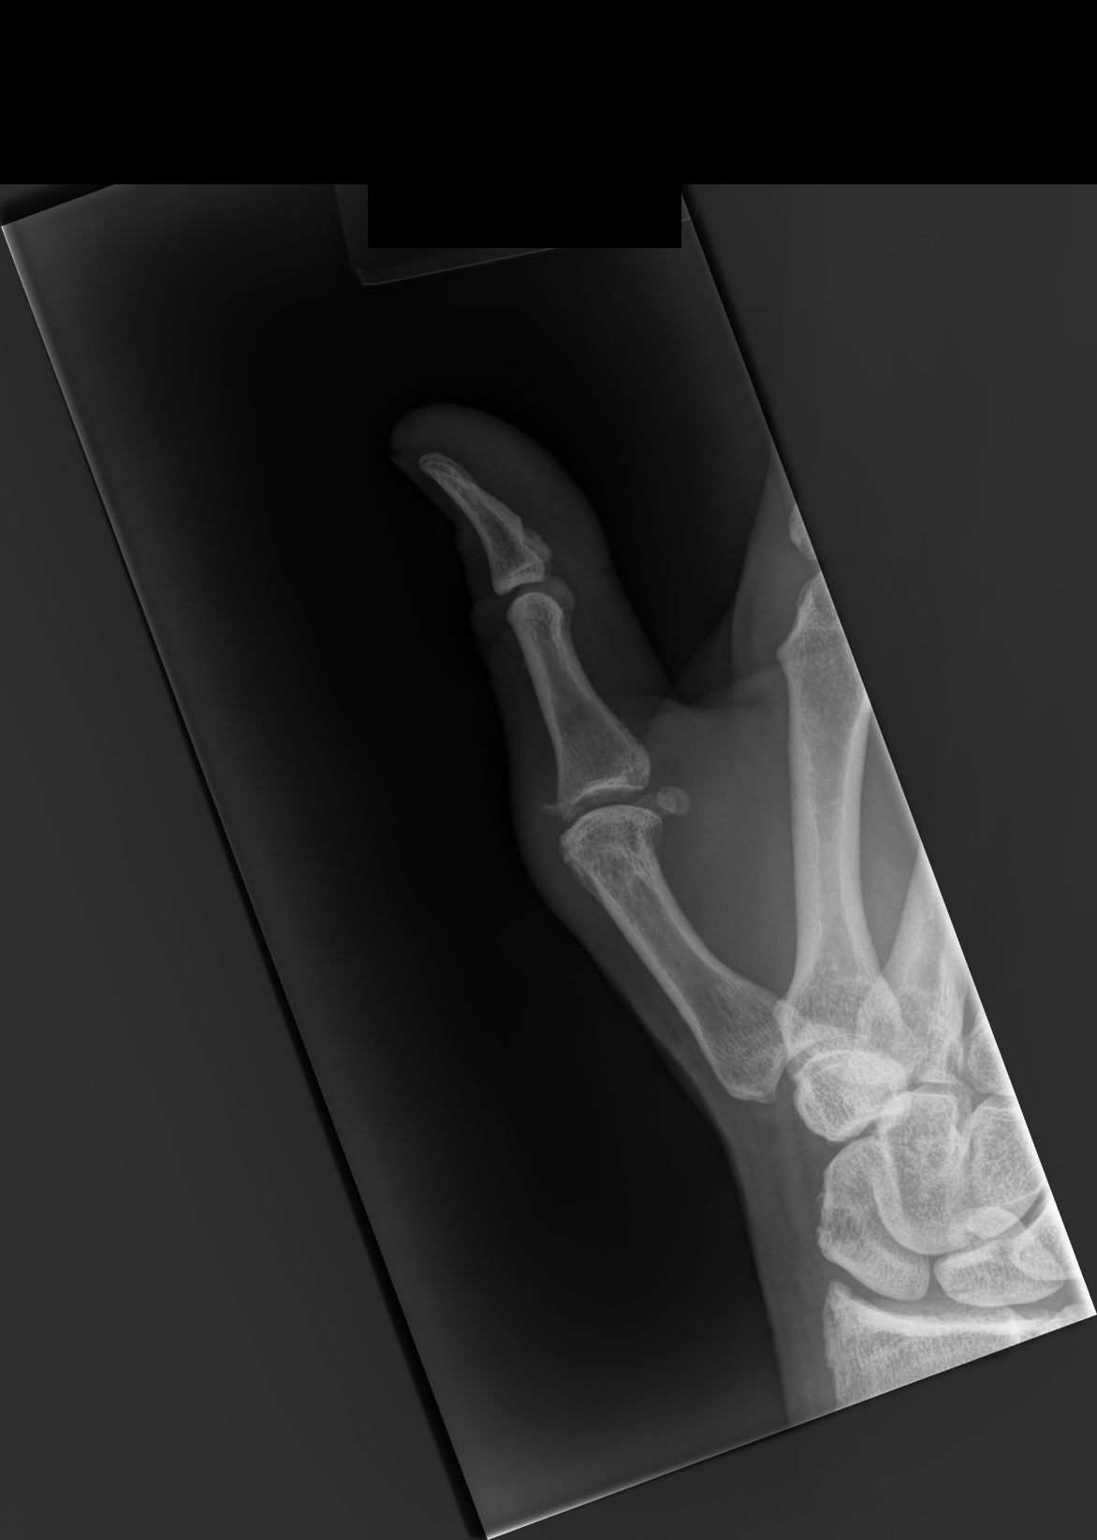

[3 of 3 positions shown; findings below may reference images not displayed]

FINDINGS: No dislocation. Osseous fragment is present at the lateral base of
the proximal phalanx of the first digit. Joint spaces preserved.
IMPRESSION: Osseous fragment at the base of the proximal phalanx of the first
digit may reflect unhealed fracture from the time of reported fall.

## 2022-05-19 ENCOUNTER — Encounter: Payer: Self-pay | Admitting: Internal Medicine

## 2022-05-23 ENCOUNTER — Encounter: Payer: Self-pay | Admitting: Internal Medicine

## 2022-05-23 ENCOUNTER — Ambulatory Visit (INDEPENDENT_AMBULATORY_CARE_PROVIDER_SITE_OTHER): Payer: Medicare Other | Admitting: Internal Medicine

## 2022-05-23 ENCOUNTER — Ambulatory Visit (AMBULATORY_SURGERY_CENTER): Payer: Medicare Other | Admitting: *Deleted

## 2022-05-23 ENCOUNTER — Telehealth: Payer: Self-pay

## 2022-05-23 VITALS — Ht 73.0 in | Wt 180.0 lb

## 2022-05-23 VITALS — BP 132/80 | HR 49 | Temp 97.6°F | Ht 72.0 in | Wt 189.0 lb

## 2022-05-23 DIAGNOSIS — Z0000 Encounter for general adult medical examination without abnormal findings: Secondary | ICD-10-CM

## 2022-05-23 DIAGNOSIS — D751 Secondary polycythemia: Secondary | ICD-10-CM

## 2022-05-23 DIAGNOSIS — F1021 Alcohol dependence, in remission: Secondary | ICD-10-CM

## 2022-05-23 DIAGNOSIS — G250 Essential tremor: Secondary | ICD-10-CM

## 2022-05-23 DIAGNOSIS — L719 Rosacea, unspecified: Secondary | ICD-10-CM

## 2022-05-23 DIAGNOSIS — Z8546 Personal history of malignant neoplasm of prostate: Secondary | ICD-10-CM | POA: Diagnosis not present

## 2022-05-23 DIAGNOSIS — Z860101 Personal history of adenomatous and serrated colon polyps: Secondary | ICD-10-CM

## 2022-05-23 DIAGNOSIS — Z86010 Personal history of colonic polyps: Secondary | ICD-10-CM

## 2022-05-23 LAB — CBC
HCT: 56.3 % — ABNORMAL HIGH (ref 39.0–52.0)
Hemoglobin: 19 g/dL (ref 13.0–17.0)
MCHC: 33.7 g/dL (ref 30.0–36.0)
MCV: 96.5 fl (ref 78.0–100.0)
Platelets: 186 10*3/uL (ref 150.0–400.0)
RBC: 5.83 Mil/uL — ABNORMAL HIGH (ref 4.22–5.81)
RDW: 14.9 % (ref 11.5–15.5)
WBC: 6.1 10*3/uL (ref 4.0–10.5)

## 2022-05-23 LAB — COMPREHENSIVE METABOLIC PANEL
ALT: 20 U/L (ref 0–53)
AST: 10 U/L (ref 0–37)
Albumin: 4.2 g/dL (ref 3.5–5.2)
Alkaline Phosphatase: 49 U/L (ref 39–117)
BUN: 16 mg/dL (ref 6–23)
CO2: 29 mEq/L (ref 19–32)
Calcium: 9.5 mg/dL (ref 8.4–10.5)
Chloride: 101 mEq/L (ref 96–112)
Creatinine, Ser: 1.17 mg/dL (ref 0.40–1.50)
GFR: 62.92 mL/min (ref >60.00)
Glucose, Bld: 105 mg/dL — ABNORMAL HIGH (ref 70–99)
Potassium: 5 mEq/L (ref 3.5–5.1)
Sodium: 138 mEq/L (ref 135–145)
Total Bilirubin: 1 mg/dL (ref 0.2–1.2)
Total Protein: 7 g/dL (ref 6.0–8.3)

## 2022-05-23 LAB — PSA: PSA: 0.15 ng/mL (ref 0.10–4.00)

## 2022-05-23 LAB — TSH: TSH: 2.68 u[IU]/mL (ref 0.35–5.50)

## 2022-05-23 MED ORDER — PROPRANOLOL HCL ER 80 MG PO CP24: 80.0000 mg | ORAL_CAPSULE | Freq: Every day | ORAL | 3 refills | Status: DC

## 2022-05-23 NOTE — Telephone Encounter (Signed)
[id number] with [place] lab called critical lab result on hgb 19. Sending note to Dr Silvio Pate who is out of office; Dr Glori Bickers who is in office, [place] and [employer]. Results entered in lab notebook. Sending teams to [employer] and [employer].

## 2022-05-23 NOTE — Progress Notes (Signed)
No egg or soy allergy known to patient  No issues known to pt with past sedation with any surgeries or procedures Patient denies ever being  intubated No issues with moving head neck or swallowing No FH of Malignant Hyperthermia Pt is not on diet pills Pt is not on  home 02  Pt is not on blood thinners  Pt denies issues with constipation  Pt is not on dialysis Pt denies any upcoming cardiac testing Pt encouraged to use to use Singlecare or Goodrx to reduce cost  Previsit completed and red dot placed by patient's name on their procedure day (on provider's schedule).  . Visit by phone Pt states weight is 180 lb Instructions reviewed with pt and pt states understanding. Instructed to review again prior to procedure. Pt states they will. Instructions sent to my chart and by mail

## 2022-05-23 NOTE — Telephone Encounter (Signed)
Can iron and ferritin levels be added to these labs for high Hb ?

## 2022-05-23 NOTE — Progress Notes (Signed)
Hearing Screening - Comments:: Past whisper test Vision Screening - Comments:: February 2024

## 2022-05-23 NOTE — Progress Notes (Signed)
Subjective:    Patient ID: Juan Holmes, male    DOB: Feb 05, 1951, 72 y.o.   MRN: [id number]  HPI Here for Medicare wellness visit and follow up of chronic health conditions Reviewed advanced directives Reviewed other doctors---Dr Maryellen Pile, Dr Rexene Alberts, Dr Dalia Heading, [facility], Dr Roel Cluck No hospitalizations or surgery in there past year Vision is fine Hearing is okay No alcohol at all No tobacco Still regular in the gym-----but spending time in church leadership that is taking his time No falls No depression or anhedonia Independent with instrumental ADLs No sig memory issues--just some recall  Brought in his BP monitor---didn't calibrate Uses the propranolol twice a day mostly for the tremor Some days doesn't take it at all  Has had easy bruising on forearms at times No ASA  Has had some skin biopsies--no cancer Did diagnose rosacea--gave doxy but he didn't take it  Uses the viagra intermittently Is effective but gets headache (only takes 1)  Past alcohol use---trouble with relationship issues None now  Current Outpatient Medications on File Prior to Visit  Medication Sig Dispense Refill   propranolol (INDERAL) 40 MG tablet TAKE 1 TABLET BY MOUTH 3 TIMES A DAY AS NEEDED 90 tablet 0   sildenafil (REVATIO) 20 MG tablet NEEDS OFFICE VISIT 50 tablet 0   No current facility-administered medications on file prior to visit.    Allergies  Allergen Reactions   Penicillins Hives    Past Medical History:  Diagnosis Date   Allergic rhinitis due to pollen    Angiodysplasia of cecum 04/03/2015   Benign essential tremor 03/10/1996   Colon polyps 03/10/2009   adenomatous   Congenital single kidney    ED (erectile dysfunction)    Prostate cancer ([place]) 03/10/2008   total prostatectomy    Past Surgical History:  Procedure Laterality Date   COLONOSCOPY     KNEE ARTHROSCOPY Right 2012   ROBOT ASSISTED LAPAROSCOPIC RADICAL PROSTATECTOMY N/A  2010   [facility] urology    VASECTOMY  1977    Family History  Problem Relation Age of Onset   Cancer Mother    Heart disease Mother    Stroke Father    Hyperlipidemia Brother    Hypertension Brother    Atrial fibrillation Brother    Hyperlipidemia Brother    Hypertension Brother    Diabetes Brother    Hyperlipidemia Brother    Hypertension Brother    Hyperlipidemia Brother    Hypertension Brother    Colon cancer Neg Hx    Rectal cancer Neg Hx    Stomach cancer Neg Hx    Colon polyps Neg Hx     Social History   Socioeconomic History   Marital status: Married    Spouse name: Not on file   Number of children: 2   Years of education: Not on file   Highest education level: Not on file  Occupational History   Occupation: Charity fundraiser    Comment: Retired   Occupation: Pension scheme manager    Comment: Retired  Tobacco Use   Smoking status: Former    Types: Cigarettes    Quit date: 02/02/2015    Years since quitting: 7.3   Smokeless tobacco: Never  Substance and Sexual Activity   Alcohol use: No    Alcohol/week: 0.0 standard drinks of alcohol   Drug use: No   Sexual activity: Not on file  Other Topics Concern   Not on file  Social History Narrative   Divorced long ago  Remarried in 2022      Has living will   wife Levada Dy should make decisions   Would accept resuscitation   Would accept tube feeds short term--- but not if cognitively unaware   Social Determinants of Health   Financial Resource Strain: Not on file  Food Insecurity: Not on file  Transportation Needs: Not on file  Physical Activity: Not on file  Stress: Not on file  Social Connections: Not on file  Intimate Partner Violence: Not on file   Review of Systems Appetite is good Weight is stable--wants to lose 10# Chronic sleep issues---will take a nap that helps Wears seat belt Lots of dental work--sees periodontist Had some hand issues--did see ortho but no action needed No chest  pain or SOB No dizziness or syncope No heartburn or dysphagia Voids fine Bowels are okay--no blood No sig back or joint pains    Objective:   Physical Exam Constitutional:      Appearance: Normal appearance.  HENT:     Mouth/Throat:     Pharynx: No oropharyngeal exudate or posterior oropharyngeal erythema.  Eyes:     Conjunctiva/sclera: Conjunctivae normal.     Pupils: Pupils are equal, round, and reactive to light.  Cardiovascular:     Rate and Rhythm: Normal rate and regular rhythm.     Pulses: Normal pulses.     Heart sounds:     No gallop.  Pulmonary:     Effort: Pulmonary effort is normal.     Breath sounds: Normal breath sounds. No wheezing or rales.  Abdominal:     Palpations: Abdomen is soft.     Tenderness: There is no abdominal tenderness.  Musculoskeletal:     Cervical back: Neck supple.     Right lower leg: No edema.     Left lower leg: No edema.  Lymphadenopathy:     Cervical: No cervical adenopathy.  Skin:    Findings: No lesion.     Comments: Very slight venous prominence in cheeks  Neurological:     General: No focal deficit present.     Mental Status: He is alert and oriented to person, place, and time.     Comments: Word naming 12/30 seconds Recall 3/3  Psychiatric:        Mood and Affect: Mood normal.        Behavior: Behavior normal.            Assessment & Plan:

## 2022-05-23 NOTE — Assessment & Plan Note (Signed)
I have personally reviewed the Medicare Annual Wellness questionnaire and have noted 1. The patient's medical and social history 2. Their use of alcohol, tobacco or illicit drugs 3. Their current medications and supplements 4. The patient's functional ability including ADL's, fall risks, home safety risks and hearing or visual             impairment. 5. Diet and physical activities 6. Evidence for depression or mood disorders  The patients weight, height, BMI and visual acuity have been recorded in the chart I have made referrals, counseling and provided education to the patient based review of the above and I have provided the pt with a written personalized care plan for preventive services.  I have provided you with a copy of your personalized plan for preventive services. Please take the time to review along with your updated medication list.  Healthy Exercises regularly Getting colon next month UTD on imms---discussed flu/RSV in the fall and probably the updated COVID soon

## 2022-05-23 NOTE — Telephone Encounter (Signed)
Looks like it was slightly elevated last year. 2 points higher this time. I will wait to see what Dr Glori Bickers has to say.

## 2022-05-23 NOTE — Assessment & Plan Note (Signed)
Very early Certainly doesn't need oral therapy---if worsens, can start topical metronidazole

## 2022-05-23 NOTE — Assessment & Plan Note (Signed)
Not drinking of late

## 2022-05-23 NOTE — Assessment & Plan Note (Signed)
Will check PSA 

## 2022-05-23 NOTE — Assessment & Plan Note (Signed)
Uses the propranolol prn--but often twice a day (40mg ) If uses daily, could consider changing to extended release

## 2022-05-26 NOTE — Addendum Note (Signed)
Addended by: Viviana Simpler I on: 05/26/2022 08:06 AM   Modules accepted: Orders

## 2022-05-26 NOTE — Telephone Encounter (Signed)
Pt has lab appt tomorrow  

## 2022-05-26 NOTE — Addendum Note (Signed)
Addended by: Viviana Simpler I on: 05/26/2022 08:04 AM   Modules accepted: Orders

## 2022-05-27 ENCOUNTER — Telehealth: Payer: Self-pay

## 2022-05-27 ENCOUNTER — Other Ambulatory Visit (INDEPENDENT_AMBULATORY_CARE_PROVIDER_SITE_OTHER): Payer: Medicare Other

## 2022-05-27 DIAGNOSIS — D751 Secondary polycythemia: Secondary | ICD-10-CM

## 2022-05-27 LAB — CBC
HCT: 56.5 % — ABNORMAL HIGH (ref 39.0–52.0)
Hemoglobin: 19.2 g/dL (ref 13.0–17.0)
MCHC: 33.9 g/dL (ref 30.0–36.0)
MCV: 96.7 fl (ref 78.0–100.0)
Platelets: 182 10*3/uL (ref 150.0–400.0)
RBC: 5.84 Mil/uL — ABNORMAL HIGH (ref 4.22–5.81)
RDW: 14.7 % (ref 11.5–15.5)
WBC: 6.7 10*3/uL (ref 4.0–10.5)

## 2022-05-27 LAB — IBC + FERRITIN
Ferritin: 178.3 ng/mL (ref 22.0–322.0)
Iron: 179 ug/dL — ABNORMAL HIGH (ref 42–165)
Saturation Ratios: 44.5 % (ref 20.0–50.0)
TIBC: 401.8 ug/dL (ref 250.0–450.0)
Transferrin: 287 mg/dL (ref 212.0–360.0)

## 2022-05-27 NOTE — Telephone Encounter (Addendum)
Santiago Glad with [facility] lab called critical lab for hgb 19.2 Dr Silvio Pate is out of office; please see lab result 05/23/22 for Dr Everardo Beals previous comments. Will send note to Dr Silvio Pate for review on 05/28/22 and also results of IBS + Ferritin and Erythropoietin which have not been resulted yet.. Will also send to Dr Glori Bickers who is in office this afternoon and [employer]. Also entered into lab notebook.

## 2022-05-29 LAB — ERYTHROPOIETIN: Erythropoietin: 5.4 m[IU]/mL (ref 2.6–18.5)

## 2022-05-30 ENCOUNTER — Encounter: Payer: Self-pay | Admitting: Internal Medicine

## 2022-05-30 ENCOUNTER — Inpatient Hospital Stay: Payer: Medicare Other | Attending: Internal Medicine | Admitting: Internal Medicine

## 2022-05-30 ENCOUNTER — Inpatient Hospital Stay: Payer: Medicare Other

## 2022-05-30 VITALS — BP 160/101 | HR 47 | Temp 97.6°F | Resp 16 | Ht 72.0 in | Wt 188.4 lb

## 2022-05-30 DIAGNOSIS — Z8546 Personal history of malignant neoplasm of prostate: Secondary | ICD-10-CM | POA: Diagnosis not present

## 2022-05-30 DIAGNOSIS — Z87891 Personal history of nicotine dependence: Secondary | ICD-10-CM | POA: Insufficient documentation

## 2022-05-30 DIAGNOSIS — D751 Secondary polycythemia: Secondary | ICD-10-CM | POA: Diagnosis not present

## 2022-05-30 DIAGNOSIS — Z9079 Acquired absence of other genital organ(s): Secondary | ICD-10-CM | POA: Insufficient documentation

## 2022-05-30 DIAGNOSIS — Z79899 Other long term (current) drug therapy: Secondary | ICD-10-CM | POA: Insufficient documentation

## 2022-05-30 NOTE — Progress Notes (Signed)
[place]  [phone] [phone]  ID: Lisbeth Ply OB: 1950-03-14  MR#: [id number]  [id number]  Patient Care Team: Venia Carbon, MD as PCP - General (Pediatrics)  REFERRING PROVIDER: Dr. Silvio Pate  REASON FOR REFERRAL: Polycythemia  HPI: Juan Holmes is a 72 y.o. male with past medical history of congenital single kidney, prostate cancer status post prostatectomy in 2010, ED on sildenafil was referred to hematology for workup of polycythemia.  Patient denies fever, chills, nausea, vomiting, shortness of breath, cough, abdominal pain, bleeding, bowel or bladder issues. Energy level is good.  Appetite is good.  Denies any weight loss. Denies pain.  Labs reviewed.  Patient was first found to have elevated hemoglobin in July 2023.  WBC and platelets are normal. From 08/12/21   Hb 17.1/50.7 From 05/23/22 hemoglobin 19/56.3 From 05/27/2022 Hb 19.2/56.5  EPO level is normal at 5.4. Patient is former smoker.  Quit in 2016. Testosterone inj started in March 2023. Went to [employer]. Stopped 3 weeks ago. March 1st. 0.25 mcg 3 times a week every week.    REVIEW OF SYSTEMS:   ROS  As per HPI. Otherwise, a complete review of systems is negative.  PAST MEDICAL HISTORY: Past Medical History:  Diagnosis Date   Allergic rhinitis due to pollen    Angiodysplasia of cecum 04/03/2015   Benign essential tremor 03/10/1996   Colon polyps 03/10/2009   adenomatous   ED (erectile dysfunction)    Prostate cancer ([place]) 03/10/2008   total prostatectomy   Renal agenesis     PAST SURGICAL HISTORY: Past Surgical History:  Procedure Laterality Date   COLONOSCOPY     KNEE ARTHROSCOPY Right 2012   ROBOT ASSISTED LAPAROSCOPIC RADICAL PROSTATECTOMY N/A 2010   [facility] urology    VASECTOMY  1977    FAMILY HISTORY: Family History  Problem Relation Age of Onset   Cancer Mother    Heart disease Mother    Lymphoma Mother    Stroke Father     Hyperlipidemia Brother    Hypertension Brother    Atrial fibrillation Brother    Hyperlipidemia Brother    Hypertension Brother    Diabetes Brother    Hyperlipidemia Brother    Hypertension Brother    Colon cancer Neg Hx    Rectal cancer Neg Hx    Stomach cancer Neg Hx    Colon polyps Neg Hx     HEALTH MAINTENANCE: Social History   Tobacco Use   Smoking status: Former    Packs/day: 1.00    Years: 30.00    Additional pack years: 0.00    Total pack years: 30.00    Types: Cigarettes    Quit date: 02/02/2015    Years since quitting: 7.3   Smokeless tobacco: Never  Vaping Use   Vaping Use: Never used  Substance Use Topics   Alcohol use: No    Alcohol/week: 0.0 standard drinks of alcohol   Drug use: No     Allergies  Allergen Reactions   Penicillins Hives    Current Outpatient Medications  Medication Sig Dispense Refill   propranolol ER (INDERAL LA) 80 MG 24 hr capsule Take 1 capsule (80 mg total) by mouth daily. 90 capsule 3   sildenafil (REVATIO) 20 MG tablet NEEDS OFFICE VISIT 50 tablet 0   No current facility-administered medications for this visit.    OBJECTIVE: Vitals:   05/30/22 1126  BP: (!) 160/101  Pulse: (!) 47  Resp: 16  Temp: 97.6 F (  36.4 C)     Body mass index is 25.55 kg/m.      General: Well-developed, well-nourished, no acute distress. Eyes: Pink conjunctiva, anicteric sclera. HEENT: Normocephalic, moist mucous membranes, clear oropharnyx. Lungs: Clear to auscultation bilaterally. Heart: Regular rate and rhythm. No rubs, murmurs, or gallops. Abdomen: Soft, nontender, nondistended. No organomegaly noted, normoactive bowel sounds. Musculoskeletal: No edema, cyanosis, or clubbing. Neuro: Alert, answering all questions appropriately. Cranial nerves grossly intact. Skin: No rashes or petechiae noted. Psych: Normal affect. Lymphatics: No cervical, calvicular, axillary or inguinal LAD.   LAB RESULTS:  Lab Results  Component Value Date    NA 138 05/23/2022   K 5.0 05/23/2022   CL 101 05/23/2022   CO2 29 05/23/2022   GLUCOSE 105 (H) 05/23/2022   BUN 16 05/23/2022   CREATININE 1.17 05/23/2022   CALCIUM 9.5 05/23/2022   PROT 7.0 05/23/2022   ALBUMIN 4.2 05/23/2022   AST 10 05/23/2022   ALT 20 05/23/2022   ALKPHOS 49 05/23/2022   BILITOT 1.0 05/23/2022    Lab Results  Component Value Date   WBC 6.7 05/27/2022   NEUTROABS 4.5 08/11/2014   HGB 19.2 Repeated and verified X2. (HH) 05/27/2022   HCT 56.5 (H) 05/27/2022   MCV 96.7 05/27/2022   PLT 182.0 05/27/2022    Lab Results  Component Value Date   TIBC 401.8 05/27/2022   FERRITIN 178.3 05/27/2022   IRONPCTSAT 44.5 05/27/2022     STUDIES: No results found.  ASSESSMENT AND PLAN:   Juan Holmes is a 72 y.o. male with pmh of congenital single kidney, prostate cancer status post prostatectomy in 2010, ED on sildenafil was referred to hematology for workup of polycythemia.  # Polycythemia -Of unknown etiology.  First noted in July 2023.  Most recent blood work from March 19 showed hemoglobin 19.2 and hematocrit 56.5.  WBC and platelets are normal. -EPO normal at 5.4. -Former smoker quit in 2016.  Patient has been using testosterone injections 0.25 mcg 3 times a week since March 2023.  He discontinued on May 09, 2022.  Polycythemia is likely secondary to androgen use.  I discussed about other potential etiologies such as PV and mutational testing.  Patient would like to hold off on further testing at this time.  I will repeat CBC in 3 weeks to recheck hemoglobin and hematocrit.  Would expect to trend down if related to androgen use.  No indication for phlebotomy since patient is asymptomatic.  Orders Placed This Encounter  Procedures   CBC with Differential/Platelet   RTC in 3 weeks for MD visit, labs  Patient expressed understanding and was in agreement with this plan. He also understands that He can call clinic at any time with any questions, concerns, or  complaints.   I spent a total of 45 minutes reviewing chart data, face-to-face evaluation with the patient, counseling and coordination of care as detailed above.  Jane Canary, MD   05/30/2022 12:11 PM

## 2022-06-17 ENCOUNTER — Encounter: Payer: Self-pay | Admitting: Certified Registered Nurse Anesthetist

## 2022-06-18 ENCOUNTER — Encounter: Payer: Self-pay | Admitting: Internal Medicine

## 2022-06-18 ENCOUNTER — Ambulatory Visit (AMBULATORY_SURGERY_CENTER): Payer: Medicare Other | Admitting: Internal Medicine

## 2022-06-18 VITALS — BP 142/73 | HR 49 | Temp 96.8°F | Resp 11 | Ht 73.0 in | Wt 180.0 lb

## 2022-06-18 DIAGNOSIS — Z86010 Personal history of colonic polyps: Secondary | ICD-10-CM

## 2022-06-18 DIAGNOSIS — K6389 Other specified diseases of intestine: Secondary | ICD-10-CM | POA: Diagnosis not present

## 2022-06-18 DIAGNOSIS — Z860101 Personal history of adenomatous and serrated colon polyps: Secondary | ICD-10-CM

## 2022-06-18 DIAGNOSIS — Z09 Encounter for follow-up examination after completed treatment for conditions other than malignant neoplasm: Secondary | ICD-10-CM | POA: Diagnosis not present

## 2022-06-18 DIAGNOSIS — D123 Benign neoplasm of transverse colon: Secondary | ICD-10-CM | POA: Diagnosis not present

## 2022-06-18 DIAGNOSIS — D125 Benign neoplasm of sigmoid colon: Secondary | ICD-10-CM

## 2022-06-18 MED ORDER — SODIUM CHLORIDE 0.9 % IV SOLN
500.0000 mL | INTRAVENOUS | Status: DC
Start: 2022-06-18 — End: 2022-06-18

## 2022-06-18 NOTE — Progress Notes (Signed)
[facility] History and Physical   Primary Care Physician:  Karie Schwalbe, MD   Reason for Procedure:   Hx colon polyps  Plan:    colonoscopy     HPI: Juan Holmes is a 72 y.o. male s/p removal adenomas 2011, none 2017 For surveillance   Past Medical History:  Diagnosis Date   Allergic rhinitis due to pollen    Angiodysplasia of cecum 04/03/2015   Benign essential tremor 03/10/1996   Colon polyps 03/10/2009   adenomatous   ED (erectile dysfunction)    Prostate cancer 03/10/2008   total prostatectomy   Renal agenesis     Past Surgical History:  Procedure Laterality Date   COLONOSCOPY     KNEE ARTHROSCOPY Right 2012   ROBOT ASSISTED LAPAROSCOPIC RADICAL PROSTATECTOMY N/A 2010   [facility] urology    VASECTOMY  1977    Prior to Admission medications   Medication Sig Start Date End Date Taking? Authorizing Provider  propranolol ER (INDERAL LA) 80 MG 24 hr capsule Take 1 capsule (80 mg total) by mouth daily. 05/23/22  Yes Karie Schwalbe, MD  sildenafil (REVATIO) 20 MG tablet NEEDS OFFICE VISIT 01/13/22   Karie Schwalbe, MD    Current Outpatient Medications  Medication Sig Dispense Refill   propranolol ER (INDERAL LA) 80 MG 24 hr capsule Take 1 capsule (80 mg total) by mouth daily. 90 capsule 3   sildenafil (REVATIO) 20 MG tablet NEEDS OFFICE VISIT 50 tablet 0   Current Facility-Administered Medications  Medication Dose Route Frequency Provider Last Rate Last Admin   0.9 %  sodium chloride infusion  500 mL Intravenous Continuous Iva Boop, MD        Allergies as of 06/18/2022 - Review Complete 06/18/2022  Allergen Reaction Noted   Penicillins Hives 08/31/2012    Family History  Problem Relation Age of Onset   Cancer Mother    Heart disease Mother    Lymphoma Mother    Stroke Father    Hyperlipidemia Brother    Hypertension Brother    Atrial fibrillation Brother    Hyperlipidemia Brother    Hypertension Brother    Diabetes  Brother    Hyperlipidemia Brother    Hypertension Brother    Colon cancer Neg Hx    Rectal cancer Neg Hx    Stomach cancer Neg Hx    Colon polyps Neg Hx     Social History   Socioeconomic History   Marital status: Married    Spouse name: Not on file   Number of children: 2   Years of education: Not on file   Highest education level: Not on file  Occupational History   Occupation: Nurse, adult    Comment: Retired   Occupation: Nurse, learning disability    Comment: Retired  Tobacco Use   Smoking status: Former    Packs/day: 1.00    Years: 30.00    Additional pack years: 0.00    Total pack years: 30.00    Types: Cigarettes    Quit date: 02/02/2015    Years since quitting: 7.3   Smokeless tobacco: Never  Vaping Use   Vaping Use: Never used  Substance and Sexual Activity   Alcohol use: No    Alcohol/week: 0.0 standard drinks of alcohol   Drug use: No   Sexual activity: Yes  Other Topics Concern   Not on file  Social History Narrative   Divorced long ago   Remarried in 2022  Has living will   wife Marylene Land should make decisions   Would accept resuscitation   Would accept tube feeds short term--- but not if cognitively unaware   Social Determinants of Health   Financial Resource Strain: Not on file  Food Insecurity: No Food Insecurity (05/30/2022)   Hunger Vital Sign    Worried About Running Out of Food in the Last Year: Never true    Ran Out of Food in the Last Year: Never true  Transportation Needs: No Transportation Needs (05/30/2022)   PRAPARE - Administrator, Civil Service (Medical): No    Lack of Transportation (Non-Medical): No  Physical Activity: Not on file  Stress: Not on file  Social Connections: Not on file  Intimate Partner Violence: Not At Risk (05/30/2022)   Humiliation, Afraid, Rape, and Kick questionnaire    Fear of Current or Ex-Partner: No    Emotionally Abused: No    Physically Abused: No    Sexually Abused: No    Review  of Systems:  All other review of systems negative except as mentioned in the HPI.  Physical Exam: Vital signs BP (!) 149/82   Pulse (!) 48   Temp (!) 96.8 F (36 C)   Ht 6\' 1"  (1.854 m)   Wt 180 lb (81.6 kg)   SpO2 96%   BMI 23.75 kg/m   General:   Alert,  Well-developed, well-nourished, pleasant and cooperative in NAD Lungs:  Clear throughout to auscultation.   Heart:  Regular rate and rhythm; no murmurs, clicks, rubs,  or gallops. Abdomen:  Soft, nontender and nondistended. Normal bowel sounds.   Neuro/Psych:  Alert and cooperative. Normal mood and affect. A and O x 3   @Gatsby Chismar  Sena Slate, MD, [facility] [phone] (pager) 06/18/2022 7:58 AM@

## 2022-06-18 NOTE — Progress Notes (Signed)
Pt's states no medical or surgical changes since previsit or office visit. 

## 2022-06-18 NOTE — Progress Notes (Signed)
Report given to PACU, vss 

## 2022-06-18 NOTE — Op Note (Signed)
[facility] Patient Name: Juan Holmes Procedure Date: 06/18/2022 7:51 AM MRN: [id number] Endoscopist: Iva Boop , MD, [id number] Age: 72 Referring MD:  Date of Birth: 28-Mar-1950 Gender: Male Account #: [id number] Procedure:                Colonoscopy Indications:              Surveillance: Personal history of adenomatous                            polyps on last colonoscopy > 5 years ago, Last                            colonoscopy: 2017 Medicines:                Monitored Anesthesia Care Procedure:                Pre-Anesthesia Assessment:                           - Prior to the procedure, a History and Physical                            was performed, and patient medications and                            allergies were reviewed. The patient's tolerance of                            previous anesthesia was also reviewed. The risks                            and benefits of the procedure and the sedation                            options and risks were discussed with the patient.                            All questions were answered, and informed consent                            was obtained. Prior Anticoagulants: The patient has                            taken no anticoagulant or antiplatelet agents. ASA                            Grade Assessment: II - A patient with mild systemic                            disease. After reviewing the risks and benefits,                            the patient was deemed in satisfactory condition to  undergo the procedure.                           After obtaining informed consent, the colonoscope                            was passed under direct vision. Throughout the                            procedure, the patient's blood pressure, pulse, and                            oxygen saturations were monitored continuously. The                            CF [id number] #[id number] was introduced through the anus                             and advanced to the the cecum, identified by                            appendiceal orifice and ileocecal valve. The                            colonoscopy was performed without difficulty. The                            patient tolerated the procedure well. The quality                            of the bowel preparation was good. The ileocecal                            valve, appendiceal orifice, and rectum were                            photographed. The bowel preparation used was                            Miralax via split dose instruction. Scope In: 8:06:14 AM Scope Out: 8:17:46 AM Scope Withdrawal Time: 0 hours 9 minutes 22 seconds  Total Procedure Duration: 0 hours 11 minutes 32 seconds  Findings:                 The digital rectal exam findings include surgically                            absent prostate.                           Two flat polyps were found in the sigmoid colon and                            transverse colon. The polyps were diminutive in  size. These polyps were removed with a cold snare.                            Resection and retrieval were complete. Verification                            of patient identification for the specimen was                            done. Estimated blood loss was minimal.                           Many diverticula were found in the entire colon.                           The exam was otherwise without abnormality on                            direct and retroflexion views. Complications:            No immediate complications. Estimated Blood Loss:     Estimated blood loss was minimal. Impression:               - A surgically absent prostate found on digital                            rectal exam.                           - Two diminutive polyps in the sigmoid colon and in                            the transverse colon, removed with a cold snare.                             Resected and retrieved.                           - Diverticulosis in the entire examined colon.                           - The examination was otherwise normal on direct                            and retroflexion views.                           - Personal history of colonic polyps. 2-3 adenomas                            2011, none in 2017 Recommendation:           - Patient has a contact number available for                            emergencies. The signs and  symptoms of potential                            delayed complications were discussed with the                            patient. Return to normal activities tomorrow.                            Written discharge instructions were provided to the                            patient.                           - Resume previous diet.                           - Continue present medications.                           - Await pathology results.                           - Repeat colonoscopy is recommended for                            surveillance. The colonoscopy date will be                            determined after pathology results from today's                            exam become available for review. Iva Boop, MD 06/18/2022 8:28:12 AM This report has been signed electronically.

## 2022-06-18 NOTE — Patient Instructions (Addendum)
- Resume previous diet. - Continue present medications. - Await pathology results. - Repeat colonoscopy is recommended for surveillance. The colonoscopy date will be determined after pathology results from today's exam become available for review.  I found and removed 2 tiny polyps. You also have a condition called diverticulosis - common and not usually a problem. Please read the handout provided.  I will let you know pathology results and when to have another routine colonoscopy by mail and/or My Chart.  I appreciate the opportunity to care for you. Iva Boop, MD, FACG  YOU HAD AN ENDOSCOPIC PROCEDURE TODAY AT THE [facility]:   Refer to the procedure report that was given to you for any specific questions about what was found during the examination.  If the procedure report does not answer your questions, please call your gastroenterologist to clarify.  If you requested that your care partner not be given the details of your procedure findings, then the procedure report has been included in a sealed envelope for you to review at your convenience later.  YOU SHOULD EXPECT: Some feelings of bloating in the abdomen. Passage of more gas than usual.  Walking can help get rid of the air that was put into your GI tract during the procedure and reduce the bloating. If you had a lower endoscopy (such as a colonoscopy or flexible sigmoidoscopy) you may notice spotting of blood in your stool or on the toilet paper. If you underwent a bowel prep for your procedure, you may not have a normal bowel movement for a few days.  Please Note:  You might notice some irritation and congestion in your nose or some drainage.  This is from the oxygen used during your procedure.  There is no need for concern and it should clear up in a day or so.  SYMPTOMS TO REPORT IMMEDIATELY:  Following lower endoscopy (colonoscopy or flexible sigmoidoscopy):  Excessive amounts of blood in the  stool  Significant tenderness or worsening of abdominal pains  Swelling of the abdomen that is new, acute  Fever of 100F or higher    For urgent or emergent issues, a gastroenterologist can be reached at any hour by calling [phone]. Do not use MyChart messaging for urgent concerns.    DIET:  We do recommend a small meal at first, but then you may proceed to your regular diet.  Drink plenty of fluids but you should avoid alcoholic beverages for 24 hours.  ACTIVITY:  You should plan to take it easy for the rest of today and you should NOT DRIVE or use heavy machinery until tomorrow (because of the sedation medicines used during the test).    FOLLOW UP: Our staff will call the number listed on your records the next business day following your procedure.  We will call around 7:15- 8:00 am to check on you and address any questions or concerns that you may have regarding the information given to you following your procedure. If we do not reach you, we will leave a message.     If any biopsies were taken you will be contacted by phone or by letter within the next 1-3 weeks.  Please call us at [phone] if you have not heard about the biopsies in 3 weeks.    SIGNATURES/CONFIDENTIALITY: You and/or your care partner have signed paperwork which will be entered into your electronic medical record.  These signatures attest to the fact that that the information above on your After  Visit Summary has been reviewed and is understood.  Full responsibility of the confidentiality of this discharge information lies with you and/or your care-partner. 

## 2022-06-18 NOTE — Progress Notes (Signed)
Called to room to assist during endoscopic procedure.  Patient ID and intended procedure confirmed with present staff. Received instructions for my participation in the procedure from the performing physician.  

## 2022-06-19 ENCOUNTER — Telehealth: Payer: Self-pay | Admitting: *Deleted

## 2022-06-19 NOTE — Telephone Encounter (Signed)
  Follow up Call-     06/18/2022    7:11 AM  Call back number  Post procedure Call Back phone  # [phone]  Permission to leave phone message Yes     Patient questions:  Do you have a fever, pain , or abdominal swelling? No. Pain Score  0 *  Have you tolerated food without any problems? Yes.    Have you been able to return to your normal activities? Yes.    Do you have any questions about your discharge instructions: Diet   No. Medications  No. Follow up visit  No.  Do you have questions or concerns about your Care? No.  Actions: * If pain score is 4 or above: No action needed, pain <4.

## 2022-06-20 ENCOUNTER — Encounter: Payer: Self-pay | Admitting: Internal Medicine

## 2022-06-20 ENCOUNTER — Inpatient Hospital Stay: Payer: Medicare Other | Admitting: Internal Medicine

## 2022-06-20 ENCOUNTER — Inpatient Hospital Stay: Payer: Medicare Other | Attending: Internal Medicine

## 2022-06-20 VITALS — BP 163/88 | HR 51 | Temp 97.1°F | Resp 15 | Wt 184.2 lb

## 2022-06-20 DIAGNOSIS — R001 Bradycardia, unspecified: Secondary | ICD-10-CM | POA: Insufficient documentation

## 2022-06-20 DIAGNOSIS — Z9079 Acquired absence of other genital organ(s): Secondary | ICD-10-CM | POA: Diagnosis not present

## 2022-06-20 DIAGNOSIS — D751 Secondary polycythemia: Secondary | ICD-10-CM | POA: Diagnosis not present

## 2022-06-20 DIAGNOSIS — Z79899 Other long term (current) drug therapy: Secondary | ICD-10-CM | POA: Diagnosis not present

## 2022-06-20 DIAGNOSIS — Z8546 Personal history of malignant neoplasm of prostate: Secondary | ICD-10-CM | POA: Insufficient documentation

## 2022-06-20 DIAGNOSIS — Z87891 Personal history of nicotine dependence: Secondary | ICD-10-CM | POA: Diagnosis not present

## 2022-06-20 LAB — CBC WITH DIFFERENTIAL/PLATELET
Abs Immature Granulocytes: 0.01 10*3/uL (ref 0.00–0.07)
Basophils Absolute: 0.1 10*3/uL (ref 0.0–0.1)
Basophils Relative: 2 %
Eosinophils Absolute: 0.3 10*3/uL (ref 0.0–0.5)
Eosinophils Relative: 5 %
HCT: 51.5 % (ref 39.0–52.0)
Hemoglobin: 17.3 g/dL — ABNORMAL HIGH (ref 13.0–17.0)
Immature Granulocytes: 0 %
Lymphocytes Relative: 28 %
Lymphs Abs: 1.5 10*3/uL (ref 0.7–4.0)
MCH: 31.5 pg (ref 26.0–34.0)
MCHC: 33.6 g/dL (ref 30.0–36.0)
MCV: 93.8 fL (ref 80.0–100.0)
Monocytes Absolute: 0.5 10*3/uL (ref 0.1–1.0)
Monocytes Relative: 8 %
Neutro Abs: 3.1 10*3/uL (ref 1.7–7.7)
Neutrophils Relative %: 57 %
Platelets: 171 10*3/uL (ref 150–400)
RBC: 5.49 MIL/uL (ref 4.22–5.81)
RDW: 12.9 % (ref 11.5–15.5)
WBC: 5.5 10*3/uL (ref 4.0–10.5)
nRBC: 0 % (ref 0.0–0.2)

## 2022-06-20 NOTE — Progress Notes (Signed)
[facility]  [phone] [phone]) [phone]  ID: Juan Holmes OB: [birth year]  MR#: [id number]  ORV#:[id number]  Patient Care Team: Karie Schwalbe, MD as PCP - General (Pediatrics)  HPI: Juan Holmes is a 72 y.o. male with past medical history of congenital single kidney, prostate cancer status post prostatectomy in 2010, ED on sildenafil was referred to hematology for workup of polycythemia.  Patient denies fever, chills, nausea, vomiting, shortness of breath, cough, abdominal pain, bleeding, bowel or bladder issues. Energy level is good.  Appetite is good.  Denies any weight loss. Denies pain.  Labs reviewed.  Patient was first found to have elevated hemoglobin in July 2023.  WBC and platelets are normal. From 08/12/21   Hb 17.1/50.7 From 05/23/22 hemoglobin 19/56.3 From 05/27/2022 Hb 19.2/56.5  EPO level is normal at 5.4. Patient is former smoker.  Quit in 2016. Testosterone inj started in March 2023. Went to [employer]. Stopped 3 weeks ago. March 1st. 0.25 mcg 3 times a week every week.   Interval history Patient was seen today for follow-up labs. He has been off testosterone for 6 weeks now.  Had colonoscopy done couple of days ago which showed few polyps.  He has bradycardia with heart rate in high 40s to 50s.  He is on propranolol for benign tremor.  He denies any dizziness or shortness of breath or chest pain.  Was inquiring if he can cut down his propranolol.  He has not been taking his every day.  REVIEW OF SYSTEMS:   ROS  As per HPI. Otherwise, a complete review of systems is negative.  PAST MEDICAL HISTORY: Past Medical History:  Diagnosis Date   Allergic rhinitis due to pollen    Angiodysplasia of cecum 04/03/2015   Benign essential tremor 03/10/1996   Colon polyps 03/10/2009   adenomatous   ED (erectile dysfunction)    Prostate cancer 03/10/2008   total prostatectomy   Renal agenesis     PAST SURGICAL HISTORY: Past  Surgical History:  Procedure Laterality Date   COLONOSCOPY     KNEE ARTHROSCOPY Right 2012   ROBOT ASSISTED LAPAROSCOPIC RADICAL PROSTATECTOMY N/A 2010   [facility]    VASECTOMY  1977    FAMILY HISTORY: Family History  Problem Relation Age of Onset   Cancer Mother    Heart disease Mother    Lymphoma Mother    Stroke Father    Hyperlipidemia Brother    Hypertension Brother    Atrial fibrillation Brother    Hyperlipidemia Brother    Hypertension Brother    Diabetes Brother    Hyperlipidemia Brother    Hypertension Brother    Colon cancer Neg Hx    Rectal cancer Neg Hx    Stomach cancer Neg Hx    Colon polyps Neg Hx     HEALTH MAINTENANCE: Social History   Tobacco Use   Smoking status: Former    Packs/day: 1.00    Years: 30.00    Additional pack years: 0.00    Total pack years: 30.00    Types: Cigarettes    Quit date: 02/02/2015    Years since quitting: 7.3   Smokeless tobacco: Never  Vaping Use   Vaping Use: Never used  Substance Use Topics   Alcohol use: No    Alcohol/week: 0.0 standard drinks of alcohol   Drug use: No     Allergies  Allergen Reactions   Penicillins Hives    Current Outpatient Medications  Medication Sig Dispense  Refill   propranolol ER (INDERAL LA) 80 MG 24 hr capsule Take 1 capsule (80 mg total) by mouth daily. 90 capsule 3   sildenafil (REVATIO) 20 MG tablet NEEDS OFFICE VISIT 50 tablet 0   No current facility-administered medications for this visit.    OBJECTIVE: Vitals:   06/20/22 1039  BP: (!) 163/88  Pulse: (!) 51  Resp: 15  Temp: (!) 97.1 F (36.2 C)  SpO2: 98%     Body mass index is 24.3 kg/m.      Physical exam not performed.  No specific complaints endorsed.   LAB RESULTS:  Lab Results  Component Value Date   NA 138 05/23/2022   K 5.0 05/23/2022   CL 101 05/23/2022   CO2 29 05/23/2022   GLUCOSE 105 (H) 05/23/2022   BUN 16 05/23/2022   CREATININE 1.17 05/23/2022   CALCIUM 9.5 05/23/2022    PROT 7.0 05/23/2022   ALBUMIN 4.2 05/23/2022   AST 10 05/23/2022   ALT 20 05/23/2022   ALKPHOS 49 05/23/2022   BILITOT 1.0 05/23/2022    Lab Results  Component Value Date   WBC 5.5 06/20/2022   NEUTROABS 3.1 06/20/2022   HGB 17.3 (H) 06/20/2022   HCT 51.5 06/20/2022   MCV 93.8 06/20/2022   PLT 171 06/20/2022    Lab Results  Component Value Date   TIBC 401.8 05/27/2022   FERRITIN 178.3 05/27/2022   IRONPCTSAT 44.5 05/27/2022     STUDIES: No results found.  ASSESSMENT AND PLAN:   Juan Holmes is a 72 y.o. male with pmh of congenital single kidney, prostate cancer status post prostatectomy in 2010, ED on sildenafil was referred to hematology for workup of polycythemia.  # Polycythemia -Likely secondary to testosterone use.  He stopped taking testosterone around March 1.  Repeat CBC today showed decrease in the hemoglobin level from 19.3-17.3.  EPO was normal.  WBC and platelet counts are normal.   -Patient does not plan to go back on testosterone.  He can continue to follow-up with his PCP for routine lab work.  No further workup needed from polycythemia standpoint.  # Benign essential tremor # Medication induced bradycardia -He is on propranolol 80 mg daily for tremors but does not take it daily.  Heart rate has been ranging high 40s to 50s.  He is asymptomatic from bradycardia.   -Advised to cut down to 2 pills and can monitor his heart rate.  If it is still low to discuss further with Dr. Alphonsus Sias.  RTC as needed  Patient expressed understanding and was in agreement with this plan. He also understands that He can call clinic at any time with any questions, concerns, or complaints.   I spent a total of 25 minutes reviewing chart data, face-to-face evaluation with the patient, counseling and coordination of care as detailed above.  Michaelyn Barter, MD   06/20/2022 12:41 PM

## 2022-07-02 ENCOUNTER — Encounter: Payer: Self-pay | Admitting: Internal Medicine

## 2022-09-22 ENCOUNTER — Other Ambulatory Visit: Payer: Self-pay | Admitting: Internal Medicine

## 2022-09-24 DIAGNOSIS — H52223 Regular astigmatism, bilateral: Secondary | ICD-10-CM | POA: Diagnosis not present

## 2022-09-24 DIAGNOSIS — H5203 Hypermetropia, bilateral: Secondary | ICD-10-CM | POA: Diagnosis not present

## 2022-09-24 DIAGNOSIS — H0288A Meibomian gland dysfunction right eye, upper and lower eyelids: Secondary | ICD-10-CM | POA: Diagnosis not present

## 2022-09-24 DIAGNOSIS — H524 Presbyopia: Secondary | ICD-10-CM | POA: Diagnosis not present

## 2022-09-24 DIAGNOSIS — H2513 Age-related nuclear cataract, bilateral: Secondary | ICD-10-CM | POA: Diagnosis not present

## 2022-09-24 DIAGNOSIS — H0288B Meibomian gland dysfunction left eye, upper and lower eyelids: Secondary | ICD-10-CM | POA: Diagnosis not present

## 2022-12-04 ENCOUNTER — Encounter: Payer: Self-pay | Admitting: Dermatology

## 2022-12-04 ENCOUNTER — Ambulatory Visit: Payer: Medicare Other | Admitting: Dermatology

## 2022-12-04 VITALS — BP 147/86 | HR 51

## 2022-12-04 DIAGNOSIS — Z79899 Other long term (current) drug therapy: Secondary | ICD-10-CM | POA: Diagnosis not present

## 2022-12-04 DIAGNOSIS — L719 Rosacea, unspecified: Secondary | ICD-10-CM

## 2022-12-04 DIAGNOSIS — L578 Other skin changes due to chronic exposure to nonionizing radiation: Secondary | ICD-10-CM

## 2022-12-04 DIAGNOSIS — W908XXA Exposure to other nonionizing radiation, initial encounter: Secondary | ICD-10-CM

## 2022-12-04 DIAGNOSIS — L814 Other melanin hyperpigmentation: Secondary | ICD-10-CM | POA: Diagnosis not present

## 2022-12-04 DIAGNOSIS — Z7189 Other specified counseling: Secondary | ICD-10-CM

## 2022-12-04 DIAGNOSIS — L820 Inflamed seborrheic keratosis: Secondary | ICD-10-CM

## 2022-12-04 NOTE — Progress Notes (Signed)
Follow-Up Visit   Subjective  Juan Holmes is a 72 y.o. male who presents for the following:  6 months hx of rosacea, recheck bx at left inf scapula  Patient also reports noticed some discoloration in groin area several months ago.   The patient has spots, moles and lesions to be evaluated, some may be new or changing and the patient may have concern these could be cancer.   The following portions of the chart were reviewed this encounter and updated as appropriate: medications, allergies, medical history  Review of Systems:  No other skin or systemic complaints except as noted in HPI or Assessment and Plan.  Objective  Well appearing patient in no apparent distress; mood and affect are within normal limits.   A focused examination was performed of the following areas: Face, groin, left infra scapula   Relevant exam findings are noted in the Assessment and Plan.  left suprapubic area x 1 Erythematous stuck-on, waxy papule or plaque    Assessment & Plan   ROSACEA  & ocular rosacea  (Ocular signs and symptoms now resolved after course of oral doxycycline) Exam Mid face erythema with bumps and telangiectasias  Chronic and persistent condition with duration or expected duration over one year. Condition is improving with treatment but not currently at goal. Rosacea is a chronic progressive skin condition usually affecting the face of adults, causing redness and/or acne bumps. It is treatable but not curable. It sometimes affects the eyes (ocular rosacea) as well. It may respond to topical and/or systemic medication and can flare with stress, sun exposure, alcohol, exercise, topical steroids (including hydrocortisone/cortisone 10) and some foods.  Daily application of broad spectrum spf 30+ sunscreen to face is recommended to reduce flares. Counseling for BBL / IPL / Laser and Coordination of Care Discussed the treatment option of Broad Band Light (BBL) /Intense Pulsed Light (IPL)/  Laser for skin discoloration, including brown spots and redness.  Typically we recommend at least 1-3 treatment sessions about 5-8 weeks apart for best results.  Cannot have tanned skin when BBL performed, and regular use of sunscreen is advised after the procedure to help maintain results. The patient's condition may also require "maintenance treatments" in the future.  The fee for BBL / laser treatments is $350 per treatment session for the whole face.  A fee can be quoted for other parts of the body.  Insurance typically does not pay for BBL/laser treatments and therefore the fee is an out-of-pocket cost.  Patient denies grittiness of the eyes.  Pt advised that signs and symptoms of ocular rosacea may recur in future and he may benefit from re-starting oral doxycycline if it does recur.  Treatment Plan   Recommend start   Will prescribe [facility] metronidazole/ivermectin/azelaic acid  apply qhs as needed to affected areas on the face. The patient was advised this is not covered by insurance since it is made by a compounding pharmacy. They will receive an email to check out and the medication will be mailed to their home.   Instructions for [facility] Medications  One or more of your medications was sent to the [facility] mail order compounding pharmacy. You will receive an email from them and can purchase the medicine through that link. It will then be mailed to your home at the address you confirmed. If for any reason you do not receive an email from them, please check your spam folder. If you still do not find the email, please  let us know. [facility] phone number is [phone].   LENTIGINES Exam: scattered tan macules Due to sun exposure Treatment Plan: Benign-appearing, observe. Recommend daily broad spectrum sunscreen SPF 30+ to sun-exposed areas, reapply every 2 hours as needed.  Call for any changes  ACTINIC DAMAGE - chronic, secondary to cumulative UV radiation  exposure/sun exposure over time - diffuse scaly erythematous macules with underlying dyspigmentation - Recommend daily broad spectrum sunscreen SPF 30+ to sun-exposed areas, reapply every 2 hours as needed.  - Recommend staying in the shade or wearing long sleeves, sun glasses (UVA+UVB protection) and wide brim hats (4-inch brim around the entire circumference of the hat). - Call for new or changing lesions.  Inflamed seborrheic keratosis left suprapubic area x 1  Symptomatic, irritating, patient would like treated.  Advised to call in 2 months if not clear   Destruction of lesion - left suprapubic area x 1 Complexity: simple   Destruction method: cryotherapy   Informed consent: discussed and consent obtained   Timeout:  patient name, date of birth, surgical site, and procedure verified Lesion destroyed using liquid nitrogen: Yes   Region frozen until ice ball extended beyond lesion: Yes   Outcome: patient tolerated procedure well with no complications   Post-procedure details: wound care instructions given    Actinic skin damage  Lentigo  Counseling and coordination of care  Medication management  Rosacea    Return in about 1 year (around 12/04/2023) for rosacea.  IAsher Muir, CMA, am acting as scribe for Armida Sans, MD.   Documentation: I have reviewed the above documentation for accuracy and completeness, and I agree with the above.  Armida Sans, MD

## 2022-12-04 NOTE — Patient Instructions (Addendum)
Seborrheic Keratosis  What causes seborrheic keratoses? Seborrheic keratoses are harmless, common skin growths that first appear during adult life.  As time goes by, more growths appear.  Some people may develop a large number of them.  Seborrheic keratoses appear on both covered and uncovered body parts.  They are not caused by sunlight.  The tendency to develop seborrheic keratoses can be inherited.  They vary in color from skin-colored to gray, brown, or even black.  They can be either smooth or have a rough, warty surface.   Seborrheic keratoses are superficial and look as if they were stuck on the skin.  Under the microscope this type of keratosis looks like layers upon layers of skin.  That is why at times the top layer may seem to fall off, but the rest of the growth remains and re-grows.    Treatment Seborrheic keratoses do not need to be treated, but can easily be removed in the office.  Seborrheic keratoses often cause symptoms when they rub on clothing or jewelry.  Lesions can be in the way of shaving.  If they become inflamed, they can cause itching, soreness, or burning.  Removal of a seborrheic keratosis can be accomplished by freezing, burning, or surgery. If any spot bleeds, scabs, or grows rapidly, please return to have it checked, as these can be an indication of a skin cancer.   Cryotherapy Aftercare  Wash gently with soap and water everyday.   Apply Vaseline and Band-Aid daily until healed.      Rosacea  What is rosacea? Rosacea (say: ro-zay-sha) is a common skin disease that usually begins as a trend of flushing or blushing easily.  As rosacea progresses, a persistent redness in the center of the face will develop and may gradually spread beyond the nose and cheeks to the forehead and chin.  In some cases, the ears, chest, and back could be affected.  Rosacea may appear as tiny blood vessels or small red bumps that occur in crops.  Frequently they can contain pus, and are  called "pustules".  If the bumps do not contain pus, they are referred to as "papules".  Rarely, in prolonged, untreated cases of rosacea, the oil glands of the nose and cheeks may become permanently enlarged.  This is called rhinophyma, and is seen more frequently in men.  Signs and Risks In its beginning stages, rosacea tends to come and go, which makes it difficult to recognize.  It can start as intermittent flushing of the face.  Eventually, blood vessels may become permanently visible.  Pustules and papules can appear, but can be mistaken for adult acne.  People of all races, ages, genders and ethnic groups are at risk of developing rosacea.  However, it is more common in women (especially around menopause) and adults with fair skin between the ages of 60 and 31.  Treatment Dermatologists typically recommend a combination of treatments to effectively manage rosacea.  Treatment can improve symptoms and may stop the progression of the rosacea.  Treatment may involve both topical and oral medications.  The tetracycline antibiotics are often used for their anti-inflammatory effect; however, because of the possibility of developing antibiotic resistance, they should not be used long term at full dose.  For dilated blood vessels the options include electrodessication (uses electric current through a small needle), laser treatment, and cosmetics to hide the redness.   With all forms of treatment, improvement is a slow process, and patients may not see any results for  the first 3-4 weeks.  It is very important to avoid the sun and other triggers.  Patients must wear sunscreen daily.  Skin Care Instructions: Cleanse the skin with a mild soap such as CeraVe cleanser, Cetaphil cleanser, or Dove soap once or twice daily as needed. Moisturize with Eucerin Redness Relief Daily Perfecting Lotion (has a subtle green tint), CeraVe Moisturizing Cream, or Oil of Olay Daily Moisturizer with sunscreen every morning  and/or night as recommended. Makeup should be "non-comedogenic" (won't clog pores) and be labeled "for sensitive skin". Good choices for cosmetics are: Neutrogena, Almay, and Physician's Formula.  Any product with a green tint tends to offset a red complexion. If your eyes are dry and irritated, use artificial tears 2-3 times per day and cleanse the eyelids daily with baby shampoo.  Have your eyes examined at least every 2 years.  Be sure to tell your eye doctor that you have rosacea. Alcoholic beverages tend to cause flushing of the skin, and may make rosacea worse. Always wear sunscreen, protect your skin from extreme hot and cold temperatures, and avoid spicy foods, hot drinks, and mechanical irritation such as rubbing, scrubbing, or massaging the face.  Avoid harsh skin cleansers, cleansing masks, astringents, and exfoliation. If a particular product burns or makes your face feel tight, then it is likely to flare your rosacea. If you are having difficulty finding a sunscreen that you can tolerate, you may try switching to a chemical-free sunscreen.  These are ones whose active ingredient is zinc oxide or titanium dioxide only.  They should also be fragrance free, non-comedogenic, and labeled for sensitive skin. Rosacea triggers may vary from person to person.  There are a variety of foods that have been reported to trigger rosacea.  Some patients find that keeping a diary of what they were doing when they flared helps them avoid triggers.    Start  [facility] metronidazole/ivermectin/azelaic acid nightly   as needed to affected areas on the cheeks and nose. The patient was advised this is not covered by insurance since it is made by a compounding pharmacy. They will receive an email to check out and the medication will be mailed to their home.    Instructions for [facility] Medications  One or more of your medications was sent to the [facility] mail order compounding pharmacy. You  will receive an email from them and can purchase the medicine through that link. It will then be mailed to your home at the address you confirmed. If for any reason you do not receive an email from them, please check your spam folder. If you still do not find the email, please let us know. [facility] phone number is [phone].    Due to recent changes in healthcare laws, you may see results of your pathology and/or laboratory studies on MyChart before the doctors have had a chance to review them. We understand that in some cases there may be results that are confusing or concerning to you. Please understand that not all results are received at the same time and often the doctors may need to interpret multiple results in order to provide you with the best plan of care or course of treatment. Therefore, we ask that you please give [place] 2 business days to thoroughly review all your results before contacting the office for clarification. Should we see a critical lab result, you will be contacted sooner.   If You Need Anything After Your Visit  If you have  any questions or concerns for your doctor, please call our main line at [phone] and press option 4 to reach your doctor's medical assistant. If no one answers, please leave a voicemail as directed and we will return your call as soon as possible. Messages left after 4 pm will be answered the following business day.   You may also send [place] a message via MyChart. We typically respond to MyChart messages within 1-2 business days.  For prescription refills, please ask your pharmacy to contact our office. Our fax number is [phone].  If you have an urgent issue when the clinic is closed that cannot wait until the next business day, you can page your doctor at the number below.    Please note that while we do our best to be available for urgent issues outside of office hours, we are not available 24/7.   If you have an urgent issue and are  unable to reach [place], you may choose to seek medical care at your doctor's office, retail clinic, urgent care center, or emergency room.  If you have a medical emergency, please immediately call 911 or go to the emergency department.  Pager Numbers  - Dr. Gwen Pounds: [phone]  - Dr. Roseanne Reno: [phone]  - Dr. Katrinka Blazing: [phone]   In the event of inclement weather, please call our main line at [phone] for an update on the status of any delays or closures.  Dermatology Medication Tips: Please keep the boxes that topical medications come in in order to help keep track of the instructions about where and how to use these. Pharmacies typically print the medication instructions only on the boxes and not directly on the medication tubes.   If your medication is too expensive, please contact our office at [phone] option 4 or send [place] a message through MyChart.   We are unable to tell what your co-pay for medications will be in advance as this is different depending on your insurance coverage. However, we may be able to find a substitute medication at lower cost or fill out paperwork to get insurance to cover a needed medication.   If a prior authorization is required to get your medication covered by your insurance company, please allow [place] 1-2 business days to complete this process.  Drug prices often vary depending on where the prescription is filled and some pharmacies may offer cheaper prices.  The website www.goodrx.com contains coupons for medications through different pharmacies. The prices here do not account for what the cost may be with help from insurance (it may be cheaper with your insurance), but the website can give you the price if you did not use any insurance.  - You can print the associated coupon and take it with your prescription to the pharmacy.  - You may also stop by our office during regular business hours and pick up a GoodRx coupon card.  - If you need your  prescription sent electronically to a different pharmacy, notify our office through [facility] or by phone at [phone] option 4.     Si Usted Necesita Algo Despus de Su Visita  Tambin puede enviarnos un mensaje a travs de Clinical cytogeneticist. Por lo general respondemos a los mensajes de MyChart en el transcurso de 1 a 2 das hbiles.  Para renovar recetas, por favor pida a su farmacia que se ponga en contacto con nuestra oficina. Annie Sable de fax es [place] [phone].  Si tiene un asunto urgente cuando la clnica est cerrada y  que no puede esperar hasta el siguiente da hbil, puede llamar/localizar a su doctor(a) al nmero que aparece a continuacin.   Por favor, tenga en cuenta que aunque hacemos todo lo posible para estar disponibles para asuntos urgentes fuera del horario de [place], no estamos disponibles las 24 horas del da, los 7 [address]  [address] de la [place].   Si tiene un problema urgente y no puede comunicarse con nosotros, puede optar por buscar atencin mdica  en el consultorio de su doctor(a), en una clnica privada, en un centro de atencin urgente o en una sala de emergencias.  Si tiene Engineer, drilling, por favor llame inmediatamente al 911 o vaya a la sala de emergencias.  Nmeros de bper  - Dr. Gwen Pounds: [phone]  - Dra. Roseanne Reno: [phone]  - Dr. Katrinka Blazing: [phone]   En caso de inclemencias del tiempo, por favor llame a Lacy Duverney principal al [phone] para una actualizacin sobre el [place] de cualquier retraso o cierre.  Consejos para la medicacin en dermatologa: Por favor, guarde las cajas en las que vienen los medicamentos de uso tpico para ayudarle a seguir las instrucciones sobre dnde y cmo usarlos. Las farmacias generalmente imprimen las instrucciones del medicamento slo en las cajas y no directamente en los tubos del [place].   Si su medicamento es muy caro, por favor, pngase en contacto con Rolm Gala llamando al  [phone] y presione la opcin 4 o envenos un mensaje a travs de Clinical cytogeneticist.   No podemos decirle cul ser su copago por los medicamentos por adelantado ya que esto es diferente dependiendo de la cobertura de su seguro. Sin embargo, es posible que podamos encontrar un medicamento sustituto a Audiological scientist un formulario para que el seguro cubra el medicamento que se considera necesario.   Si se requiere una autorizacin previa para que su compaa de seguros [place] su medicamento, por favor permtanos de 1 a 2 das hbiles para completar [address]  Los precios de los medicamentos varan con frecuencia dependiendo del Environmental consultant de dnde se surte la receta y alguna farmacias pueden ofrecer precios ms baratos.  El sitio web www.goodrx.com tiene cupones para medicamentos de Health and safety inspector. Los precios aqu no tienen en cuenta lo que podra costar con la ayuda del seguro (puede ser ms barato con su seguro), pero el sitio web puede darle el precio si no utiliz Tourist information centre manager.  - Puede imprimir el cupn correspondiente y llevarlo con su receta a la farmacia.  - Tambin puede pasar por nuestra oficina durante el horario de atencin regular y Education officer, museum una tarjeta de cupones de GoodRx.  - Si necesita que su receta se enve electrnicamente a una farmacia diferente, informe a nuestra oficina a travs de MyChart de [place] o por telfono llamando al [phone] y presione la opcin 4.

## 2023-05-28 ENCOUNTER — Encounter: Payer: Self-pay | Admitting: Internal Medicine

## 2023-06-01 ENCOUNTER — Encounter: Payer: Self-pay | Admitting: Internal Medicine

## 2023-06-01 ENCOUNTER — Ambulatory Visit (INDEPENDENT_AMBULATORY_CARE_PROVIDER_SITE_OTHER): Admitting: Internal Medicine

## 2023-06-01 VITALS — BP 132/84 | HR 47 | Temp 98.6°F | Ht 71.5 in | Wt 187.0 lb

## 2023-06-01 DIAGNOSIS — G250 Essential tremor: Secondary | ICD-10-CM

## 2023-06-01 DIAGNOSIS — Z1159 Encounter for screening for other viral diseases: Secondary | ICD-10-CM

## 2023-06-01 DIAGNOSIS — E785 Hyperlipidemia, unspecified: Secondary | ICD-10-CM

## 2023-06-01 DIAGNOSIS — Z8546 Personal history of malignant neoplasm of prostate: Secondary | ICD-10-CM | POA: Diagnosis not present

## 2023-06-01 DIAGNOSIS — I10 Essential (primary) hypertension: Secondary | ICD-10-CM

## 2023-06-01 DIAGNOSIS — Z0000 Encounter for general adult medical examination without abnormal findings: Secondary | ICD-10-CM

## 2023-06-01 DIAGNOSIS — F1021 Alcohol dependence, in remission: Secondary | ICD-10-CM

## 2023-06-01 LAB — CBC
HCT: 45.8 % (ref 39.0–52.0)
Hemoglobin: 15.6 g/dL (ref 13.0–17.0)
MCHC: 34.1 g/dL (ref 30.0–36.0)
MCV: 94.2 fl (ref 78.0–100.0)
Platelets: 206 10*3/uL (ref 150.0–400.0)
RBC: 4.86 Mil/uL (ref 4.22–5.81)
RDW: 13.7 % (ref 11.5–15.5)
WBC: 7.4 10*3/uL (ref 4.0–10.5)

## 2023-06-01 LAB — LIPID PANEL
Cholesterol: 205 mg/dL — ABNORMAL HIGH (ref 0–200)
HDL: 61.7 mg/dL (ref >39.00)
LDL Cholesterol: 126 mg/dL — ABNORMAL HIGH (ref 0–99)
NonHDL: 143.55
Total CHOL/HDL Ratio: 3
Triglycerides: 87 mg/dL (ref 0.0–149.0)
VLDL: 17.4 mg/dL (ref 0.0–40.0)

## 2023-06-01 LAB — COMPREHENSIVE METABOLIC PANEL
ALT: 23 U/L (ref 0–53)
AST: 9 U/L (ref 0–37)
Albumin: 4.5 g/dL (ref 3.5–5.2)
Alkaline Phosphatase: 53 U/L (ref 39–117)
BUN: 15 mg/dL (ref 6–23)
CO2: 27 meq/L (ref 19–32)
Calcium: 9.4 mg/dL (ref 8.4–10.5)
Chloride: 103 meq/L (ref 96–112)
Creatinine, Ser: 1.03 mg/dL (ref 0.40–1.50)
GFR: 72.8 mL/min (ref >60.00)
Glucose, Bld: 113 mg/dL — ABNORMAL HIGH (ref 70–99)
Potassium: 4.7 meq/L (ref 3.5–5.1)
Sodium: 138 meq/L (ref 135–145)
Total Bilirubin: 0.6 mg/dL (ref 0.2–1.2)
Total Protein: 6.8 g/dL (ref 6.0–8.3)

## 2023-06-01 LAB — PSA: PSA: 0.19 ng/mL (ref 0.10–4.00)

## 2023-06-01 NOTE — Assessment & Plan Note (Signed)
 Seems milder now Hasn't been taking the propranolol lately --discussed that it is long acting and not prn

## 2023-06-01 NOTE — Assessment & Plan Note (Signed)
 I have personally reviewed the Medicare Annual Wellness questionnaire and have noted 1. The patient's medical and social history 2. Their use of alcohol, tobacco or illicit drugs 3. Their current medications and supplements 4. The patient's functional ability including ADL's, fall risks, home safety risks and hearing or visual             impairment. 5. Diet and physical activities 6. Evidence for depression or mood disorders  The patients weight, height, BMI and visual acuity have been recorded in the chart I have made referrals, counseling and provided education to the patient based review of the above and I have provided the pt with a written personalized care plan for preventive services.  I have provided you with a copy of your personalized plan for preventive services. Please take the time to review along with your updated medication list.  Last screening colon in 4 years Will recheck PSA Exercises regularly Flu/COVID vaccines in the fall RSV in the next couple of years

## 2023-06-01 NOTE — Assessment & Plan Note (Signed)
 Past problems and AA attendance No problems with occasional beer now

## 2023-06-01 NOTE — Progress Notes (Signed)
Hearing Screening - Comments:: Passed whisper test  Vision Screening - Comments:: June 2024

## 2023-06-01 NOTE — Progress Notes (Signed)
 Subjective:    Patient ID: Juan Holmes, male    DOB: 04-11-1950, 73 y.o.   MRN: [id number]  HPI Here for Medicare wellness visit and follow up of chronic health conditions Reviewed advanced directives Reviewed other doctors---Dr Sylvie Farrier, Dr Collier Salina, Dr Agrawal--hematology, LTR dental, Dr Kyung Bacca No hospitalizations or surgery in the past year Continues to exercise regularly Vision is fine Hearing is good Occasional beer--this doesn't cause problems with his past alcohol problems No tobacco No falls No depression or anhedonia Independent with instrumental ADLs No sig memory issues  No chest pain or SOB Some allergy issues---but prefers no medications (sedation) No dizziness or syncope No edema No palpitations  Tremor is controlled for the most part---doesn't use the propranolol every day  Was using testosterone last year From clinic in [place]---trouble with energy/felt he couldn't get the resistance benefit  Current Outpatient Medications on File Prior to Visit  Medication Sig Dispense Refill   propranolol ER (INDERAL LA) 80 MG 24 hr capsule Take 1 capsule (80 mg total) by mouth daily. 90 capsule 3   sildenafil (REVATIO) 20 MG tablet TAKE THREE TO FIVE TABLETS BY MOUTH DAILY AS NEEDED (NEED OFFICE VISIT FOR REFILLS) 50 tablet 11   No current facility-administered medications on file prior to visit.    Allergies  Allergen Reactions   Penicillins Hives    Past Medical History:  Diagnosis Date   Allergic rhinitis due to pollen    Angiodysplasia of cecum 04/03/2015   Benign essential tremor 03/10/1996   Colon polyps 03/10/2009   adenomatous   ED (erectile dysfunction)    Prostate cancer (HCC) 03/10/2008   total prostatectomy   Renal agenesis     Past Surgical History:  Procedure Laterality Date   COLONOSCOPY     KNEE ARTHROSCOPY Right 2012   ROBOT ASSISTED LAPAROSCOPIC RADICAL PROSTATECTOMY N/A 2010   [facility] urology     VASECTOMY  1977    Family History  Problem Relation Age of Onset   Cancer Mother    Heart disease Mother    Lymphoma Mother    Stroke Father    Hyperlipidemia Brother    Hypertension Brother    Atrial fibrillation Brother    Hyperlipidemia Brother    Hypertension Brother    Diabetes Brother    Hyperlipidemia Brother    Hypertension Brother    Colon cancer Neg Hx    Rectal cancer Neg Hx    Stomach cancer Neg Hx    Colon polyps Neg Hx     Social History   Socioeconomic History   Marital status: Married    Spouse name: Not on file   Number of children: 2   Years of education: Not on file   Highest education level: Bachelor's degree (e.g., BA, AB, BS)  Occupational History   Occupation: Nurse, adult    Comment: Retired   Occupation: Nurse, learning disability    Comment: Retired  Tobacco Use   Smoking status: Former    Current packs/day: 0.00    Average packs/day: 1 pack/day for 30.0 years (30.0 ttl pk-yrs)    Types: Cigarettes    Start date: 02/01/1985    Quit date: 02/02/2015    Years since quitting: 8.3   Smokeless tobacco: Never  Vaping Use   Vaping status: Never Used  Substance and Sexual Activity   Alcohol use: No    Alcohol/week: 0.0 standard drinks of alcohol   Drug use: No   Sexual activity: Yes  Other Topics Concern  Not on file  Social History Narrative   Divorced long ago   Remarried in 2022      Has living will   wife Marylene Land should make decisions--alternate is brother Tammy Sours   Would accept resuscitation   Would accept tube feeds short term--- but not if cognitively unaware   Social Drivers of Health   Financial Resource Strain: Low Risk  (05/31/2023)   Overall Financial Resource Strain (CARDIA)    Difficulty of Paying Living Expenses: Not hard at all  Food Insecurity: No Food Insecurity (05/31/2023)   Hunger Vital Sign    Worried About Running Out of Food in the Last Year: Never true    Ran Out of Food in the Last Year: Never true   Transportation Needs: No Transportation Needs (05/31/2023)   PRAPARE - Administrator, Civil Service (Medical): No    Lack of Transportation (Non-Medical): No  Physical Activity: Sufficiently Active (05/31/2023)   Exercise Vital Sign    Days of Exercise per Week: 5 days    Minutes of Exercise per Session: 60 min  Stress: No Stress Concern Present (05/31/2023)   [employer] of Occupational Health - Occupational Stress Questionnaire    Feeling of Stress : Only a little  Social Connections: Moderately Integrated (05/31/2023)   Social Connection and Isolation Panel [NHANES]    Frequency of Communication with Friends and Family: Once a week    Frequency of Social Gatherings with Friends and Family: Once a week    Attends Religious Services: More than 4 times per year    Active Member of [employer] or Organizations: Yes    Attends Banker Meetings: 1 to 4 times per year    Marital Status: Married  Catering manager Violence: Not At Risk (05/30/2022)   Humiliation, Afraid, Rape, and Kick questionnaire    Fear of Current or Ex-Partner: No    Emotionally Abused: No    Physically Abused: No    Sexually Abused: No   Review of Systems Appetite is good Weight fairly stable Sleeps fine Wears seat belt Having some dental issues---lost crown, getting implants No suspicious skin lesions today--does see derm No heartburn or dysphagia Bowels move okay--no blood No sig back or joint pains     Objective:   Physical Exam Constitutional:      Appearance: Normal appearance.  HENT:     Mouth/Throat:     Pharynx: No oropharyngeal exudate or posterior oropharyngeal erythema.  Eyes:     Conjunctiva/sclera: Conjunctivae normal.     Pupils: Pupils are equal, round, and reactive to light.  Cardiovascular:     Rate and Rhythm: Normal rate and regular rhythm.     Pulses: Normal pulses.     Heart sounds: No murmur heard.    No gallop.  Pulmonary:     Effort: Pulmonary effort  is normal.     Breath sounds: Normal breath sounds. No wheezing or rales.  Abdominal:     Palpations: Abdomen is soft.     Tenderness: There is no abdominal tenderness.  Musculoskeletal:     Cervical back: Neck supple.     Right lower leg: No edema.     Left lower leg: No edema.  Lymphadenopathy:     Cervical: No cervical adenopathy.  Skin:    Findings: No lesion or rash.  Neurological:     General: No focal deficit present.     Mental Status: He is alert and oriented to person, place, and time.  Comments: Mini-cog--normal  Psychiatric:        Mood and Affect: Mood normal.        Behavior: Behavior normal.            Assessment & Plan:

## 2023-06-01 NOTE — Assessment & Plan Note (Signed)
 Will recheck PSA

## 2023-06-01 NOTE — Assessment & Plan Note (Signed)
 Borderline

## 2023-06-01 NOTE — Assessment & Plan Note (Signed)
 BP Readings from Last 3 Encounters:  06/01/23 132/84  12/04/22 (!) 147/86  06/20/22 (!) 163/88   Has been better with fitness Inconsistent with propranolol (mostly for tremor)--will take regular

## 2023-06-02 LAB — HEPATITIS C ANTIBODY: Hepatitis C Ab: NONREACTIVE

## 2023-06-22 ENCOUNTER — Other Ambulatory Visit: Payer: Self-pay | Admitting: Internal Medicine

## 2023-07-14 ENCOUNTER — Other Ambulatory Visit: Payer: Self-pay

## 2023-07-14 ENCOUNTER — Emergency Department
Admission: EM | Admit: 2023-07-14 | Discharge: 2023-07-14 | Disposition: A | Discharge status: Home / Self Care | Attending: Emergency Medicine | Admitting: Emergency Medicine

## 2023-07-14 ENCOUNTER — Encounter: Payer: Self-pay | Admitting: Internal Medicine

## 2023-07-14 ENCOUNTER — Emergency Department

## 2023-07-14 DIAGNOSIS — Z8546 Personal history of malignant neoplasm of prostate: Secondary | ICD-10-CM | POA: Diagnosis not present

## 2023-07-14 DIAGNOSIS — Y9302 Activity, running: Secondary | ICD-10-CM | POA: Insufficient documentation

## 2023-07-14 DIAGNOSIS — W109XXA Fall (on) (from) unspecified stairs and steps, initial encounter: Secondary | ICD-10-CM | POA: Diagnosis not present

## 2023-07-14 DIAGNOSIS — M25511 Pain in right shoulder: Secondary | ICD-10-CM | POA: Diagnosis not present

## 2023-07-14 DIAGNOSIS — S0990XA Unspecified injury of head, initial encounter: Secondary | ICD-10-CM | POA: Diagnosis not present

## 2023-07-14 DIAGNOSIS — M75121 Complete rotator cuff tear or rupture of right shoulder, not specified as traumatic: Secondary | ICD-10-CM | POA: Diagnosis not present

## 2023-07-14 DIAGNOSIS — I6529 Occlusion and stenosis of unspecified carotid artery: Secondary | ICD-10-CM | POA: Diagnosis not present

## 2023-07-14 DIAGNOSIS — I10 Essential (primary) hypertension: Secondary | ICD-10-CM | POA: Insufficient documentation

## 2023-07-14 DIAGNOSIS — S199XXA Unspecified injury of neck, initial encounter: Secondary | ICD-10-CM | POA: Diagnosis not present

## 2023-07-14 NOTE — ED Triage Notes (Signed)
 Pt comes in today via pov with complaints of right shoulder pain. Pt states that he was running up some stairs yesterday, and fell hitting his head and landing on his shoulder. Pt reports LOC when he his his head. Pt with swelling to the right shoulder area. Pt is not on blood thinners.  Pt states that he is unable to raise his right to get full range of motion, and has laceration to the right side of his head.  Pt is alert and oriented x4, with no other signs of acute distress.

## 2023-07-14 NOTE — ED Provider Notes (Signed)
 [facility] Provider Note    Event Date/Time   First MD Initiated Contact with Patient 07/14/23 [id number]     (approximate)   History   Fall   HPI  Juan Holmes is a 73 y.o. male with PMH of benign essential tremor, prostate cancer and hypertension presents for evaluation after a fall.  Patient states that yesterday morning he was running up some stairs and fell forward hitting his head and landing on his shoulder.  Patient does think he lost consciousness for a few minutes, but no blood thinners.  Since the injury he has had pain and swelling to the right shoulder and feels like something is "just not right".  He has pain with movement of the shoulder.      Physical Exam   Triage Vital Signs: ED Triage Vitals  Encounter Vitals Group     BP 07/14/23 0800 (!) 167/89     Systolic BP Percentile --      Diastolic BP Percentile --      Pulse Rate 07/14/23 0800 74     Resp 07/14/23 0800 18     Temp 07/14/23 0800 98.4 F (36.9 C)     Temp Source 07/14/23 0800 Oral     SpO2 07/14/23 0800 97 %     Weight 07/14/23 0801 180 lb (81.6 kg)     Height 07/14/23 0801 6' (1.829 m)     Head Circumference --      Peak Flow --      Pain Score 07/14/23 0800 4     Pain Loc --      Pain Education --      Exclude from Growth Chart --     Most recent vital signs: Vitals:   07/14/23 0800  BP: (!) 167/89  Pulse: 74  Resp: 18  Temp: 98.4 F (36.9 C)  SpO2: 97%   General: Awake, no distress.  CV:  Good peripheral perfusion.  RRR. Resp:  Normal effort.  CTAB. Abd:  No distention.  R shoulder: Tender to palpation over the distal clavicle, acromion, patient unable to abduct shoulder past 90 degrees, can reach and touch the opposite shoulder, decreased strength with external rotation of the shoulder otherwise strength is well-maintained.  Radial pulses 2+ and regular.  Sensation maintained.  Other:  Multiple abrasions to the forehead, elbows and knees.  No focal  neurodeficits no ataxia.  PERRL, EOM intact.   ED Results / Procedures / Treatments   Labs (all labs ordered are listed, but only abnormal results are displayed) Labs Reviewed - No data to display   RADIOLOGY  CT head, CT neck and right shoulder x-ray obtained.  I interpreted the images as well as reviewed the radiologist report.  CT of the head and neck did not show any acute intracranial abnormalities and no traumatic listhesis of the spine.  Shoulder x-ray is negative.    PROCEDURES:  Critical Care performed: No  Procedures   MEDICATIONS ORDERED IN ED: Medications - No data to display   IMPRESSION / MDM / ASSESSMENT AND PLAN / ED COURSE  I reviewed the triage vital signs and the nursing notes.                             73 year old male presents for evaluation of right shoulder pain after a fall that occurred yesterday.  Blood pressure is elevated but he does have a history of hypertension.  Vital signs stable otherwise.  Patient NAD on exam.  Differential diagnosis includes, but is not limited to, shoulder fracture, shoulder dislocation, AC joint separation, rotator cuff tear.  Patient's presentation is most consistent with acute complicated illness / injury requiring diagnostic workup.  CT of the head and neck were negative for any acute abnormalities.  X-ray is negative.  I am suspicious of possible acromioclavicular joint injury given his tenderness to palpation over this area.  He also had some weakness with external rotation of the right shoulder which would be consistent with a rotator cuff injury.  Will place patient in a shoulder sling and have him follow-up with orthopedics.  Patient can have Tylenol and ibuprofen  as needed for pain.  Patient voiced understanding, all questions were answered and he was stable at discharge.      FINAL CLINICAL IMPRESSION(S) / ED DIAGNOSES   Final diagnoses:  Acute pain of right shoulder     Rx / DC Orders   ED  Discharge Orders     None        Note:  This document was prepared using Dragon voice recognition software and may include unintentional dictation errors.   Phyliss Breen, PA-C 07/14/23 4098    Arline Bennett, MD 07/14/23 1229

## 2023-07-14 NOTE — Discharge Instructions (Addendum)
 The x-ray of your right shoulder did not show any fractures.  I am still concerned about a ligament or muscular injury.  I recommend you wear the shoulder sling for additional comfort.    You can take 650 mg of Tylenol and 600 mg of ibuprofen  every 6 hours as needed for pain. You can use ice, heat, muscle creams and other topical pain relievers as well.   You can return to activity as tolerated.

## 2023-07-16 ENCOUNTER — Ambulatory Visit: Admitting: Sports Medicine

## 2023-07-16 VITALS — BP 118/78 | HR 58 | Ht 72.0 in | Wt 180.0 lb

## 2023-07-16 DIAGNOSIS — M25511 Pain in right shoulder: Secondary | ICD-10-CM

## 2023-07-16 DIAGNOSIS — S43101A Unspecified dislocation of right acromioclavicular joint, initial encounter: Secondary | ICD-10-CM | POA: Diagnosis not present

## 2023-07-16 NOTE — Patient Instructions (Signed)
 MRI right shoulder  Ortho referral No heavy lifting  Naproxen 220 mg 2x daily as needed  As needed follow up

## 2023-07-16 NOTE — Progress Notes (Signed)
    Ben Siana Panameno D.[facility] [address] [place] [address] Phone: [phone]   Assessment and Plan:     1. Acute pain of right shoulder 2. Acromioclavicular joint separation, right, initial encounter  -Acute, complicated, initial visit - Consistent with [facility] joint separation caused from fall on the right shoulder on 07/13/2023.  Patient is a physically active individual and I fear conservative therapy could lead to chronic issues limiting his activity.  Because of this, I recommend further evaluation with orthopedic surgery.  Due to evidence of separation on physical exam and x-ray imaging, instability, will obtain right shoulder MRI to further evaluate ligamentous damage - Patient has been receiving benefit with naproxen 220 mg daily.  May continue naproxen 220 mg daily - Recommend no lifting >10 pounds with right upper extremity - Patient does not have pain with normal ambulation without sling.  If pain-free, patient may avoid using sling to prevent joint stiffness and weakening  Pertinent previous records reviewed include ER note 07/14/2023, right shoulder x-ray 07/14/2023  Follow Up: As needed   Subjective:    Chief Complaint: right shoulder pain   HPI:   07/16/23 Patient is a 73 year old male with right shoulder pain. Patient states fell Monday morning he tripped up some steps that were slippery. He was given a sling in ED. No numbness or tingling. He has naproxen 800 mg and that helped. Decreased ROM. Salonpas helped. No radiating pain   Relevant Historical Information: Hypertension, congenital single kidney, history of prostate cancer  Additional pertinent review of systems negative.   Current Outpatient Medications:    propranolol  ER (INDERAL  LA) 80 MG 24 hr capsule, TAKE ONE CAPSULE (80 MG TOTAL) BY MOUTH DAILY., Disp: 90 capsule, Rfl: 3   sildenafil  (REVATIO ) 20 MG tablet, TAKE THREE TO FIVE TABLETS BY MOUTH DAILY AS NEEDED (NEED  OFFICE VISIT FOR REFILLS), Disp: 50 tablet, Rfl: 11   Objective:     Vitals:   07/16/23 1107  BP: 118/78  Pulse: (!) 58  SpO2: 98%  Weight: 180 lb (81.6 kg)  Height: 6' (1.829 m)      Body mass index is 24.41 kg/m.    Physical Exam:    Gen: Appears well, nad, nontoxic and pleasant Neuro:sensation intact, strength is 5/5 with df/pf/inv/ev, muscle tone wnl Skin: no suspicious lesion or defmority Psych: A&O, appropriate mood and affect  Right shoulder:  Elevation of distal clavicle on right compared to left.  Compressible and floating distal right clavicle with tenderness and localized edema No scapular winging FF 80, abd 80, int 0, ext 90 TTP clavicle, AC, coracoid NTTP over the Berry,  biceps groove, humerus, deltoid, trapezius, cervical spine  Negative Spurling's test bilat FROM of neck    Electronically signed by:  Marshall Skeeter D.[facility] 12:05 PM 07/16/23

## 2023-07-18 ENCOUNTER — Ambulatory Visit (INDEPENDENT_AMBULATORY_CARE_PROVIDER_SITE_OTHER)

## 2023-07-18 DIAGNOSIS — M75111 Incomplete rotator cuff tear or rupture of right shoulder, not specified as traumatic: Secondary | ICD-10-CM | POA: Diagnosis not present

## 2023-07-18 DIAGNOSIS — M25511 Pain in right shoulder: Secondary | ICD-10-CM | POA: Diagnosis not present

## 2023-07-18 DIAGNOSIS — M19011 Primary osteoarthritis, right shoulder: Secondary | ICD-10-CM | POA: Diagnosis not present

## 2023-07-18 DIAGNOSIS — M7581 Other shoulder lesions, right shoulder: Secondary | ICD-10-CM | POA: Diagnosis not present

## 2023-07-18 DIAGNOSIS — M25411 Effusion, right shoulder: Secondary | ICD-10-CM | POA: Diagnosis not present

## 2023-07-19 ENCOUNTER — Other Ambulatory Visit

## 2023-07-20 ENCOUNTER — Ambulatory Visit: Admitting: Family Medicine

## 2023-07-24 ENCOUNTER — Encounter: Payer: Self-pay | Admitting: Internal Medicine

## 2023-07-24 NOTE — Telephone Encounter (Signed)
 Thank you :)

## 2023-07-27 ENCOUNTER — Encounter: Payer: Self-pay | Admitting: Internal Medicine

## 2023-07-27 ENCOUNTER — Ambulatory Visit (INDEPENDENT_AMBULATORY_CARE_PROVIDER_SITE_OTHER): Admitting: Internal Medicine

## 2023-07-27 ENCOUNTER — Ambulatory Visit: Payer: Self-pay | Admitting: Sports Medicine

## 2023-07-27 VITALS — BP 126/80 | HR 50 | Temp 98.4°F | Ht 72.0 in | Wt 184.0 lb

## 2023-07-27 DIAGNOSIS — L03012 Cellulitis of left finger: Secondary | ICD-10-CM | POA: Insufficient documentation

## 2023-07-27 MED ORDER — DOXYCYCLINE HYCLATE 100 MG PO TABS: 100.0000 mg | ORAL_TABLET | Freq: Two times a day (BID) | ORAL | 2 refills | Status: DC

## 2023-07-27 NOTE — Progress Notes (Signed)
 Subjective:    Patient ID: Juan Holmes, male    DOB: 07-09-1950, 73 y.o.   MRN: [id number]  HPI Here with concerns about his infections at fingernails  Starting a couple of months ago--getting infections around his nail beds Left 2nd and 3rd and right 3rd finger ?related to cleaning cat bowls without gloves Will drain pus--and them improves greatly No fever Comes and goes--but never gone  Current Outpatient Medications on File Prior to Visit  Medication Sig Dispense Refill   propranolol  ER (INDERAL  LA) 80 MG 24 hr capsule TAKE ONE CAPSULE (80 MG TOTAL) BY MOUTH DAILY. 90 capsule 3   sildenafil  (REVATIO ) 20 MG tablet TAKE THREE TO FIVE TABLETS BY MOUTH DAILY AS NEEDED (NEED OFFICE VISIT FOR REFILLS) 50 tablet 11   No current facility-administered medications on file prior to visit.    Allergies  Allergen Reactions   Penicillins Hives    Past Medical History:  Diagnosis Date   Allergic rhinitis due to pollen    Angiodysplasia of cecum 04/03/2015   Benign essential tremor 03/10/1996   Colon polyps 03/10/2009   adenomatous   ED (erectile dysfunction)    Prostate cancer (HCC) 03/10/2008   total prostatectomy   Renal agenesis     Past Surgical History:  Procedure Laterality Date   COLONOSCOPY     KNEE ARTHROSCOPY Right 2012   ROBOT ASSISTED LAPAROSCOPIC RADICAL PROSTATECTOMY N/A 2010   [facility] urology    VASECTOMY  1977    Family History  Problem Relation Age of Onset   Cancer Mother    Heart disease Mother    Lymphoma Mother    Stroke Father    Hyperlipidemia Brother    Hypertension Brother    Atrial fibrillation Brother    Hyperlipidemia Brother    Hypertension Brother    Diabetes Brother    Hyperlipidemia Brother    Hypertension Brother    Colon cancer Neg Hx    Rectal cancer Neg Hx    Stomach cancer Neg Hx    Colon polyps Neg Hx     Social History   Socioeconomic History   Marital status: Married    Spouse name: Not on file   Number of  children: 2   Years of education: Not on file   Highest education level: Bachelor's degree (e.g., BA, AB, BS)  Occupational History   Occupation: Nurse, adult    Comment: Retired   Occupation: Nurse, learning disability    Comment: Retired  Tobacco Use   Smoking status: Former    Current packs/day: 0.00    Average packs/day: 1 pack/day for 30.0 years (30.0 ttl pk-yrs)    Types: Cigarettes    Start date: 02/01/1985    Quit date: 02/02/2015    Years since quitting: 8.4   Smokeless tobacco: Never  Vaping Use   Vaping status: Never Used  Substance and Sexual Activity   Alcohol use: No    Alcohol/week: 0.0 standard drinks of alcohol   Drug use: No   Sexual activity: Yes  Other Topics Concern   Not on file  Social History Narrative   Divorced long ago   Remarried in 2022      Has living will   wife Shelvy Dickens should make decisions--alternate is brother Erla Haw   Would accept resuscitation   Would accept tube feeds short term--- but not if cognitively unaware   Social Drivers of Health   Financial Resource Strain: Low Risk  (05/31/2023)   Overall Physicist, medical Strain (  CARDIA)    Difficulty of Paying Living Expenses: Not hard at all  Food Insecurity: No Food Insecurity (05/31/2023)   Hunger Vital Sign    Worried About Running Out of Food in the Last Year: Never true    Ran Out of Food in the Last Year: Never true  Transportation Needs: No Transportation Needs (05/31/2023)   PRAPARE - Administrator, Civil Service (Medical): No    Lack of Transportation (Non-Medical): No  Physical Activity: Sufficiently Active (05/31/2023)   Exercise Vital Sign    Days of Exercise per Week: 5 days    Minutes of Exercise per Session: 60 min  Stress: No Stress Concern Present (05/31/2023)   [employer] of Occupational Health - Occupational Stress Questionnaire    Feeling of Stress : Only a little  Social Connections: Moderately Integrated (05/31/2023)   Social Connection and  Isolation Panel [NHANES]    Frequency of Communication with Friends and Family: Once a week    Frequency of Social Gatherings with Friends and Family: Once a week    Attends Religious Services: More than 4 times per year    Active Member of [employer] or Organizations: Yes    Attends Banker Meetings: 1 to 4 times per year    Marital Status: Married  Catering manager Violence: Not At Risk (05/30/2022)   Humiliation, Afraid, Rape, and Kick questionnaire    Fear of Current or Ex-Partner: No    Emotionally Abused: No    Physically Abused: No    Sexually Abused: No   Review of Systems Had recent fall and apparent A-C separation on right. Awaiting ortho evaluation--while up in [place] at a friend's mother's funeral Marvell Slider in the rain---was unconscious briefly. Finally awoke--got up and came back. Couldn't move his shoulder at all. Then stuck 13 hours on transfer in [place] Has improved since then--but still going to ortho     Objective:   Physical Exam Constitutional:      Appearance: Normal appearance.  Skin:    Comments: Mild inflammation along nail beds--- left 2nd and 3rd No drainage now Slightly tender Right finger is okay now  Neurological:     Mental Status: He is alert.            Assessment & Plan:

## 2023-07-27 NOTE — Assessment & Plan Note (Signed)
 Will give doxy 100  bid x 7 days (may not need the whole course) Continue soaks, etc prn Will give refill just in case

## 2023-08-06 ENCOUNTER — Ambulatory Visit (INDEPENDENT_AMBULATORY_CARE_PROVIDER_SITE_OTHER): Admitting: Orthopaedic Surgery

## 2023-08-06 ENCOUNTER — Ambulatory Visit (HOSPITAL_BASED_OUTPATIENT_CLINIC_OR_DEPARTMENT_OTHER): Payer: Self-pay | Admitting: Orthopaedic Surgery

## 2023-08-06 ENCOUNTER — Other Ambulatory Visit (HOSPITAL_BASED_OUTPATIENT_CLINIC_OR_DEPARTMENT_OTHER): Payer: Self-pay

## 2023-08-06 DIAGNOSIS — S4991XA Unspecified injury of right shoulder and upper arm, initial encounter: Secondary | ICD-10-CM | POA: Diagnosis not present

## 2023-08-06 MED ORDER — IBUPROFEN 800 MG PO TABS
800.0000 mg | ORAL_TABLET | Freq: Three times a day (TID) | ORAL | 0 refills | Status: AC
  Filled 2023-08-06: qty 30, 10d supply, fill #0

## 2023-08-06 MED ORDER — OXYCODONE HCL 5 MG PO TABS
5.0000 mg | ORAL_TABLET | ORAL | 0 refills | Status: DC | PRN
  Filled 2023-08-06: qty 10, 2d supply, fill #0

## 2023-08-06 MED ORDER — ASPIRIN 325 MG PO TBEC
325.0000 mg | DELAYED_RELEASE_TABLET | Freq: Every day | ORAL | 0 refills | Status: DC
  Filled 2023-08-06: qty 14, 14d supply, fill #0

## 2023-08-06 MED ORDER — ACETAMINOPHEN 500 MG PO TABS
500.0000 mg | ORAL_TABLET | Freq: Three times a day (TID) | ORAL | 0 refills | Status: AC
  Filled 2023-08-06: qty 30, 10d supply, fill #0

## 2023-08-06 NOTE — Progress Notes (Signed)
 Chief Complaint: Right shoulder injury     History of Present Illness:    Juan Holmes is a 73 y.o. male right-hand-dominant male presents with an acute right acromioclavicular injury after a fall and landed directly onto his shoulder after he had passed out and suffered a concussion with a fall directly on the side while visiting family in [place] .  He is still very active and enjoys going to the gym where he lives in [place].  He has been having significant shoulder pain after his injury and Dr. Cleora Daft has been working this up with both x-ray and MRI.  He still has persistent clicking and pain particular cross-body adduction of the right shoulder.  This has been limiting his ability to perform overhead activity and stay active.  He is subsequently here today for further surgical discussion    PMH/PSH/Family History/Social History/Meds/Allergies:    Past Medical History:  Diagnosis Date   Allergic rhinitis due to pollen    Angiodysplasia of cecum 04/03/2015   Benign essential tremor 03/10/1996   Colon polyps 03/10/2009   adenomatous   ED (erectile dysfunction)    Prostate cancer (HCC) 03/10/2008   total prostatectomy   Renal agenesis    Past Surgical History:  Procedure Laterality Date   COLONOSCOPY     KNEE ARTHROSCOPY Right 2012   ROBOT ASSISTED LAPAROSCOPIC RADICAL PROSTATECTOMY N/A 2010   [facility] u[facility]    VASECTOMY  1977   Social History   Socioeconomic History   Marital status: Married    Spouse name: Not on file   Number of children: 2   Years of education: Not on file   Highest education level: Bachelor's degree (e.g., BA, AB, BS)  Occupational History   Occupation: Nurse, adult    Comment: Retired   Occupation: Nurse, learning disability    Comment: Retired  Tobacco Use   Smoking status: Former    Current packs/day: 0.00    Average packs/day: 1 pack/day for 30.0 years (30.0 ttl pk-yrs)    Types: Cigarettes    Start date: 02/01/1985    Quit  date: 02/02/2015    Years since quitting: 8.5   Smokeless tobacco: Never  Vaping Use   Vaping status: Never Used  Substance and Sexual Activity   Alcohol use: No    Alcohol/week: 0.0 standard drinks of alcohol   Drug use: No   Sexual activity: Yes  Other Topics Concern   Not on file  Social History Narrative   Divorced long ago   Remarried in 2022      Has living will   wife Shelvy Dickens should make decisions--alternate is brother Erla Haw   Would accept resuscitation   Would accept tube feeds short term--- but not if cognitively unaware   Social Drivers of Health   Financial Resource Strain: Low Risk  (05/31/2023)   Overall Financial Resource Strain (CARDIA)    Difficulty of Paying Living Expenses: Not hard at all  Food Insecurity: No Food Insecurity (05/31/2023)   Hunger Vital Sign    Worried About Running Out of Food in the Last Year: Never true    Ran Out of Food in the Last Year: Never true  Transportation Needs: No Transportation Needs (05/31/2023)   PRAPARE - Administrator, Civil Service (Medical): No    Lack of Transportation (Non-Medical): No  Physical Activity: Sufficiently Active (05/31/2023)   Exercise Vital Sign    Days of Exercise per Week: 5 days    Minutes of  Exercise per Session: 60 min  Stress: No Stress Concern Present (05/31/2023)   [employer] of Occupational Health - Occupational Stress Questionnaire    Feeling of Stress : Only a little  Social Connections: Moderately Integrated (05/31/2023)   Social Connection and Isolation Panel [NHANES]    Frequency of Communication with Friends and Family: Once a week    Frequency of Social Gatherings with Friends and Family: Once a week    Attends Religious Services: More than 4 times per year    Active Member of [employer] or Organizations: Yes    Attends Banker Meetings: 1 to 4 times per year    Marital Status: Married   Family History  Problem Relation Age of Onset   Cancer Mother    Heart  disease Mother    Lymphoma Mother    Stroke Father    Hyperlipidemia Brother    Hypertension Brother    Atrial fibrillation Brother    Hyperlipidemia Brother    Hypertension Brother    Diabetes Brother    Hyperlipidemia Brother    Hypertension Brother    Colon cancer Neg Hx    Rectal cancer Neg Hx    Stomach cancer Neg Hx    Colon polyps Neg Hx    Allergies  Allergen Reactions   Clindamycin /Lincomycin Diarrhea    Got C diff   Penicillins Hives   Current Outpatient Medications  Medication Sig Dispense Refill   acetaminophen  (TYLENOL ) 500 MG tablet Take 1 tablet (500 mg total) by mouth every 8 (eight) hours for 10 days. 30 tablet 0   aspirin  EC 325 MG tablet Take 1 tablet (325 mg total) by mouth daily. 14 tablet 0   ibuprofen  (ADVIL ) 800 MG tablet Take 1 tablet (800 mg total) by mouth every 8 (eight) hours for 10 days. Please take with food, please alternate with acetaminophen  30 tablet 0   oxyCODONE  (ROXICODONE ) 5 MG immediate release tablet Take 1 tablet (5 mg total) by mouth every 4 (four) hours as needed for severe pain (pain score 7-10) or breakthrough pain. 10 tablet 0   doxycycline  (VIBRA -TABS) 100 MG tablet Take 1 tablet (100 mg total) by mouth 2 (two) times daily. 14 tablet 2   propranolol  ER (INDERAL  LA) 80 MG 24 hr capsule TAKE ONE CAPSULE (80 MG TOTAL) BY MOUTH DAILY. 90 capsule 3   sildenafil  (REVATIO ) 20 MG tablet TAKE THREE TO FIVE TABLETS BY MOUTH DAILY AS NEEDED (NEED OFFICE VISIT FOR REFILLS) 50 tablet 11   No current facility-administered medications for this visit.   No results found.  Review of Systems:   A ROS was performed including pertinent positives and negatives as documented in the HPI.  Physical Exam :   Constitutional: NAD and appears stated age Neurological: Alert and oriented Psych: Appropriate affect and cooperative There were no vitals taken for this visit.   Comprehensive Musculoskeletal Exam:    Shoulder with tenderness about the [facility]  joint with obvious deformity.  Cross body adduction worsens this deformity with palpable click and pop which is painful.  He has active forward elevation about the glenohumeral joint to 170 degrees bilaterally with external rotation at the side to 50 degrees bilaterally.  Internal rotation is to L1 bilaterally.  Distal neurosensory exam is intact   Imaging:   Xray (views right shoulder): Grade 3 AC joint injury  MRI (right shoulder): Complete disruption of the coracoclavicular ligaments as well as of the [facility] joint capsule   I personally reviewed  and interpreted the radiographs.   Assessment and Plan:   73 y.o. male right dominant male with high-grade AC joint injury.  I did discuss that his MRI is more concerning for a high-grade injury given the complete disruption of the CC ligaments as well as the AC joint capsule.  Given this I do believe he is at very high risk for continued instability and pain with cross body adduction particular with overhead activity.  Given that we did discuss the possibility of an [facility] joint repair as well as dog bone fixation with internal brace.  I did discuss risks and benefits as well as associated recovery timeframe.  I did discuss limitations.  After discussion he would like to proceed with this.  -Plan for right shoulder arthroscopically assisted acromioclavicular joint repair    After a lengthy discussion of treatment options, including risks, benefits, alternatives, complications of surgical and nonsurgical conservative options, the patient elected surgical repair.   The patient  is aware of the material risks  and complications including, but not limited to injury to adjacent structures, neurovascular injury, infection, numbness, bleeding, implant failure, thermal burns, stiffness, persistent pain, failure to heal, disease transmission from allograft, need for further surgery, dislocation, anesthetic risks, blood clots, risks of death,and others. The  probabilities of surgical success and failure discussed with patient given their particular co-morbidities.The time and nature of expected rehabilitation and recovery was discussed.The patient's questions were all answered preoperatively.  No barriers to understanding were noted. I explained the natural history of the disease process and Rx rationale.  I explained to the patient what I considered to be reasonable expectations given their personal situation.  The final treatment plan was arrived at through a shared patient decision making process model.    I personally saw and evaluated the patient, and participated in the management and treatment plan.  Wilhelmenia Harada, MD Attending Physician, Orthopedic Surgery  This document was dictated using Dragon voice recognition software. A reasonable attempt at proof reading has been made to minimize errors.

## 2023-08-07 ENCOUNTER — Other Ambulatory Visit (HOSPITAL_BASED_OUTPATIENT_CLINIC_OR_DEPARTMENT_OTHER): Payer: Self-pay | Admitting: Orthopaedic Surgery

## 2023-08-07 ENCOUNTER — Encounter (HOSPITAL_BASED_OUTPATIENT_CLINIC_OR_DEPARTMENT_OTHER): Payer: Self-pay | Admitting: Orthopaedic Surgery

## 2023-08-07 DIAGNOSIS — S4991XA Unspecified injury of right shoulder and upper arm, initial encounter: Secondary | ICD-10-CM

## 2023-08-13 ENCOUNTER — Encounter (HOSPITAL_BASED_OUTPATIENT_CLINIC_OR_DEPARTMENT_OTHER): Payer: Self-pay | Admitting: Orthopaedic Surgery

## 2023-08-13 DIAGNOSIS — S43101A Unspecified dislocation of right acromioclavicular joint, initial encounter: Secondary | ICD-10-CM | POA: Diagnosis not present

## 2023-08-13 DIAGNOSIS — G8918 Other acute postprocedural pain: Secondary | ICD-10-CM | POA: Diagnosis not present

## 2023-08-13 DIAGNOSIS — S4991XA Unspecified injury of right shoulder and upper arm, initial encounter: Secondary | ICD-10-CM | POA: Diagnosis not present

## 2023-08-13 NOTE — Progress Notes (Signed)
 Date of Surgery: 08/13/2023  INDICATIONS: Juan Holmes is a 73 y.o.-year-old male with displaced right acromioclavicular joint.  The risk and benefits of the procedure were discussed in detail and documented in the pre-operative evaluation.   PREOPERATIVE DIAGNOSIS: 1. Displaced right acromioclavicular joint  POSTOPERATIVE DIAGNOSIS: Same.  PROCEDURE: 1. Open repair right acromioclavicular joint 2.  Right shoulder arthroscopy with limited debridement  SURGEON: Carmina Chris MD  ASSISTANT: Deon Flatter, ATC  ANESTHESIA:  general  IV FLUIDS AND URINE: See anesthesia record.  ANTIBIOTICS: Ancef  ESTIMATED BLOOD LOSS: 25 mL.  IMPLANTS:  * No surgical log found *  DRAINS: None  CULTURES: None  COMPLICATIONS: none  DESCRIPTION OF PROCEDURE:     I identified the patient in the pre-operative holding area.  I marked the operative right shoulder with my initials. I reviewed the risks and benefits of the proposed surgical intervention and the patient (and/or patient's guardian) wished to proceed.  Anesthesia was then performed with regional block.  The patient was transferred to the operative suite and placed in the beach chair position with all bony prominences padded.     SCDs were placed on bilateral lower extremity. Appropriate antibiotics was administered within 1 hour before incision. Regional anesthesia was administered.The operative extremity was then prepped and draped in standard fashion. A time out was performed confirming the correct extremity, correct patient and correct procedure.   The arthroscope was introduced in the glenohumeral joint from a posterior portal.  An anteriorlateral portal was created.  The shoulder was examined and the above findings were noted.    With an arthroscopic shaver and an ArthroCare wand, synovitis throughout the shoulder was resected.  The arthroscopic shaver was used to excise torn portions of the labrum back to a stable margin. Through  the anterior portal, a combination of shaver and electrocautery wand were used to expose the coracoid. Then the 30 degree camera was used to allow visualization of the base of the coracoid. A combination of shaver and arthrocare wand was used to expose.   An incision was made from the posterior lateral clavicle to the lateral acromion. The incision was carried down sharply to the level of the deltotrapezial fascia. The deltotrapezial fascia was released anterior and posterior over the midline of the clavicle and acromion. The fascia was elevated anterior and posteriorly with Bovie cautery until Hohmann retractors could be placed under the clavicle. The lateral 10 mm of the clavicle was preserved. The AC capsule anterior and posterior was preserved. The superior capsule of the [facility] joint was incised in line with the clavicle. 2 distal extremity fibertacks were placed at either end acromion and clavicle and placed into the knotless mechanism and tensioned with the before meals joint in a reduced fashion this provided a provisional reduction which was excellent.  At this time the guide was introduced into the anterolateral portal under the coracoid.  The provisional wire was placed through the clavicle and drilled into the coracoid.  A nitinol wire was ultimately passed anterolateral portal.  The suture was retroacetabular clavicle.  The dogbone was attached to the suture.  This was done under direct visualization. At this time again holding the shoulder reduced the tight rope mechanism was tensioned. The fibertack anchors were again provisionally reduced.  The deltotrapezial fascia was carefully reapproximated with interrupted sutures of 0 Vicryl. The skin was closed with interrupted inverted 2-0 vicryl suture and a running 2-0 Prolene. The arthroscopy portals were closed with 2-0 prolene. Xeroform gauze,  sterile dressings, OpSites were placed. Final counts were correct. The patient was placed in a shoulder  sling. An ice unit and a was placed on the shoulder.      POSTOPERATIVE PLAN: He will be active range of motion once his block wears off.  He will be placed on aspirin  for blood clot prevention.  I'll see him back in 2 weeks for suture removal  Carmina Chris, MD 5:01 PM

## 2023-08-18 ENCOUNTER — Encounter (HOSPITAL_BASED_OUTPATIENT_CLINIC_OR_DEPARTMENT_OTHER): Payer: Self-pay | Admitting: Orthopaedic Surgery

## 2023-08-19 NOTE — Therapy (Signed)
 OUTPATIENT PHYSICAL THERAPY SHOULDER/ELBOW EVALUATION  Patient Name: Juan Holmes MRN: [id number] DOB:05/28/1950, 73 y.o., male Today's Date: 08/20/2023  END OF SESSION:  PT End of Session - 08/20/23 1233     Visit Number 1    Number of Visits 25    Date for PT Re-Evaluation 11/12/23    Progress Note Due on Visit 10    PT Start Time 1116    PT Stop Time 1157    PT Time Calculation (min) 41 min    Activity Tolerance Patient tolerated treatment well    Behavior During Therapy [facility] for tasks assessed/performed          Past Medical History:  Diagnosis Date   Allergic rhinitis due to pollen    Angiodysplasia of cecum 04/03/2015   Benign essential tremor 03/10/1996   Colon polyps 03/10/2009   adenomatous   ED (erectile dysfunction)    Prostate cancer (HCC) 03/10/2008   total prostatectomy   Renal agenesis    Past Surgical History:  Procedure Laterality Date   COLONOSCOPY     KNEE ARTHROSCOPY Right 2012   ROBOT ASSISTED LAPAROSCOPIC RADICAL PROSTATECTOMY N/A 2010   [facility] urology    VASECTOMY  1977   Patient Active Problem List   Diagnosis Date Noted   Paronychia of finger, left 07/27/2023   Essential hypertension, benign 06/01/2023   Rosacea 05/23/2022   Advance directive discussed with patient 11/30/2019   History of colonic polyps 04/03/2015   Angiodysplasia of cecum 04/03/2015   Hyperlipemia 08/11/2014   Alcohol dependence in remission (HCC)    Routine general medical examination at a health care facility 08/31/2012   Benign essential tremor    History of prostate cancer    Allergic rhinitis due to pollen    ED (erectile dysfunction)    Congenital single kidney     PCP: Helaine Llanos, MD   REFERRING PROVIDER:  Wilhelmenia Harada, MD   REFERRING DIAG:  [phone] (ICD-10-CM) - Injury of right acromioclavicular joint, initial encounter   RATIONALE FOR EVALUATION AND TREATMENT: Rehabilitation  THERAPY DIAG: Acute pain of right shoulder  Muscle  weakness (generalized)  Stiffness of right shoulder, not elsewhere classified  ONSET DATE: s/p AC Joint Repair (08/13/2023)  FOLLOW-UP APPT SCHEDULED WITH REFERRING PROVIDER: Yes  08/27/2023.   SUBJECTIVE:                                                                                                                                                                                         SUBJECTIVE STATEMENT:    Patient with chief concern of R Shoulder Pain s/p AC joint repair  PERTINENT HISTORY:  Juan Holmes is 73 y.o. male reporting to OPPT s/p [facility] joint repair via [place] procedure on 08/13/2023. Patient reports that he injured [facility] joint following a fall and landing on his shoulder. Patient reports that he removed sling since surgery and is able to perform some iADLs without difficuty (I.e.donning a shirt). He has difficulty with shaving using RUE. Pain aggravated with moving into outward reaching (GHJ abd) cross body motion (GHJ add). Pain relieved with ice modalities, rest and gentle range of motion when waking in the AM. Prior to the injury, patient was very active in the community and exercised frequently. Patient participating in swimming, golf, and gym program. Patient denied chills/fever, numbness/tingling, loss of sensation.   Imaging (Per Chart Review 08/20/2023):  CLINICAL DATA:  Right shoulder pain.   EXAM: MRI OF THE RIGHT SHOULDER WITHOUT CONTRAST   TECHNIQUE: Multiplanar, multisequence MR imaging of the shoulder was performed. No intravenous contrast was administered.   COMPARISON:  Right shoulder radiographs 07/14/2023   FINDINGS: Rotator cuff: There is a small focus of fluid bright signal indicating a tiny partial-thickness midsubstance insertional tear measuring up to 3 mm in transverse dimension (coronal image 9) and 3 mm in AP dimension (sagittal image 18) within the mid to posterior aspect of the supraspinatus tendon footprint insertion. Mild intermediate  T2 signal tendinosis of the mid to posterior supraspinatus. The infraspinatus, subscapularis, and teres minor are intact.   Muscles: No rotator cuff muscle atrophy, fatty infiltration, or edema.   Biceps long head: Mild-to-moderate intermediate T2 signal and thickening tendinosis of the proximal long head of the biceps tendon.   Acromioclavicular Joint: There is again approximate 8 mm elevation of the clavicular head with respect to the acromion. Small acromioclavicular joint effusion with adjacent and possibly connected fluid within the deep aspect of the superior subcutaneous fat. Mild to moderate peripheral clavicular head degenerative osteophytes. Type II acromion. No fluid within the subacromial/subdeltoid bursa.   Glenohumeral Joint: Moderate thinning of the posterior glenoid cartilage moderate posterior subchondral cystic change. Moderate inferior medial humeral head cartilage thinning. Subchondral cysts are also seen within the anterior superior medial humeral head. No glenohumeral joint effusion.   Labrum: There is fluid bright signal indicating a chondrolabral junction tear of the posterior mid to superior glenoid (axial images 11 through 14).   Bones:  No acute fracture.   Other: None.   IMPRESSION: 1. Tiny partial-thickness midsubstance insertional tear of the mid to posterior supraspinatus tendon footprint insertion measuring only 3 mm in transverse dimension and 3 mm in AP dimension. Mild underlying mid to posterior supraspinatus tendinosis. 2. Mild-to-moderate proximal long head of the biceps tendinosis. 3. Moderate glenohumeral cartilage degenerative changes. 4. Chondrolabral junction tear of the posterior mid to superior glenoid. 5. Unchanged elevation of the clavicular head with respect to the acromion. Small acromioclavicular joint effusion with adjacent and possibly connected fluid within the deep aspect of the superior subcutaneous fat. This is  consistent with a type II [place] classification acromioclavicular joint injury.   Electronically Signed   By: Bertina Broccoli M.D.   On: 07/25/2023 18:08   PAIN:  Pain Intensity: Present: 0/10, Best: 0/10, Worst: 7/10 Pain location: R Shoulder  Pain Quality: intermittent and stabbing  Radiating: No  Numbness/Tingling: No Focal Weakness: Yes Aggravating factors: moving the Arm, outward reaching  Relieving factors: Resting, Ice 24-hour pain behavior: AM: soreness and stiffness   History of prior shoulder or neck/shoulder injury, pain, surgery, or therapy: Yes Dominant hand: right  PRECAUTIONS: None  WEIGHT BEARING RESTRICTIONS: No  FALLS: Has patient fallen in last 6 months? Yes. Number of falls 1 Patent reports fall forward up stairs   Living Environment Lives with: lives with their spouse Lives in: House/apartment Stairs: Yes: Internal: 13 steps; can reach both Has following equipment at home: None  Prior level of function: Independent  Occupational demands: Retired   Hobbies: Golf   Patient Goals: Pt states he would like to get back to the gym/swim as fast as possible.  OBJECTIVE:   Patient Surveys  QuickDASH:   Cognition Patient is oriented to person, place, and time.  Recent memory is intact.  Remote memory is intact.  Attention span and concentration are intact.  Expressive speech is intact.  Patient's fund of knowledge is within normal limits for educational level.    Gross Musculoskeletal Assessment Tremor: None Bulk: Normal Tone: Normal  Gait Within Normal Limits   Posture Seated: Rounded Shoulder   Cervical Screen AROM: WFL and painless with overpressure in all planes  AROM AROM (Normal range in degrees) AROM/PROM  Cervical  Flexion (50)   Extension (80)   Right lateral flexion (45)   Left lateral flexion (45)   Right rotation (85)   Left rotation (85)    Right Left  Shoulder    Flexion Surgical Limitations/90 180  Extension  Surgical Limitations   Abduction Surgical Limitations/90 150  External Rotation 60 60  Internal Rotation WNL* WNL  Hands Behind Head    Hands Behind Back        Elbow    Flexion    Extension    Pronation    Supination    (* = pain; Blank rows = not tested)  UE MMT: MMT (out of 5) Right Left   Cervical (isometric)  Flexion WNL  Extension WNL  Lateral Flexion WNL WNL  Rotation WNL WNL      Shoulder   Flexion Surgical Limitations 4  Extension    Abduction Surgical Limitations 4  External rotation 4 4  Internal rotation 4 4  Horizontal abduction    Horizontal adduction    Lower Trapezius    Rhomboids        Elbow  Flexion 5 5  Extension 4 4  Pronation 5 5  Supination 5 5      Wrist  Flexion    Extension    Radial deviation    Ulnar deviation        MCP  Flexion    Extension    Abduction    Adduction    (* = pain; Blank rows = not tested)  Sensation Grossly intact to light touch bilateral UE as determined by testing dermatomes C2-T2. Proprioception and hot/cold testing deferred on this date.  Reflexes Deferred  Palpation Location LEFT  RIGHT           Subocciptials    Cervical paraspinals  0  Upper Trapezius  1  Levator Scapulae  1  Rhomboid Major/Minor    Sternoclavicular joint    Acromioclavicular joint    Coracoid process    Long head of biceps  0  Supraspinatus  0  Infraspinatus  0  Subscapularis    Teres Minor  0  Teres Major    Pectoralis Major  0  Pectoralis Minor  0  Anterior Deltoid  0  Lateral Deltoid  0  Posterior Deltoid    Latissimus Dorsi    Sternocleidomastoid    (Blank rows = not tested) Graded on 0-4 scale (  0 = no pain, 1 = pain, 2 = pain with wincing/grimacing/flinching, 3 = pain with withdrawal, 4 = unwilling to allow palpation), (Blank rows = not tested)   Passive Accessory Intervertebral Motion Deferred  Accessory Motions/Glides Deferred    TODAY'S TREATMENT: DATE:08/20/2023   PT demo with pt return  demo on initial HEP:  Circular Shoulder Pendulum with Table Support    1 x 5 CW  - verbalized understanding of CCW direction and frequency   - minor pain noted with increased GHJ abd Seated Scapular Retraction   1 x 10  - tactile cue for full scap retraction  Seated Bicep Curls Supinated with Dumbbells   1 x 10, 4#   Isometric Shoulder External Rotation at Wall     1 x 10s   Seated Elbow Flexion with Resistance    1 x 5 Blue TB   1 x 5 Black TB  PATIENT EDUCATION:  Education details: POC, Prognosis Person educated: Patient Education method: Programmer, multimedia, Facilities manager, and Handouts Education comprehension: verbalized understanding and returned demonstration   HOME EXERCISE PROGRAM:  Access Code: [email or web address] URL: [email or web address] Date: 08/20/2023 Prepared by: Veryl Gottron Kaimani Clayson  Exercises - Circular Shoulder Pendulum with Table Support  - 2 x daily - 7 x weekly - 2-3 sets - 10 reps - Seated Scapular Retraction  - 1 x daily - 3-4 x weekly - 2-3 sets - 15 reps - 5 hold - Seated Bicep Curls Supinated with Dumbbells  - 1 x daily - 7 x weekly - 2-3 sets - 10 reps - Isometric Shoulder External Rotation at Wall  - 1 x daily - 3-4 x weekly - 2-3 sets - 6 reps - 10s hold - Seated Elbow Flexion with Resistance  - 1 x daily - 3-4 x weekly - 2-3 sets - 10-12 reps  ASSESSMENT:  CLINICAL IMPRESSION:  Gerad Cornelio is a pleasant 73 y.o. male who was seen today for physical therapy evaluation and treatment for s/p [facility] joint repair. Objective impairments significant for deficits in ROM, strength, activity tolerance and pain. AROM in R shoulder abd/flex deferred due to surgical precautions however AROM in L shoulder grossly intact. Concordant pain reported with PROM with R GHJ flexion to 90, abd to 30 and internal rotation.Palpation significant for tenderness along R upper neck secondary to residual pain from nerve block insertions; no notable trigger points throughout parascapular  musculature.  PT educated patient on self management techniques and ROM restrictions in order to allow optimized healing to surgical site. Initial HEP provided in order to maintain GHJ and elbow strength. PT POC with good prognosis as patient is highly motivated to return to active lifestyle. Based on today's performance, pt will continue to benefit from skilled PT in order to facilitate return to PLOF and improve QoL.   OBJECTIVE IMPAIRMENTS: decreased ROM, decreased strength, increased edema, and pain.   ACTIVITY LIMITATIONS: carrying, lifting, bathing, dressing, reach over head, and hygiene/grooming  PARTICIPATION LIMITATIONS: laundry, driving, yard work, and gym, swimming  PERSONAL FACTORS: Age are also affecting patient's functional outcome.   REHAB POTENTIAL: Good  CLINICAL DECISION MAKING: Stable/uncomplicated  EVALUATION COMPLEXITY: Low   GOALS: Goals reviewed with patient? Yes  SHORT TERM GOALS: Target date: 10/01/2023  Pt will be independent with HEP to improve strength and decrease shoulder pain to improve pain-free function at home and work. Baseline: 08/20/2023: Initial HEP provided. Goal status: INITIAL   LONG TERM GOALS: Target date: 11/12/2023  Pt will demonstrate R GHJ flexion/abd AROM increase 120 in  order to demonstrate improvements towards outward and overhead reaching ADLs.  Baseline: AROM restrictions based on surgical protocol  Goal status: INITIAL  2.  Pt will decrease worst shoulder pain by at least 3 points on the NPRS in order to demonstrate clinically significant reduction in shoulder pain. Baseline: 08/20/2023: 7/10 Goal status: INITIAL  3.  Pt will decrease quick DASH score by at least 8% in order to demonstrate clinically significant reduction in disability related to shoulder pain        Baseline: 08/20/2023: 31.8 / 100 = 31.8 % Goal status: INITIAL  4. Pt will increase R/L shoulder Flex/abd strength by at least 1/2 MMT grade in order to  demonstrate improvement in strength and overhead function. Baseline: 08/20/2023:  Right  Left  Flexion Surgical Limitations 4  Extension    Abduction Surgical Limitations 4  Goal status: INITIAL   PLAN: PT FREQUENCY: 1-2x/week  PT DURATION: 12 weeks  PLANNED INTERVENTIONS: Therapeutic exercises, Therapeutic activity, Neuromuscular re-education, Balance training, Gait training, Patient/Family education, Self Care, Joint mobilization, Joint manipulation, Needling, Electrical stimulation, Spinal manipulation, Spinal mobilization, Cryotherapy, Moist heat,  Manual therapy, and Re-evaluation.  PLAN FOR NEXT SESSION: Review HEP; GHJ ER/IR Isometric, elbow strengthening, R GHJ Passive ROM into abd/flex. Manual techniques prn  Satira Curet PT, DPT Physical Therapist- [place]  08/20/2023, 1:06 PM

## 2023-08-20 ENCOUNTER — Ambulatory Visit: Attending: Orthopaedic Surgery

## 2023-08-20 DIAGNOSIS — S4991XA Unspecified injury of right shoulder and upper arm, initial encounter: Secondary | ICD-10-CM | POA: Diagnosis not present

## 2023-08-20 DIAGNOSIS — M6281 Muscle weakness (generalized): Secondary | ICD-10-CM | POA: Insufficient documentation

## 2023-08-20 DIAGNOSIS — M25511 Pain in right shoulder: Secondary | ICD-10-CM | POA: Insufficient documentation

## 2023-08-20 DIAGNOSIS — M25611 Stiffness of right shoulder, not elsewhere classified: Secondary | ICD-10-CM | POA: Diagnosis not present

## 2023-08-26 ENCOUNTER — Telehealth: Payer: Self-pay

## 2023-08-26 ENCOUNTER — Ambulatory Visit

## 2023-08-26 NOTE — Telephone Encounter (Signed)
 Called patient about missed appointment. No answer but LVM regarding next following appointment. Advised of no show policy and to call for future rescheduling or concerns.

## 2023-08-27 ENCOUNTER — Ambulatory Visit (HOSPITAL_BASED_OUTPATIENT_CLINIC_OR_DEPARTMENT_OTHER): Admitting: Orthopaedic Surgery

## 2023-08-27 DIAGNOSIS — S4991XA Unspecified injury of right shoulder and upper arm, initial encounter: Secondary | ICD-10-CM

## 2023-08-27 NOTE — Progress Notes (Signed)
 Post Operative Evaluation    Procedure/Date of Surgery: Right shoulder AC repair 6/5  Interval History:   [place] 2 weeks status post the above procedure.  Overall he is doing extremely well.  He has begun active range of motion which is going nicely.  He has begun physical therapy   PMH/PSH/Family History/Social History/Meds/Allergies:    Past Medical History:  Diagnosis Date   Allergic rhinitis due to pollen    Angiodysplasia of cecum 04/03/2015   Benign essential tremor 03/10/1996   Colon polyps 03/10/2009   adenomatous   ED (erectile dysfunction)    Prostate cancer (HCC) 03/10/2008   total prostatectomy   Renal agenesis    Past Surgical History:  Procedure Laterality Date   COLONOSCOPY     KNEE ARTHROSCOPY Right 2012   ROBOT ASSISTED LAPAROSCOPIC RADICAL PROSTATECTOMY N/A 2010   [facility] urology    VASECTOMY  1977   Social History   Socioeconomic History   Marital status: Married    Spouse name: Not on file   Number of children: 2   Years of education: Not on file   Highest education level: Bachelor's degree (e.g., BA, AB, BS)  Occupational History   Occupation: Nurse, adult    Comment: Retired   Occupation: Nurse, learning disability    Comment: Retired  Tobacco Use   Smoking status: Former    Current packs/day: 0.00    Average packs/day: 1 pack/day for 30.0 years (30.0 ttl pk-yrs)    Types: Cigarettes    Start date: 02/01/1985    Quit date: 02/02/2015    Years since quitting: 8.5   Smokeless tobacco: Never  Vaping Use   Vaping status: Never Used  Substance and Sexual Activity   Alcohol use: No    Alcohol/week: 0.0 standard drinks of alcohol   Drug use: No   Sexual activity: Yes  Other Topics Concern   Not on file  Social History Narrative   Divorced long ago   Remarried in 2022      Has living will   wife Shelvy Dickens should make decisions--alternate is brother Erla Haw   Would accept resuscitation   Would accept  tube feeds short term--- but not if cognitively unaware   Social Drivers of Health   Financial Resource Strain: Low Risk  (05/31/2023)   Overall Financial Resource Strain (CARDIA)    Difficulty of Paying Living Expenses: Not hard at all  Food Insecurity: No Food Insecurity (05/31/2023)   Hunger Vital Sign    Worried About Running Out of Food in the Last Year: Never true    Ran Out of Food in the Last Year: Never true  Transportation Needs: No Transportation Needs (05/31/2023)   PRAPARE - Administrator, Civil Service (Medical): No    Lack of Transportation (Non-Medical): No  Physical Activity: Sufficiently Active (05/31/2023)   Exercise Vital Sign    Days of Exercise per Week: 5 days    Minutes of Exercise per Session: 60 min  Stress: No Stress Concern Present (05/31/2023)   [employer] of Occupational Health - Occupational Stress Questionnaire    Feeling of Stress : Only a little  Social Connections: Moderately Integrated (05/31/2023)   Social Connection and Isolation Panel    Frequency of Communication with Friends and Family: Once a week    Frequency  of Social Gatherings with Friends and Family: Once a week    Attends Religious Services: More than 4 times per year    Active Member of [employer] or Organizations: Yes    Attends Engineer, structural: 1 to 4 times per year    Marital Status: Married   Family History  Problem Relation Age of Onset   Cancer Mother    Heart disease Mother    Lymphoma Mother    Stroke Father    Hyperlipidemia Brother    Hypertension Brother    Atrial fibrillation Brother    Hyperlipidemia Brother    Hypertension Brother    Diabetes Brother    Hyperlipidemia Brother    Hypertension Brother    Colon cancer Neg Hx    Rectal cancer Neg Hx    Stomach cancer Neg Hx    Colon polyps Neg Hx    Allergies  Allergen Reactions   Clindamycin /Lincomycin Diarrhea    Got C diff   Penicillins Hives   Current Outpatient Medications   Medication Sig Dispense Refill   aspirin  EC 325 MG tablet Take 1 tablet (325 mg total) by mouth daily. 14 tablet 0   doxycycline  (VIBRA -TABS) 100 MG tablet Take 1 tablet (100 mg total) by mouth 2 (two) times daily. 14 tablet 2   oxyCODONE  (ROXICODONE ) 5 MG immediate release tablet Take 1 tablet (5 mg total) by mouth every 4 (four) hours as needed for severe pain (pain score 7-10) or breakthrough pain. 10 tablet 0   propranolol  ER (INDERAL  LA) 80 MG 24 hr capsule TAKE ONE CAPSULE (80 MG TOTAL) BY MOUTH DAILY. 90 capsule 3   sildenafil  (REVATIO ) 20 MG tablet TAKE THREE TO FIVE TABLETS BY MOUTH DAILY AS NEEDED (NEED OFFICE VISIT FOR REFILLS) 50 tablet 11   No current facility-administered medications for this visit.   No results found.  Review of Systems:   A ROS was performed including pertinent positives and negatives as documented in the HPI.   Musculoskeletal Exam:    There were no vitals taken for this visit.  Right shoulder incisions are well-appearing.  Active forward elevation is 90 degrees external rotation at side is 20 degrees  Imaging:      I personally reviewed and interpreted the radiographs.   Assessment:   Status post right shoulder AC joint repair doing extremely well.  He will continue work on active and active assistive range of motion.  I will plan to see him back in 4 weeks for reassessment  Plan :    - Return to clinic 4 weeks for reassessment      I personally saw and evaluated the patient, and participated in the management and treatment plan.  Wilhelmenia Harada, MD Attending Physician, Orthopedic Surgery  This document was dictated using Dragon voice recognition software. A reasonable attempt at proof reading has been made to minimize errors.

## 2023-09-01 ENCOUNTER — Ambulatory Visit

## 2023-09-01 DIAGNOSIS — M25511 Pain in right shoulder: Secondary | ICD-10-CM | POA: Diagnosis not present

## 2023-09-01 DIAGNOSIS — M25611 Stiffness of right shoulder, not elsewhere classified: Secondary | ICD-10-CM | POA: Diagnosis not present

## 2023-09-01 DIAGNOSIS — S4991XA Unspecified injury of right shoulder and upper arm, initial encounter: Secondary | ICD-10-CM | POA: Diagnosis not present

## 2023-09-01 DIAGNOSIS — M6281 Muscle weakness (generalized): Secondary | ICD-10-CM

## 2023-09-01 NOTE — Therapy (Signed)
 OUTPATIENT PHYSICAL THERAPY SHOULDER/ELBOW TREATMENT  Patient Name: Juan Holmes MRN: [id number] DOB:[birth year], 73 y.o., male Today's Date: 09/01/2023  END OF SESSION:  PT End of Session - 09/01/23 1642     Visit Number 2    Number of Visits 25    Date for PT Re-Evaluation 11/12/23    Authorization Type [employer] Medicare # [id number]    Authorization Time Period 6/12 - 8/7    Authorization - Visit Number 2    Authorization - Number of Visits 16    Progress Note Due on Visit 10    PT Start Time 1645    PT Stop Time 1725    PT Time Calculation (min) 40 min    Activity Tolerance Patient tolerated treatment well    Behavior During Therapy [facility] for tasks assessed/performed          Past Medical History:  Diagnosis Date   Allergic rhinitis due to pollen    Angiodysplasia of cecum 04/03/2015   Benign essential tremor 03/10/1996   Colon polyps 03/10/2009   adenomatous   ED (erectile dysfunction)    Prostate cancer (HCC) 03/10/2008   total prostatectomy   Renal agenesis    Past Surgical History:  Procedure Laterality Date   COLONOSCOPY     KNEE ARTHROSCOPY Right 2012   ROBOT ASSISTED LAPAROSCOPIC RADICAL PROSTATECTOMY N/A 2010   [facility] urology    VASECTOMY  1977   Patient Active Problem List   Diagnosis Date Noted   Paronychia of finger, left 07/27/2023   Essential hypertension, benign 06/01/2023   Rosacea 05/23/2022   Advance directive discussed with patient 11/30/2019   History of colonic polyps 04/03/2015   Angiodysplasia of cecum 04/03/2015   Hyperlipemia 08/11/2014   Alcohol dependence in remission (HCC)    Routine general medical examination at a health care facility 08/31/2012   Benign essential tremor    History of prostate cancer    Allergic rhinitis due to pollen    ED (erectile dysfunction)    Congenital single kidney     PCP: Jimmy Charlie FERNS, MD   REFERRING PROVIDER:  Genelle Standing, MD   REFERRING DIAG:  [phone] (ICD-10-CM) - Injury of right  acromioclavicular joint, initial encounter   RATIONALE FOR EVALUATION AND TREATMENT: Rehabilitation  THERAPY DIAG: Acute pain of right shoulder  Muscle weakness (generalized)  Stiffness of right shoulder, not elsewhere classified  ONSET DATE: s/p AC Joint Repair (08/13/2023)  FOLLOW-UP APPT SCHEDULED WITH REFERRING PROVIDER: Yes  08/27/2023.   SUBJECTIVE:  SUBJECTIVE STATEMENT:    Patient with chief concern of R Shoulder Pain s/p AC joint repair  PERTINENT HISTORY:   Juan Holmes is 73 y.o. male reporting to OPPT s/p AC joint repair via [facility] [place] procedure on 08/13/2023. Patient reports that he injured [facility] joint following a fall and landing on his shoulder. Patient reports that he removed sling since surgery and is able to perform some iADLs without difficuty (I.e.donning a shirt). He has difficulty with shaving using RUE. Pain aggravated with moving into outward reaching (GHJ abd) cross body motion (GHJ add). Pain relieved with ice modalities, rest and gentle range of motion when waking in the AM. Prior to the injury, patient was very active in the community and exercised frequently. Patient participating in swimming, golf, and gym program. Patient denied chills/fever, numbness/tingling, loss of sensation.   Imaging (Per Chart Review 08/20/2023):  CLINICAL DATA:  Right shoulder pain.   EXAM: MRI OF THE RIGHT SHOULDER WITHOUT CONTRAST   TECHNIQUE: Multiplanar, multisequence MR imaging of the shoulder was performed. No intravenous contrast was administered.   COMPARISON:  Right shoulder radiographs 07/14/2023   FINDINGS: Rotator cuff: There is a small focus of fluid bright signal indicating a tiny partial-thickness midsubstance insertional tear measuring up to 3 mm in transverse dimension (coronal  image 9) and 3 mm in AP dimension (sagittal image 18) within the mid to posterior aspect of the supraspinatus tendon footprint insertion. Mild intermediate T2 signal tendinosis of the mid to posterior supraspinatus. The infraspinatus, subscapularis, and teres minor are intact.   Muscles: No rotator cuff muscle atrophy, fatty infiltration, or edema.   Biceps long head: Mild-to-moderate intermediate T2 signal and thickening tendinosis of the proximal long head of the biceps tendon.   Acromioclavicular Joint: There is again approximate 8 mm elevation of the clavicular head with respect to the acromion. Small acromioclavicular joint effusion with adjacent and possibly connected fluid within the deep aspect of the superior subcutaneous fat. Mild to moderate peripheral clavicular head degenerative osteophytes. Type II acromion. No fluid within the subacromial/subdeltoid bursa.   Glenohumeral Joint: Moderate thinning of the posterior glenoid cartilage moderate posterior subchondral cystic change. Moderate inferior medial humeral head cartilage thinning. Subchondral cysts are also seen within the anterior superior medial humeral head. No glenohumeral joint effusion.   Labrum: There is fluid bright signal indicating a chondrolabral junction tear of the posterior mid to superior glenoid (axial images 11 through 14).   Bones:  No acute fracture.   Other: None.   IMPRESSION: 1. Tiny partial-thickness midsubstance insertional tear of the mid to posterior supraspinatus tendon footprint insertion measuring only 3 mm in transverse dimension and 3 mm in AP dimension. Mild underlying mid to posterior supraspinatus tendinosis. 2. Mild-to-moderate proximal long head of the biceps tendinosis. 3. Moderate glenohumeral cartilage degenerative changes. 4. Chondrolabral junction tear of the posterior mid to superior glenoid. 5. Unchanged elevation of the clavicular head with respect to  the acromion. Small acromioclavicular joint effusion with adjacent and possibly connected fluid within the deep aspect of the superior subcutaneous fat. This is consistent with a type II [place] classification acromioclavicular joint injury.   Electronically Signed   By: Tanda Lyons M.D.   On: 07/25/2023 18:08   PAIN:  Pain Intensity: Present: 0/10, Best: 0/10, Worst: 7/10 Pain location: R Shoulder  Pain Quality: intermittent and stabbing  Radiating: No  Numbness/Tingling: No Focal Weakness: Yes Aggravating factors: moving the Arm, outward reaching  Relieving factors: Resting, Ice 24-hour pain behavior: AM: soreness  and stiffness   History of prior shoulder or neck/shoulder injury, pain, surgery, or therapy: Yes Dominant hand: right  PRECAUTIONS: None  WEIGHT BEARING RESTRICTIONS: No  FALLS: Has patient fallen in last 6 months? Yes. Number of falls 1 Patent reports fall forward up stairs   Living Environment Lives with: lives with their spouse Lives in: House/apartment Stairs: Yes: Internal: 13 steps; can reach both Has following equipment at home: None  Prior level of function: Independent  Occupational demands: Retired   Hobbies: Golf   Patient Goals: Pt states he would like to get back to the gym/swim as fast as possible.  OBJECTIVE:   Patient Surveys  QuickDASH:   Cognition Patient is oriented to person, place, and time.  Recent memory is intact.  Remote memory is intact.  Attention span and concentration are intact.  Expressive speech is intact.  Patient's fund of knowledge is within normal limits for educational level.    Gross Musculoskeletal Assessment Tremor: None Bulk: Normal Tone: Normal  Gait Within Normal Limits   Posture Seated: Rounded Shoulder   Cervical Screen AROM: WFL and painless with overpressure in all planes  AROM AROM (Normal range in degrees) AROM/PROM  Cervical  Flexion (50)   Extension (80)   Right lateral  flexion (45)   Left lateral flexion (45)   Right rotation (85)   Left rotation (85)    Right Left  Shoulder    Flexion Surgical Limitations/90 180  Extension Surgical Limitations   Abduction Surgical Limitations/90 150  External Rotation 60 60  Internal Rotation WNL* WNL  Hands Behind Head    Hands Behind Back        Elbow    Flexion    Extension    Pronation    Supination    (* = pain; Blank rows = not tested)  UE MMT: MMT (out of 5) Right Left   Cervical (isometric)  Flexion WNL  Extension WNL  Lateral Flexion WNL WNL  Rotation WNL WNL      Shoulder   Flexion Surgical Limitations 4  Extension    Abduction Surgical Limitations 4  External rotation 4 4  Internal rotation 4 4  Horizontal abduction    Horizontal adduction    Lower Trapezius    Rhomboids        Elbow  Flexion 5 5  Extension 4 4  Pronation 5 5  Supination 5 5      Wrist  Flexion    Extension    Radial deviation    Ulnar deviation        MCP  Flexion    Extension    Abduction    Adduction    (* = pain; Blank rows = not tested)  Sensation Grossly intact to light touch bilateral UE as determined by testing dermatomes C2-T2. Proprioception and hot/cold testing deferred on this date.  Reflexes Deferred  Palpation Location LEFT  RIGHT           Subocciptials    Cervical paraspinals  0  Upper Trapezius  1  Levator Scapulae  1  Rhomboid Major/Minor    Sternoclavicular joint    Acromioclavicular joint    Coracoid process    Long head of biceps  0  Supraspinatus  0  Infraspinatus  0  Subscapularis    Teres Minor  0  Teres Major    Pectoralis Major  0  Pectoralis Minor  0  Anterior Deltoid  0  Lateral Deltoid  0  Posterior Deltoid    Latissimus Dorsi    Sternocleidomastoid    (Blank rows = not tested) Graded on 0-4 scale (0 = no pain, 1 = pain, 2 = pain with wincing/grimacing/flinching, 3 = pain with withdrawal, 4 = unwilling to allow palpation), (Blank rows = not  tested)   Passive Accessory Intervertebral Motion Deferred  Accessory Motions/Glides Deferred   TODAY'S TREATMENT: DATE:09/01/2023  Subjective: Patient reports stiffness in the AM but following PT interventions the stiffness improvements. He continues to perform ADLs but maintains protocol of protecting surgical site and refraining from lifting heavy items. Current 0/10 seated at start of the session. No further questions or concerns.     Therapeutic Exercise:   Supine Assisted R Shoulder Flexion to 90  1 x 15 reps PT assisted  1 x 15 reps Self assisted using LUE   Supine Assisted R Shoulder ER  1 x 15 reps PT assisted 1 x 15 reps PT assisted using yard stick   Isometric Shoulder IR   6 x 10s, PT resistance to distal wrist  Isometric Shoulder ER   6 x 10s, PT resistance at distal wrist   Seated Elbow Flexion, Neutral Grip   R: 3  x 10, Black TB   Standing Elbow Push Down  3 x 10, Black TB   Seated Pronation/Supination against hammer   1 x 10 ea dir   1 x 10, with 3# AW attached at distal end   Reviewed HEP and modified program to include additional AAROM exercises with use of dowel/yard stick in order to improve ROM/stiffness.   PATIENT EDUCATION:  Education details: POC, Prognosis, HEP  Person educated: Patient Education method: Explanation, Demonstration, and Handouts Education comprehension: verbalized understanding and returned demonstration   HOME EXERCISE PROGRAM:  Access Code: [email or web address] URL: [email or web address] Date: 09/01/2023 Prepared by: Lonni Pall  Exercises - Circular Shoulder Pendulum with Table Support  - 2 x daily - 7 x weekly - 2-3 sets - 10 reps - Seated Scapular Retraction  - 1 x daily - 3-4 x weekly - 2-3 sets - 15 reps - 5 hold - Standing Bicep Curls Neutral with Dumbbells  - 1 x daily - 3-4 x weekly - 2-3 sets - 10-12 reps - Isometric Shoulder External Rotation at Wall  - 1 x daily - 3-4 x weekly - 2-3 sets - 6 reps  - 10s hold - Seated Elbow Flexion with Resistance  - 1 x daily - 3-4 x weekly - 2-3 sets - 10-12 reps - Supine Shoulder External Rotation with Dowel at 20 Degrees of Abduction  - 2 x daily - 7 x weekly - 2-3 sets - 10-15 reps - Supine Shoulder Flexion Overhead with Dowel  - 2 x daily - 7 x weekly - 2-3 sets - 10-15 reps  Access Code: [email or web address] URL: [email or web address] Date: 08/20/2023 Prepared by: Lonni Crew Goren  Exercises - Circular Shoulder Pendulum with Table Support  - 2 x daily - 7 x weekly - 2-3 sets - 10 reps - Seated Scapular Retraction  - 1 x daily - 3-4 x weekly - 2-3 sets - 15 reps - 5 hold - Seated Bicep Curls Supinated with Dumbbells  - 1 x daily - 7 x weekly - 2-3 sets - 10 reps - Isometric Shoulder External Rotation at Wall  - 1 x daily - 3-4 x weekly - 2-3 sets - 6 reps - 10s hold - Seated Elbow Flexion with Resistance  - 1 x daily - 3-4 x weekly -  2-3 sets - 10-12 reps  ASSESSMENT:  CLINICAL IMPRESSION: Patient presenting to OPPT 4 weeks s/p post op R AC joint repair for initial follow up PT session. Patient remains compliant with post-operative precautions, including avoidance of heavy lifting and adherence to protected ROM. He tolerated today's session well, demonstrating appropriate engagement with AAROM activities, isometrics, and light resistance exercises for elbow and forearm musculature.  Updated HEP to include dowel-assisted ROM is appropriate to address ongoing stiffness and promote mobility gains in order to gradually progress into next protocol phase. Currently patient still has deficits in GHJ ROM, strength and positional pain through ER. Based on today's performance the patient will continue to benefit from skilled PT in order to address current deficits and maximize return to PLOF.   OBJECTIVE IMPAIRMENTS: decreased ROM, decreased strength, increased edema, and pain.   ACTIVITY LIMITATIONS: carrying, lifting, bathing, dressing, reach over head,  and hygiene/grooming  PARTICIPATION LIMITATIONS: laundry, driving, yard work, and gym, swimming  PERSONAL FACTORS: Age are also affecting patient's functional outcome.   REHAB POTENTIAL: Good  CLINICAL DECISION MAKING: Stable/uncomplicated  EVALUATION COMPLEXITY: Low   GOALS: Goals reviewed with patient? Yes  SHORT TERM GOALS: Target date: 10/13/2023  Pt will be independent with HEP to improve strength and decrease shoulder pain to improve pain-free function at home and work. Baseline: 08/20/2023: Initial HEP provided. Goal status: INITIAL   LONG TERM GOALS: Target date: 11/24/2023  Pt will demonstrate R GHJ flexion/abd AROM increase 120 in order to demonstrate improvements towards outward and overhead reaching ADLs.  Baseline: AROM restrictions based on surgical protocol  Goal status: INITIAL  2.  Pt will decrease worst shoulder pain by at least 3 points on the NPRS in order to demonstrate clinically significant reduction in shoulder pain. Baseline: 08/20/2023: 7/10 Goal status: INITIAL  3.  Pt will decrease quick DASH score by at least 8% in order to demonstrate clinically significant reduction in disability related to shoulder pain        Baseline: 08/20/2023: 31.8 / 100 = 31.8 % Goal status: INITIAL  4. Pt will increase R/L shoulder Flex/abd strength by at least 1/2 MMT grade in order to demonstrate improvement in strength and overhead function. Baseline: 08/20/2023:  Right  Left  Flexion Surgical Limitations 4  Extension    Abduction Surgical Limitations 4  Goal status: INITIAL   PLAN: PT FREQUENCY: 1-2x/week  PT DURATION: 12 weeks  PLANNED INTERVENTIONS: Therapeutic exercises, Therapeutic activity, Neuromuscular re-education, Balance training, Gait training, Patient/Family education, Self Care, Joint mobilization, Joint manipulation, Needling, Electrical stimulation, Spinal manipulation, Spinal mobilization, Cryotherapy, Moist heat,  Manual therapy, and  Re-evaluation.  PLAN FOR NEXT SESSION: Review HEP; GHJ ER/IR Isometric, elbow strengthening, R GHJ Passive ROM into abd/flex. Manual techniques prn  Lonni Pall PT, DPT Physical Therapist- [place]  09/01/2023, 4:44 PM

## 2023-09-04 ENCOUNTER — Encounter: Payer: Self-pay | Admitting: Internal Medicine

## 2023-09-04 ENCOUNTER — Ambulatory Visit
Admission: EM | Admit: 2023-09-04 | Discharge: 2023-09-04 | Disposition: A | Discharge status: Home / Self Care | Attending: Physician Assistant | Admitting: Physician Assistant

## 2023-09-04 DIAGNOSIS — W5501XA Bitten by cat, initial encounter: Secondary | ICD-10-CM

## 2023-09-04 DIAGNOSIS — S91051A Open bite, right ankle, initial encounter: Secondary | ICD-10-CM | POA: Diagnosis not present

## 2023-09-04 DIAGNOSIS — L03115 Cellulitis of right lower limb: Secondary | ICD-10-CM

## 2023-09-04 MED ORDER — METRONIDAZOLE 500 MG PO TABS
500.0000 mg | ORAL_TABLET | Freq: Two times a day (BID) | ORAL | 0 refills | Status: AC
Start: 2023-09-04 — End: ?

## 2023-09-04 MED ORDER — MUPIROCIN 2 % EX OINT
1.0000 | TOPICAL_OINTMENT | Freq: Every day | CUTANEOUS | 0 refills | Status: AC
Start: 2023-09-04 — End: ?

## 2023-09-04 MED ORDER — SULFAMETHOXAZOLE-TRIMETHOPRIM 800-160 MG PO TABS: 1.0000 | ORAL_TABLET | Freq: Two times a day (BID) | ORAL | 0 refills | Status: AC

## 2023-09-04 NOTE — Discharge Instructions (Addendum)
 You are up-to-date on your tetanus.  As we discussed, we are going to postpone starting rabies prophylaxis in the hopes that you can get a hold of the cat and have it monitored with animal control.  If you have any difficulty getting a hold of the cat please return within the next 1 to 2 days so we can start this vaccine series.  We are treating for an infection.  Start Bactrim DS and metronidazole.  Do not drink any alcohol while on this medication for 3 days after completing course of medication as will cause you to vomit.  If you develop any rash or lesions stop the medication to be seen immediately.  Keep the area clean and apply Bactroban ointment with dressing changes.  If you have any spread of redness, fever, nausea, vomiting you need to be seen immediately.

## 2023-09-04 NOTE — ED Triage Notes (Addendum)
 Patient to Urgent Care with complaints of multiple feral cat scratches and bites present to right ankle.  Incident occurred Wednesday morning. Reports he cleaned the area well but woke up this morning with increased redness and warmth to his ankle.   TDAP 09/18/20.

## 2023-09-04 NOTE — ED Provider Notes (Signed)
 [name]    CSN: [id number] Arrival date & time: 09/04/23  9188      History   Chief Complaint Chief Complaint  Patient presents with   Wound Check    HPI Deklen Popelka is a 73 y.o. male.   Patient presents today with redness and swelling of his right medial ankle following injury.  Reports that several days ago (09/02/2023) he was feeding some of the neighborhood cats when one of them scratched and bit his ankle.  He has been cleaning it with Betadine but has noticed some redness and pain.  He reports that the pain is rated 1/2 on a 0-10 pain scale, described as aching, no aggravating or alleviating factors identified.  He denies any recent antibiotics.  He denies history of MRSA but has had C. difficile related to clindamycin .  He denies any fever, nausea, vomiting.  He is up-to-date on tetanus which was last given 09/18/2020.  He does not know the vaccine status of this cat but does believe that he is able to capture it and monitor it for signs of rabies.    Past Medical History:  Diagnosis Date   Allergic rhinitis due to pollen    Angiodysplasia of cecum 04/03/2015   Benign essential tremor 03/10/1996   Colon polyps 03/10/2009   adenomatous   ED (erectile dysfunction)    Prostate cancer (HCC) 03/10/2008   total prostatectomy   Renal agenesis     Patient Active Problem List   Diagnosis Date Noted   Paronychia of finger, left 07/27/2023   Essential hypertension, benign 06/01/2023   Rosacea 05/23/2022   Advance directive discussed with patient 11/30/2019   History of colonic polyps 04/03/2015   Angiodysplasia of cecum 04/03/2015   Hyperlipemia 08/11/2014   Alcohol dependence in remission [facility])    Routine general medical examination at a health care facility 08/31/2012   Benign essential tremor    History of prostate cancer    Allergic rhinitis due to pollen    ED (erectile dysfunction)    Congenital single kidney     Past Surgical History:  Procedure  Laterality Date   COLONOSCOPY     KNEE ARTHROSCOPY Right 2012   ROBOT ASSISTED LAPAROSCOPIC RADICAL PROSTATECTOMY N/A 2010   [facility] urology    VASECTOMY  1977       Home Medications    Prior to Admission medications   Medication Sig Start Date End Date Taking? Authorizing Provider  metroNIDAZOLE (FLAGYL) 500 MG tablet Take 1 tablet (500 mg total) by mouth 2 (two) times daily. 09/04/23  Yes Angeles Zehner K, PA-C  mupirocin ointment (BACTROBAN) 2 % Apply 1 Application topically daily. 09/04/23  Yes Leathia Farnell K, PA-C  sulfamethoxazole-trimethoprim (BACTRIM DS) 800-160 MG tablet Take 1 tablet by mouth 2 (two) times daily for 10 days. 09/04/23 09/14/23 Yes Helga Asbury K, PA-C  propranolol  ER (INDERAL  LA) 80 MG 24 hr capsule TAKE ONE CAPSULE (80 MG TOTAL) BY MOUTH DAILY. 06/22/23   Jimmy Charlie FERNS, MD  sildenafil  (REVATIO ) 20 MG tablet TAKE THREE TO FIVE TABLETS BY MOUTH DAILY AS NEEDED (NEED OFFICE VISIT FOR REFILLS) 09/22/22   Jimmy Charlie FERNS, MD    Family History Family History  Problem Relation Age of Onset   Cancer Mother    Heart disease Mother    Lymphoma Mother    Stroke Father    Hyperlipidemia Brother    Hypertension Brother    Atrial fibrillation Brother    Hyperlipidemia Brother  Hypertension Brother    Diabetes Brother    Hyperlipidemia Brother    Hypertension Brother    Colon cancer Neg Hx    Rectal cancer Neg Hx    Stomach cancer Neg Hx    Colon polyps Neg Hx     Social History Social History   Tobacco Use   Smoking status: Former    Current packs/day: 0.00    Average packs/day: 1 pack/day for 30.0 years (30.0 ttl pk-yrs)    Types: Cigarettes    Start date: 02/01/1985    Quit date: 02/02/2015    Years since quitting: 8.5   Smokeless tobacco: Never  Vaping Use   Vaping status: Never Used  Substance Use Topics   Alcohol use: No    Alcohol/week: 0.0 standard drinks of alcohol   Drug use: No     Allergies   Clindamycin /lincomycin and  Penicillins   Review of Systems Review of Systems  Constitutional:  Positive for activity change. Negative for appetite change, fatigue and fever.  Gastrointestinal:  Negative for abdominal pain, diarrhea, nausea and vomiting.  Skin:  Positive for color change and wound.  Neurological:  Negative for weakness and numbness.     Physical Exam Triage Vital Signs ED Triage Vitals  Encounter Vitals Group     BP 09/04/23 0825 135/76     Girls Systolic BP Percentile --      Girls Diastolic BP Percentile --      Boys Systolic BP Percentile --      Boys Diastolic BP Percentile --      Pulse Rate 09/04/23 0825 60     Resp 09/04/23 0825 18     Temp 09/04/23 0825 99 F (37.2 C)     Temp src --      SpO2 09/04/23 0825 97 %     Weight --      Height --      Head Circumference --      Peak Flow --      Pain Score 09/04/23 0838 1     Pain Loc --      Pain Education --      Exclude from Growth Chart --    No data found.  Updated Vital Signs BP 135/76   Pulse 60   Temp 99 F (37.2 C)   Resp 18   SpO2 97%   Visual Acuity Right Eye Distance:   Left Eye Distance:   Bilateral Distance:    Right Eye Near:   Left Eye Near:    Bilateral Near:     Physical Exam Vitals reviewed.  Constitutional:      General: He is awake.     Appearance: Normal appearance. He is well-developed. He is not ill-appearing.     Comments: Very pleasant male appears stated age in no acute distress sitting comfortably in exam room  HENT:     Head: Normocephalic and atraumatic.     Mouth/Throat:     Pharynx: No oropharyngeal exudate, posterior oropharyngeal erythema or uvula swelling.   Cardiovascular:     Rate and Rhythm: Normal rate and regular rhythm.     Heart sounds: Normal heart sounds, S1 normal and S2 normal. No murmur heard.    Comments: Refill within 2 seconds right toes Pulmonary:     Effort: Pulmonary effort is normal.     Breath sounds: Normal breath sounds. No stridor. No wheezing,  rhonchi or rales.     Comments: Clear to auscultation bilaterally Feet:  Comments: Right foot neurovascularly intact.  Skin:    Findings: Erythema and wound present.     Comments: Several puncture wounds and abrasions noted medial right ankle with surrounding erythema.  Area is warm to touch.  No active bleeding or drainage.  No streaking or evidence of lymphangitis.   Neurological:     Mental Status: He is alert.   Psychiatric:        Behavior: Behavior is cooperative.      UC Treatments / Results  Labs (all labs ordered are listed, but only abnormal results are displayed) Labs Reviewed - No data to display  EKG   Radiology No results found.  Procedures Procedures (including critical care time)  Medications Ordered in UC Medications - No data to display  Initial Impression / Assessment and Plan / UC Course  I have reviewed the triage vital signs and the nursing notes.  Pertinent labs & imaging results that were available during my care of the patient were reviewed by me and considered in my medical decision making (see chart for details).     Patient is well-appearing, afebrile, nontoxic, nontachycardic.  He does have a history of penicillin allergy noted as hives.  Will treat with Bactrim DS and metronidazole to cover for anaerobes given associated cat bite.  He was encouraged to avoid alcohol while on this medication and for 3 days after completing course due to associated Antabuse side effects.  We discussed that he is to stop the medication to be seen immediately if he develops any rash or oral lesions.  We did discuss that all antibiotics carry the risk of C. difficile and if he has significant diarrhea he should stop the medication and be seen so we can consider treatment.  He was encouraged to keep the area clean and apply Bactroban ointment with dressing changes.  [place] animal bite form was completed during visit today.  We discussed that if he has any  difficulty accessing the animal for animal control services he should return so we can start rabies postexposure prophylaxis immediately.  If he cannot capture the animal within 1 to 2 days he needs to return for reevaluation.  We discussed that if he has any worsening or changing symptoms including spread of erythema, abnormal drainage, fever, nausea, vomiting, increasing pain recently seen emergently.  Strict return precautions given.  All questions were answered to patient satisfaction.  Final Clinical Impressions(s) / UC Diagnoses   Final diagnoses:  Cellulitis of right lower leg  Cat bite of right ankle, initial encounter     Discharge Instructions      You are up-to-date on your tetanus.  As we discussed, we are going to postpone starting rabies prophylaxis in the hopes that you can get a hold of the cat and have it monitored with animal control.  If you have any difficulty getting a hold of the cat please return within the next 1 to 2 days so we can start this vaccine series.  We are treating for an infection.  Start Bactrim DS and metronidazole.  Do not drink any alcohol while on this medication for 3 days after completing course of medication as will cause you to vomit.  If you develop any rash or lesions stop the medication to be seen immediately.  Keep the area clean and apply Bactroban ointment with dressing changes.  If you have any spread of redness, fever, nausea, vomiting you need to be seen immediately.     ED Prescriptions  Medication Sig Dispense Auth. Provider   sulfamethoxazole-trimethoprim (BACTRIM DS) 800-160 MG tablet Take 1 tablet by mouth 2 (two) times daily for 10 days. 20 tablet Shamel Germond K, PA-C   metroNIDAZOLE (FLAGYL) 500 MG tablet Take 1 tablet (500 mg total) by mouth 2 (two) times daily. 20 tablet Meha Vidrine K, PA-C   mupirocin ointment (BACTROBAN) 2 % Apply 1 Application topically daily. 22 g Jamar Weatherall K, PA-C      PDMP not reviewed this  encounter.   Sherrell Rocky POUR, PA-C 09/04/23 [id number]

## 2023-09-07 ENCOUNTER — Ambulatory Visit

## 2023-09-07 ENCOUNTER — Ambulatory Visit
Admission: EM | Admit: 2023-09-07 | Discharge: 2023-09-07 | Disposition: A | Discharge status: Home / Self Care | Attending: Emergency Medicine | Admitting: Emergency Medicine

## 2023-09-07 ENCOUNTER — Encounter: Payer: Self-pay | Admitting: Emergency Medicine

## 2023-09-07 DIAGNOSIS — M6281 Muscle weakness (generalized): Secondary | ICD-10-CM

## 2023-09-07 DIAGNOSIS — Z2914 Encounter for prophylactic rabies immune globin: Secondary | ICD-10-CM

## 2023-09-07 DIAGNOSIS — M25511 Pain in right shoulder: Secondary | ICD-10-CM | POA: Diagnosis not present

## 2023-09-07 DIAGNOSIS — S91051D Open bite, right ankle, subsequent encounter: Secondary | ICD-10-CM | POA: Diagnosis not present

## 2023-09-07 DIAGNOSIS — W5501XD Bitten by cat, subsequent encounter: Secondary | ICD-10-CM

## 2023-09-07 DIAGNOSIS — Z23 Encounter for immunization: Secondary | ICD-10-CM

## 2023-09-07 DIAGNOSIS — S4991XA Unspecified injury of right shoulder and upper arm, initial encounter: Secondary | ICD-10-CM | POA: Diagnosis not present

## 2023-09-07 DIAGNOSIS — M25611 Stiffness of right shoulder, not elsewhere classified: Secondary | ICD-10-CM | POA: Diagnosis not present

## 2023-09-07 MED ORDER — RABIES VACCINE, PCEC IM SUSR
1.0000 mL | Freq: Once | INTRAMUSCULAR | Status: AC
  Administered 2023-09-07: 1 mL via INTRAMUSCULAR

## 2023-09-07 MED ORDER — RABIES IMMUNE GLOBULIN 1500 UNIT/10ML IJ SOLN
20.0000 [IU]/kg | Freq: Once | INTRAMUSCULAR | Status: AC
  Administered 2023-09-07: 1500 [IU]

## 2023-09-07 NOTE — ED Triage Notes (Signed)
 Patient her for rabies vaccine. Was seen on 09/04/23 for multiple feral cat scratches and bite to right ankle.

## 2023-09-07 NOTE — Therapy (Signed)
 OUTPATIENT PHYSICAL THERAPY SHOULDER/ELBOW TREATMENT  Patient Name: Juan Holmes MRN: [id number] DOB:1950-07-04, 73 y.o., male Today's Date: 09/07/2023  END OF SESSION:  PT End of Session - 09/07/23 1600     Visit Number 3    Number of Visits 25    Date for PT Re-Evaluation 11/12/23    Authorization Type [employer] Medicare # [id number]    Authorization Time Period 6/12 - 8/7    Authorization - Visit Number 3    Authorization - Number of Visits 16    Progress Note Due on Visit 10    PT Start Time 1600    PT Stop Time 1640    PT Time Calculation (min) 40 min    Activity Tolerance Patient tolerated treatment well    Behavior During Therapy [facility] for tasks assessed/performed          Past Medical History:  Diagnosis Date   Allergic rhinitis due to pollen    Angiodysplasia of cecum 04/03/2015   Benign essential tremor 03/10/1996   Colon polyps 03/10/2009   adenomatous   ED (erectile dysfunction)    Prostate cancer (HCC) 03/10/2008   total prostatectomy   Renal agenesis    Past Surgical History:  Procedure Laterality Date   COLONOSCOPY     KNEE ARTHROSCOPY Right 2012   ROBOT ASSISTED LAPAROSCOPIC RADICAL PROSTATECTOMY N/A 2010   [facility] urology    VASECTOMY  1977   Patient Active Problem List   Diagnosis Date Noted   Paronychia of finger, left 07/27/2023   Essential hypertension, benign 06/01/2023   Rosacea 05/23/2022   Advance directive discussed with patient 11/30/2019   History of colonic polyps 04/03/2015   Angiodysplasia of cecum 04/03/2015   Hyperlipemia 08/11/2014   Alcohol dependence in remission (HCC)    Routine general medical examination at a health care facility 08/31/2012   Benign essential tremor    History of prostate cancer    Allergic rhinitis due to pollen    ED (erectile dysfunction)    Congenital single kidney     PCP: Jimmy Charlie FERNS, MD   REFERRING PROVIDER:  Genelle Standing, MD   REFERRING DIAG:  [phone] (ICD-10-CM) - Injury of right  acromioclavicular joint, initial encounter   RATIONALE FOR EVALUATION AND TREATMENT: Rehabilitation  THERAPY DIAG: Acute pain of right shoulder  Muscle weakness (generalized)  Stiffness of right shoulder, not elsewhere classified  ONSET DATE: s/p AC Joint Repair (08/13/2023)  FOLLOW-UP APPT SCHEDULED WITH REFERRING PROVIDER: Yes  08/27/2023.   SUBJECTIVE:  SUBJECTIVE STATEMENT:    Patient with chief concern of R Shoulder Pain s/p AC joint repair  PERTINENT HISTORY:   Juan Holmes is 73 y.o. male reporting to OPPT s/p AC joint repair via [facility] [employer] procedure on 08/13/2023. Patient reports that he injured [facility] joint following a fall and landing on his shoulder. Patient reports that he removed sling since surgery and is able to perform some iADLs without difficuty (I.e.donning a shirt). He has difficulty with shaving using RUE. Pain aggravated with moving into outward reaching (GHJ abd) cross body motion (GHJ add). Pain relieved with ice modalities, rest and gentle range of motion when waking in the AM. Prior to the injury, patient was very active in the community and exercised frequently. Patient participating in swimming, golf, and gym program. Patient denied chills/fever, numbness/tingling, loss of sensation.   Imaging (Per Chart Review 08/20/2023):  CLINICAL DATA:  Right shoulder pain.   EXAM: MRI OF THE RIGHT SHOULDER WITHOUT CONTRAST   TECHNIQUE: Multiplanar, multisequence MR imaging of the shoulder was performed. No intravenous contrast was administered.   COMPARISON:  Right shoulder radiographs 07/14/2023   FINDINGS: Rotator cuff: There is a small focus of fluid bright signal indicating a tiny partial-thickness midsubstance insertional tear measuring up to 3 mm in transverse dimension (coronal  image 9) and 3 mm in AP dimension (sagittal image 18) within the mid to posterior aspect of the supraspinatus tendon footprint insertion. Mild intermediate T2 signal tendinosis of the mid to posterior supraspinatus. The infraspinatus, subscapularis, and teres minor are intact.   Muscles: No rotator cuff muscle atrophy, fatty infiltration, or edema.   Biceps long head: Mild-to-moderate intermediate T2 signal and thickening tendinosis of the proximal long head of the biceps tendon.   Acromioclavicular Joint: There is again approximate 8 mm elevation of the clavicular head with respect to the acromion. Small acromioclavicular joint effusion with adjacent and possibly connected fluid within the deep aspect of the superior subcutaneous fat. Mild to moderate peripheral clavicular head degenerative osteophytes. Type II acromion. No fluid within the subacromial/subdeltoid bursa.   Glenohumeral Joint: Moderate thinning of the posterior glenoid cartilage moderate posterior subchondral cystic change. Moderate inferior medial humeral head cartilage thinning. Subchondral cysts are also seen within the anterior superior medial humeral head. No glenohumeral joint effusion.   Labrum: There is fluid bright signal indicating a chondrolabral junction tear of the posterior mid to superior glenoid (axial images 11 through 14).   Bones:  No acute fracture.   Other: None.   IMPRESSION: 1. Tiny partial-thickness midsubstance insertional tear of the mid to posterior supraspinatus tendon footprint insertion measuring only 3 mm in transverse dimension and 3 mm in AP dimension. Mild underlying mid to posterior supraspinatus tendinosis. 2. Mild-to-moderate proximal long head of the biceps tendinosis. 3. Moderate glenohumeral cartilage degenerative changes. 4. Chondrolabral junction tear of the posterior mid to superior glenoid. 5. Unchanged elevation of the clavicular head with respect to  the acromion. Small acromioclavicular joint effusion with adjacent and possibly connected fluid within the deep aspect of the superior subcutaneous fat. This is consistent with a type II [place] classification acromioclavicular joint injury.   Electronically Signed   By: Tanda Lyons M.D.   On: 07/25/2023 18:08   PAIN:  Pain Intensity: Present: 0/10, Best: 0/10, Worst: 7/10 Pain location: R Shoulder  Pain Quality: intermittent and stabbing  Radiating: No  Numbness/Tingling: No Focal Weakness: Yes Aggravating factors: moving the Arm, outward reaching  Relieving factors: Resting, Ice 24-hour pain behavior: AM: soreness  and stiffness   History of prior shoulder or neck/shoulder injury, pain, surgery, or therapy: Yes Dominant hand: right  PRECAUTIONS: None  WEIGHT BEARING RESTRICTIONS: No  FALLS: Has patient fallen in last 6 months? Yes. Number of falls 1 Patent reports fall forward up stairs   Living Environment Lives with: lives with their spouse Lives in: House/apartment Stairs: Yes: Internal: 13 steps; can reach both Has following equipment at home: None  Prior level of function: Independent  Occupational demands: Retired   Hobbies: Golf   Patient Goals: Pt states he would like to get back to the gym/swim as fast as possible.  OBJECTIVE:   Patient Surveys  QuickDASH:   Cognition Patient is oriented to person, place, and time.  Recent memory is intact.  Remote memory is intact.  Attention span and concentration are intact.  Expressive speech is intact.  Patient's fund of knowledge is within normal limits for educational level.    Gross Musculoskeletal Assessment Tremor: None Bulk: Normal Tone: Normal  Gait Within Normal Limits   Posture Seated: Rounded Shoulder   Cervical Screen AROM: WFL and painless with overpressure in all planes  AROM AROM (Normal range in degrees) AROM/PROM  Cervical  Flexion (50)   Extension (80)   Right lateral  flexion (45)   Left lateral flexion (45)   Right rotation (85)   Left rotation (85)    Right Left  Shoulder    Flexion Surgical Limitations/90 180  Extension Surgical Limitations   Abduction Surgical Limitations/90 150  External Rotation 60 60  Internal Rotation WNL* WNL  Hands Behind Head    Hands Behind Back        Elbow    Flexion    Extension    Pronation    Supination    (* = pain; Blank rows = not tested)  UE MMT: MMT (out of 5) Right Left   Cervical (isometric)  Flexion WNL  Extension WNL  Lateral Flexion WNL WNL  Rotation WNL WNL      Shoulder   Flexion Surgical Limitations 4  Extension    Abduction Surgical Limitations 4  External rotation 4 4  Internal rotation 4 4  Horizontal abduction    Horizontal adduction    Lower Trapezius    Rhomboids        Elbow  Flexion 5 5  Extension 4 4  Pronation 5 5  Supination 5 5      Wrist  Flexion    Extension    Radial deviation    Ulnar deviation        MCP  Flexion    Extension    Abduction    Adduction    (* = pain; Blank rows = not tested)  Sensation Grossly intact to light touch bilateral UE as determined by testing dermatomes C2-T2. Proprioception and hot/cold testing deferred on this date.  Reflexes Deferred  Palpation Location LEFT  RIGHT           Subocciptials    Cervical paraspinals  0  Upper Trapezius  1  Levator Scapulae  1  Rhomboid Major/Minor    Sternoclavicular joint    Acromioclavicular joint    Coracoid process    Long head of biceps  0  Supraspinatus  0  Infraspinatus  0  Subscapularis    Teres Minor  0  Teres Major    Pectoralis Major  0  Pectoralis Minor  0  Anterior Deltoid  0  Lateral Deltoid  0  Posterior Deltoid    Latissimus Dorsi    Sternocleidomastoid    (Blank rows = not tested) Graded on 0-4 scale (0 = no pain, 1 = pain, 2 = pain with wincing/grimacing/flinching, 3 = pain with withdrawal, 4 = unwilling to allow palpation), (Blank rows = not  tested)   Passive Accessory Intervertebral Motion Deferred  Accessory Motions/Glides Deferred   TODAY'S TREATMENT: DATE:09/07/2023  Subjective: Patient reports 0/10 NPS in the R shoulder. Patient reports having to get rabies shots due to cat bite this past weekend. No adverse to R shoulder since last session. He continues to perform HEP and finding improved AAROM in the R GHJ without any pain. No further questions or concerns.    Therapeutic Exercise:   Supine Assisted R Shoulder Flexion to 110  3 x 10 reps PT assisted  Supine Assisted R Shoulder ER  3 x 10 reps PT Assisted   Supine Isometric Shoulder IR at 40 Abd   6 x 10s, PT resistance to distal wrist  Supine Isometric Shoulder ER at 40 abd  6 x 10s, PT resistance at distal wrist   Seated Scapular Retractions  1 x 10 x 3s hold  1 x 10 x 3s hold against PT resistance (applied at distal elbows)  Standing Shoulder Row  2 x 12, Blue TB   1 x 12, Black TB   Seated Elbow Flexion, Neutral Grip   R/L: 2  x 10, Black TB   Standing Elbow Push Down  1 x 10 x 1s, Black TB   2 x 12 x Black TB   Therapeutic Activity:  Standing Self AAROM Shoulder flexion within tolerance   2 x 10  - Able to achieve 130 without pain  Seated Pulley Passive ROM:   R Shoulder Flexion to 100 - 1 x 10 reps  R Shoulder Abd to 90 - 1 x 10 reps  PATIENT EDUCATION:  Education details: Exercise, HEP  Person educated: Patient Education method: Explanation, Demonstration, and Handouts Education comprehension: verbalized understanding and returned demonstration   HOME EXERCISE PROGRAM:  Access Code: [email or web address] URL: [email or web address] Date: 09/01/2023 Prepared by: Lonni Pall  Exercises - Circular Shoulder Pendulum with Table Support  - 2 x daily - 7 x weekly - 2-3 sets - 10 reps - Seated Scapular Retraction  - 1 x daily - 3-4 x weekly - 2-3 sets - 15 reps - 5 hold - Standing Bicep Curls Neutral with Dumbbells  - 1 x  daily - 3-4 x weekly - 2-3 sets - 10-12 reps - Isometric Shoulder External Rotation at Wall  - 1 x daily - 3-4 x weekly - 2-3 sets - 6 reps - 10s hold - Seated Elbow Flexion with Resistance  - 1 x daily - 3-4 x weekly - 2-3 sets - 10-12 reps - Supine Shoulder External Rotation with Dowel at 20 Degrees of Abduction  - 2 x daily - 7 x weekly - 2-3 sets - 10-15 reps - Supine Shoulder Flexion Overhead with Dowel  - 2 x daily - 7 x weekly - 2-3 sets - 10-15 reps  Access Code: [email or web address] URL: [email or web address] Date: 08/20/2023 Prepared by: Lonni Pall  Exercises - Circular Shoulder Pendulum with Table Support  - 2 x daily - 7 x weekly - 2-3 sets - 10 reps - Seated Scapular Retraction  - 1 x daily - 3-4 x weekly - 2-3 sets - 15 reps - 5 hold - Seated Bicep Curls Supinated with Dumbbells  - 1 x  daily - 7 x weekly - 2-3 sets - 10 reps - Isometric Shoulder External Rotation at Wall  - 1 x daily - 3-4 x weekly - 2-3 sets - 6 reps - 10s hold - Seated Elbow Flexion with Resistance  - 1 x daily - 3-4 x weekly - 2-3 sets - 10-12 reps  ASSESSMENT:  CLINICAL IMPRESSION: Continued PT POC focused on improving RUE ROM and parascapular strengthening. Pt demonstrated improvements with assisted ROM and PROM; tolerated up to 130 of shoulder flexion without reported of sharp pain. PT provided pulley for patient to perform PROM and AAROM R GHJ abduction and flexion. Able to perform shoulder row against resistance and no pain in the R shoulder. PT educated patient on proper frequency/schedule of HEP. Currently patient still has deficits in GHJ ROM, strength and positional pain through ER. Based on today's performance the patient will continue to benefit from skilled PT in order to address current deficits and maximize return to PLOF.   OBJECTIVE IMPAIRMENTS: decreased ROM, decreased strength, increased edema, and pain.   ACTIVITY LIMITATIONS: carrying, lifting, bathing, dressing, reach over head,  and hygiene/grooming  PARTICIPATION LIMITATIONS: laundry, driving, yard work, and gym, swimming  PERSONAL FACTORS: Age are also affecting patient's functional outcome.   REHAB POTENTIAL: Good  CLINICAL DECISION MAKING: Stable/uncomplicated  EVALUATION COMPLEXITY: Low   GOALS: Goals reviewed with patient? Yes  SHORT TERM GOALS: Target date: 10/19/2023  Pt will be independent with HEP to improve strength and decrease shoulder pain to improve pain-free function at home and work. Baseline: 08/20/2023: Initial HEP provided. Goal status: INITIAL   LONG TERM GOALS: Target date: 11/30/2023  Pt will demonstrate R GHJ flexion/abd AROM increase 120 in order to demonstrate improvements towards outward and overhead reaching ADLs.  Baseline: AROM restrictions based on surgical protocol  Goal status: INITIAL  2.  Pt will decrease worst shoulder pain by at least 3 points on the NPRS in order to demonstrate clinically significant reduction in shoulder pain. Baseline: 08/20/2023: 7/10 Goal status: INITIAL  3.  Pt will decrease quick DASH score by at least 8% in order to demonstrate clinically significant reduction in disability related to shoulder pain        Baseline: 08/20/2023: 31.8 / 100 = 31.8 % Goal status: INITIAL  4. Pt will increase R/L shoulder Flex/abd strength by at least 1/2 MMT grade in order to demonstrate improvement in strength and overhead function. Baseline: 08/20/2023:  Right  Left  Flexion Surgical Limitations 4  Extension    Abduction Surgical Limitations 4  Goal status: INITIAL   PLAN: PT FREQUENCY: 1-2x/week  PT DURATION: 12 weeks  PLANNED INTERVENTIONS: Therapeutic exercises, Therapeutic activity, Neuromuscular re-education, Balance training, Gait training, Patient/Family education, Self Care, Joint mobilization, Joint manipulation, Needling, Electrical stimulation, Spinal manipulation, Spinal mobilization, Cryotherapy, Moist heat,  Manual therapy, and  Re-evaluation.  PLAN FOR NEXT SESSION: Review HEP; GHJ ER/IR Isometric, elbow strengthening, R GHJ Passive ROM into abd/flex. Manual techniques prn  Lonni Pall PT, DPT Physical Therapist- [place]  09/07/2023, 4:01 PM

## 2023-09-07 NOTE — ED Provider Notes (Signed)
 Juan Holmes    CSN: [id number] Arrival date & time: 09/07/23  0931      History   Chief Complaint Chief Complaint  Patient presents with   Animal Bite    HPI Juan Holmes is a 73 y.o. male.   Patient presents to initiate rabies series, was bitten by a stray cat which she feeds frequently 5 days ago, was evaluated in this urgent care 3 days ago due to concerns of infection at the bite and scratch Ozzy.  Started on antibiotics and endorses improvement, still taking.  Unable to locate cat for quarantine.  Past Medical History:  Diagnosis Date   Allergic rhinitis due to pollen    Angiodysplasia of cecum 04/03/2015   Benign essential tremor 03/10/1996   Colon polyps 03/10/2009   adenomatous   ED (erectile dysfunction)    Prostate cancer (HCC) 03/10/2008   total prostatectomy   Renal agenesis     Patient Active Problem List   Diagnosis Date Noted   Paronychia of finger, left 07/27/2023   Essential hypertension, benign 06/01/2023   Rosacea 05/23/2022   Advance directive discussed with patient 11/30/2019   History of colonic polyps 04/03/2015   Angiodysplasia of cecum 04/03/2015   Hyperlipemia 08/11/2014   Alcohol dependence in remission [facility])    Routine general medical examination at a health care facility 08/31/2012   Benign essential tremor    History of prostate cancer    Allergic rhinitis due to pollen    ED (erectile dysfunction)    Congenital single kidney     Past Surgical History:  Procedure Laterality Date   COLONOSCOPY     KNEE ARTHROSCOPY Right 2012   ROBOT ASSISTED LAPAROSCOPIC RADICAL PROSTATECTOMY N/A 2010   [facility] urology    VASECTOMY  1977       Home Medications    Prior to Admission medications   Medication Sig Start Date End Date Taking? Authorizing Provider  metroNIDAZOLE (FLAGYL) 500 MG tablet Take 1 tablet (500 mg total) by mouth 2 (two) times daily. 09/04/23   Raspet, Erin K, PA-C  mupirocin ointment (BACTROBAN) 2 %  Apply 1 Application topically daily. 09/04/23   Raspet, Erin K, PA-C  propranolol  ER (INDERAL  LA) 80 MG 24 hr capsule TAKE ONE CAPSULE (80 MG TOTAL) BY MOUTH DAILY. 06/22/23   Jimmy Charlie FERNS, MD  sildenafil  (REVATIO ) 20 MG tablet TAKE THREE TO FIVE TABLETS BY MOUTH DAILY AS NEEDED (NEED OFFICE VISIT FOR REFILLS) 09/22/22   Jimmy Charlie FERNS, MD  sulfamethoxazole-trimethoprim (BACTRIM DS) 800-160 MG tablet Take 1 tablet by mouth 2 (two) times daily for 10 days. 09/04/23 09/14/23  Raspet, Rocky POUR, PA-C    Family History Family History  Problem Relation Age of Onset   Cancer Mother    Heart disease Mother    Lymphoma Mother    Stroke Father    Hyperlipidemia Brother    Hypertension Brother    Atrial fibrillation Brother    Hyperlipidemia Brother    Hypertension Brother    Diabetes Brother    Hyperlipidemia Brother    Hypertension Brother    Colon cancer Neg Hx    Rectal cancer Neg Hx    Stomach cancer Neg Hx    Colon polyps Neg Hx     Social History Social History   Tobacco Use   Smoking status: Former    Current packs/day: 0.00    Average packs/day: 1 pack/day for 30.0 years (30.0 ttl pk-yrs)    Types: Cigarettes  Start date: 02/01/1985    Quit date: 02/02/2015    Years since quitting: 8.6   Smokeless tobacco: Never  Vaping Use   Vaping status: Never Used  Substance Use Topics   Alcohol use: No    Alcohol/week: 0.0 standard drinks of alcohol   Drug use: No     Allergies   Clindamycin /lincomycin and Penicillins   Review of Systems Review of Systems   Physical Exam Triage Vital Signs ED Triage Vitals  Encounter Vitals Group     BP 09/07/23 0943 121/77     Girls Systolic BP Percentile --      Girls Diastolic BP Percentile --      Boys Systolic BP Percentile --      Boys Diastolic BP Percentile --      Pulse Rate 09/07/23 0943 77     Resp 09/07/23 0943 18     Temp 09/07/23 0943 98.7 F (37.1 C)     Temp Source 09/07/23 0943 Oral     SpO2 09/07/23 0943 97  %     Weight 09/07/23 0939 180 lb 3.2 oz (81.7 kg)     Height --      Head Circumference --      Peak Flow --      Pain Score 09/07/23 0941 0     Pain Loc --      Pain Education --      Exclude from Growth Chart --    No data found.  Updated Vital Signs BP 121/77 (BP Location: Left Arm)   Pulse 77   Temp 98.7 F (37.1 C) (Oral)   Resp 18   Wt 180 lb 3.2 oz (81.7 kg)   SpO2 97%   BMI 24.44 kg/m   Visual Acuity Right Eye Distance:   Left Eye Distance:   Bilateral Distance:    Right Eye Near:   Left Eye Near:    Bilateral Near:     Physical Exam Constitutional:      Appearance: Normal appearance.   Eyes:     Extraocular Movements: Extraocular movements intact.   Pulmonary:     Effort: Pulmonary effort is normal.   Skin:    Comments:  Several puncture wounds and abrasions noted medial right ankle with surrounding erythema.  No active bleeding or drainage.  No streaking or evidence of lymphangitis.    Neurological:     Mental Status: He is alert and oriented to person, place, and time. Mental status is at baseline.      UC Treatments / Results  Labs (all labs ordered are listed, but only abnormal results are displayed) Labs Reviewed - No data to display  EKG   Radiology No results found.  Procedures Procedures (including critical care time)  Medications Ordered in UC Medications  Rabies Immune Globulin SOLN 1,500 Units (1,500 Units Infiltration Incomplete 09/07/23 0947)  rabies vaccine (RABAVERT) injection 1 mL (has no administration in time range)    Initial Impression / Assessment and Plan / UC Course  I have reviewed the triage vital signs and the nursing notes.  Pertinent labs & imaging results that were available during my care of the patient were reviewed by me and considered in my medical decision making (see chart for details).  Encounter for prophylactic administration of rabies immunoglobulin, cat bite of right ankle  Rabies vaccine  series initiated, given vaccine schedule, last tetanus 2022, advised to continue to monitor Final Clinical Impressions(s) / UC Diagnoses   Final diagnoses:  Encounter for prophylactic administration of rabies immune globulin  Cat bite of right ankle, subsequent encounter     Discharge Instructions      Today you have been started on the rabies series you have been given a dose of immunoglobulin and in the rabies vaccine  You will not need tetanus as last documented was 2022  Please return to the clinic on the following dates below for remainder series  Day 3-Thursday, July 3 Day 7-Monday, July 7 Day 14-Monday, July 14     ED Prescriptions   None    PDMP not reviewed this encounter.   Teresa Shelba SAUNDERS, NP 09/07/23 1028

## 2023-09-07 NOTE — Discharge Instructions (Signed)
 Today you have been started on the rabies series you have been given a dose of immunoglobulin and in the rabies vaccine  You will not need tetanus as last documented was 2022  Please return to the clinic on the following dates below for remainder series  Day 3-Thursday, July 3 Day 7-Monday, July 7 Day 14-Monday, July 14

## 2023-09-10 ENCOUNTER — Ambulatory Visit
Admission: RE | Admit: 2023-09-10 | Discharge: 2023-09-10 | Disposition: A | Discharge status: Home / Self Care | Source: Ambulatory Visit | Attending: Emergency Medicine | Admitting: Emergency Medicine

## 2023-09-10 DIAGNOSIS — Z23 Encounter for immunization: Secondary | ICD-10-CM

## 2023-09-10 DIAGNOSIS — Z203 Contact with and (suspected) exposure to rabies: Secondary | ICD-10-CM | POA: Diagnosis not present

## 2023-09-10 MED ORDER — RABIES VACCINE, PCEC IM SUSR
1.0000 mL | Freq: Once | INTRAMUSCULAR | Status: AC
  Administered 2023-09-10: 1 mL via INTRAMUSCULAR

## 2023-09-10 NOTE — ED Triage Notes (Signed)
 Pt is here for 3rd rabies shot. No reactions to past vaccinees.

## 2023-09-14 ENCOUNTER — Ambulatory Visit: Attending: Orthopaedic Surgery

## 2023-09-14 DIAGNOSIS — M25511 Pain in right shoulder: Secondary | ICD-10-CM | POA: Diagnosis not present

## 2023-09-14 DIAGNOSIS — M25611 Stiffness of right shoulder, not elsewhere classified: Secondary | ICD-10-CM | POA: Diagnosis not present

## 2023-09-14 DIAGNOSIS — M6281 Muscle weakness (generalized): Secondary | ICD-10-CM | POA: Insufficient documentation

## 2023-09-14 NOTE — Therapy (Signed)
 OUTPATIENT PHYSICAL THERAPY SHOULDER/ELBOW TREATMENT  Patient Name: Juan Holmes MRN: [id number] DOB:1950-09-26, 73 y.o., male Today's Date: 09/14/2023  END OF SESSION:  PT End of Session - 09/14/23 1118     Visit Number 4    Number of Visits 25    Date for PT Re-Evaluation 11/12/23    Authorization Type [employer] Medicare # [id number]    Authorization Time Period 6/12 - 8/7    Authorization - Visit Number 4    Authorization - Number of Visits 16    Progress Note Due on Visit 10    PT Start Time 1117    PT Stop Time 1158    PT Time Calculation (min) 41 min    Activity Tolerance Patient tolerated treatment well    Behavior During Therapy [facility] for tasks assessed/performed          Past Medical History:  Diagnosis Date   Allergic rhinitis due to pollen    Angiodysplasia of cecum 04/03/2015   Benign essential tremor 03/10/1996   Colon polyps 03/10/2009   adenomatous   ED (erectile dysfunction)    Prostate cancer (HCC) 03/10/2008   total prostatectomy   Renal agenesis    Past Surgical History:  Procedure Laterality Date   COLONOSCOPY     KNEE ARTHROSCOPY Right 2012   ROBOT ASSISTED LAPAROSCOPIC RADICAL PROSTATECTOMY N/A 2010   [facility] urology    VASECTOMY  1977   Patient Active Problem List   Diagnosis Date Noted   Paronychia of finger, left 07/27/2023   Essential hypertension, benign 06/01/2023   Rosacea 05/23/2022   Advance directive discussed with patient 11/30/2019   History of colonic polyps 04/03/2015   Angiodysplasia of cecum 04/03/2015   Hyperlipemia 08/11/2014   Alcohol dependence in remission (HCC)    Routine general medical examination at a health care facility 08/31/2012   Benign essential tremor    History of prostate cancer    Allergic rhinitis due to pollen    ED (erectile dysfunction)    Congenital single kidney     PCP: Jimmy Charlie FERNS, MD   REFERRING PROVIDER:  Genelle Standing, MD   REFERRING DIAG:  [phone] (ICD-10-CM) - Injury of right  acromioclavicular joint, initial encounter   RATIONALE FOR EVALUATION AND TREATMENT: Rehabilitation  THERAPY DIAG: Acute pain of right shoulder  Muscle weakness (generalized)  Stiffness of right shoulder, not elsewhere classified  ONSET DATE: s/p AC Joint Repair (08/13/2023)  FOLLOW-UP APPT SCHEDULED WITH REFERRING PROVIDER: Yes  08/27/2023.   SUBJECTIVE:  SUBJECTIVE STATEMENT:    Patient with chief concern of R Shoulder Pain s/p AC joint repair  PERTINENT HISTORY:   Juan Holmes is 73 y.o. male reporting to OPPT s/p AC joint repair via [place] procedure on 08/13/2023. Patient reports that he injured [facility] joint following a fall and landing on his shoulder. Patient reports that he removed sling since surgery and is able to perform some iADLs without difficuty (I.e.donning a shirt). He has difficulty with shaving using RUE. Pain aggravated with moving into outward reaching (GHJ abd) cross body motion (GHJ add). Pain relieved with ice modalities, rest and gentle range of motion when waking in the AM. Prior to the injury, patient was very active in the community and exercised frequently. Patient participating in swimming, golf, and gym program. Patient denied chills/fever, numbness/tingling, loss of sensation.   Imaging (Per Chart Review 08/20/2023):  CLINICAL DATA:  Right shoulder pain.   EXAM: MRI OF THE RIGHT SHOULDER WITHOUT CONTRAST   TECHNIQUE: Multiplanar, multisequence MR imaging of the shoulder was performed. No intravenous contrast was administered.   COMPARISON:  Right shoulder radiographs 07/14/2023   FINDINGS: Rotator cuff: There is a small focus of fluid bright signal indicating a tiny partial-thickness midsubstance insertional tear measuring up to 3 mm in transverse dimension (coronal  image 9) and 3 mm in AP dimension (sagittal image 18) within the mid to posterior aspect of the supraspinatus tendon footprint insertion. Mild intermediate T2 signal tendinosis of the mid to posterior supraspinatus. The infraspinatus, subscapularis, and teres minor are intact.   Muscles: No rotator cuff muscle atrophy, fatty infiltration, or edema.   Biceps long head: Mild-to-moderate intermediate T2 signal and thickening tendinosis of the proximal long head of the biceps tendon.   Acromioclavicular Joint: There is again approximate 8 mm elevation of the clavicular head with respect to the acromion. Small acromioclavicular joint effusion with adjacent and possibly connected fluid within the deep aspect of the superior subcutaneous fat. Mild to moderate peripheral clavicular head degenerative osteophytes. Type II acromion. No fluid within the subacromial/subdeltoid bursa.   Glenohumeral Joint: Moderate thinning of the posterior glenoid cartilage moderate posterior subchondral cystic change. Moderate inferior medial humeral head cartilage thinning. Subchondral cysts are also seen within the anterior superior medial humeral head. No glenohumeral joint effusion.   Labrum: There is fluid bright signal indicating a chondrolabral junction tear of the posterior mid to superior glenoid (axial images 11 through 14).   Bones:  No acute fracture.   Other: None.   IMPRESSION: 1. Tiny partial-thickness midsubstance insertional tear of the mid to posterior supraspinatus tendon footprint insertion measuring only 3 mm in transverse dimension and 3 mm in AP dimension. Mild underlying mid to posterior supraspinatus tendinosis. 2. Mild-to-moderate proximal long head of the biceps tendinosis. 3. Moderate glenohumeral cartilage degenerative changes. 4. Chondrolabral junction tear of the posterior mid to superior glenoid. 5. Unchanged elevation of the clavicular head with respect to  the acromion. Small acromioclavicular joint effusion with adjacent and possibly connected fluid within the deep aspect of the superior subcutaneous fat. This is consistent with a type II [place] classification acromioclavicular joint injury.   Electronically Signed   By: Tanda Lyons M.D.   On: 07/25/2023 18:08   PAIN:  Pain Intensity: Present: 0/10, Best: 0/10, Worst: 7/10 Pain location: R Shoulder  Pain Quality: intermittent and stabbing  Radiating: No  Numbness/Tingling: No Focal Weakness: Yes Aggravating factors: moving the Arm, outward reaching  Relieving factors: Resting, Ice 24-hour pain behavior: AM: soreness  and stiffness   History of prior shoulder or neck/shoulder injury, pain, surgery, or therapy: Yes Dominant hand: right  PRECAUTIONS: None  WEIGHT BEARING RESTRICTIONS: No  FALLS: Has patient fallen in last 6 months? Yes. Number of falls 1 Patent reports fall forward up stairs   Living Environment Lives with: lives with their spouse Lives in: House/apartment Stairs: Yes: Internal: 13 steps; can reach both Has following equipment at home: None  Prior level of function: Independent  Occupational demands: Retired   Hobbies: Golf   Patient Goals: Pt states he would like to get back to the gym/swim as fast as possible.  OBJECTIVE:   Patient Surveys  QuickDASH:   Cognition Patient is oriented to person, place, and time.  Recent memory is intact.  Remote memory is intact.  Attention span and concentration are intact.  Expressive speech is intact.  Patient's fund of knowledge is within normal limits for educational level.    Gross Musculoskeletal Assessment Tremor: None Bulk: Normal Tone: Normal  Gait Within Normal Limits   Posture Seated: Rounded Shoulder   Cervical Screen AROM: WFL and painless with overpressure in all planes  AROM AROM (Normal range in degrees) AROM/PROM  Cervical  Flexion (50)   Extension (80)   Right lateral  flexion (45)   Left lateral flexion (45)   Right rotation (85)   Left rotation (85)    Right Left  Shoulder    Flexion Surgical Limitations/90 180  Extension Surgical Limitations   Abduction Surgical Limitations/90 150  External Rotation 60 60  Internal Rotation WNL* WNL  Hands Behind Head    Hands Behind Back        Elbow    Flexion    Extension    Pronation    Supination    (* = pain; Blank rows = not tested)  UE MMT: MMT (out of 5) Right Left   Cervical (isometric)  Flexion WNL  Extension WNL  Lateral Flexion WNL WNL  Rotation WNL WNL      Shoulder   Flexion Surgical Limitations 4  Extension    Abduction Surgical Limitations 4  External rotation 4 4  Internal rotation 4 4  Horizontal abduction    Horizontal adduction    Lower Trapezius    Rhomboids        Elbow  Flexion 5 5  Extension 4 4  Pronation 5 5  Supination 5 5      Wrist  Flexion    Extension    Radial deviation    Ulnar deviation        MCP  Flexion    Extension    Abduction    Adduction    (* = pain; Blank rows = not tested)  Sensation Grossly intact to light touch bilateral UE as determined by testing dermatomes C2-T2. Proprioception and hot/cold testing deferred on this date.  Reflexes Deferred  Palpation Location LEFT  RIGHT           Subocciptials    Cervical paraspinals  0  Upper Trapezius  1  Levator Scapulae  1  Rhomboid Major/Minor    Sternoclavicular joint    Acromioclavicular joint    Coracoid process    Long head of biceps  0  Supraspinatus  0  Infraspinatus  0  Subscapularis    Teres Minor  0  Teres Major    Pectoralis Major  0  Pectoralis Minor  0  Anterior Deltoid  0  Lateral Deltoid  0  Posterior Deltoid    Latissimus Dorsi    Sternocleidomastoid    (Blank rows = not tested) Graded on 0-4 scale (0 = no pain, 1 = pain, 2 = pain with wincing/grimacing/flinching, 3 = pain with withdrawal, 4 = unwilling to allow palpation), (Blank rows = not  tested)   Passive Accessory Intervertebral Motion Deferred  Accessory Motions/Glides Deferred   TODAY'S TREATMENT: DATE:09/14/2023  Subjective: Patient reports 0/10 NPS in the R shoulder. Getting up this morning was a 7/10 but improved with AAROM using yard stick. Patient reports sharp pain in the shoulder 4/10 following the last session.  No further questions or concerns.    Therapeutic Exercise:   Supine R Shoulder Flexion to 110  2 x 15, AAROM  1 x 10, AROM - Minor pain between 20-50 GHJ Flexion   Supine R Shoulder ER (to 50 ER)  1 x 10, PT assisted AAROM  2 x 10, AROM  Supine Isometric Shoulder ER   6 x 10s, PT resistance at distal wrist (Neutral GHJ Abd)   6 x 10s, at 40 Abd  Seated Scapular Retractions  2 x 10 x 5s hold, Grey TB   Standing Shoulder Row  1 x 12 x 3s hold , Blue TB   2 x 12 x 3s hold , Black TB   Prone T AROM  2 x 10, L Only  Time spent on educating patient on proper form with Dowel assisted Shoulder Flexion. PT advised patient to refrain from abduction during flexion into 110.  PATIENT EDUCATION:  Education details: Exercise, HEP  Person educated: Patient Education method: Explanation, Demonstration, and Handouts Education comprehension: verbalized understanding and returned demonstration   HOME EXERCISE PROGRAM:  Access Code: [email or web address] URL: [email or web address] Date: 09/01/2023 Prepared by: Lonni Pall  Exercises - Circular Shoulder Pendulum with Table Support  - 2 x daily - 7 x weekly - 2-3 sets - 10 reps - Seated Scapular Retraction  - 1 x daily - 3-4 x weekly - 2-3 sets - 15 reps - 5 hold - Standing Bicep Curls Neutral with Dumbbells  - 1 x daily - 3-4 x weekly - 2-3 sets - 10-12 reps - Isometric Shoulder External Rotation at Wall  - 1 x daily - 3-4 x weekly - 2-3 sets - 6 reps - 10s hold - Seated Elbow Flexion with Resistance  - 1 x daily - 3-4 x weekly - 2-3 sets - 10-12 reps - Supine Shoulder External  Rotation with Dowel at 20 Degrees of Abduction  - 2 x daily - 7 x weekly - 2-3 sets - 10-15 reps - Supine Shoulder Flexion Overhead with Dowel  - 2 x daily - 7 x weekly - 2-3 sets - 10-15 reps  Access Code: [email or web address] URL: [email or web address] Date: 08/20/2023 Prepared by: Lonni Brown Dunlap  Exercises - Circular Shoulder Pendulum with Table Support  - 2 x daily - 7 x weekly - 2-3 sets - 10 reps - Seated Scapular Retraction  - 1 x daily - 3-4 x weekly - 2-3 sets - 15 reps - 5 hold - Seated Bicep Curls Supinated with Dumbbells  - 1 x daily - 7 x weekly - 2-3 sets - 10 reps - Isometric Shoulder External Rotation at Wall  - 1 x daily - 3-4 x weekly - 2-3 sets - 6 reps - 10s hold - Seated Elbow Flexion with Resistance  - 1 x daily - 3-4 x weekly - 2-3 sets - 10-12 reps  ASSESSMENT:  CLINICAL IMPRESSION: Continued  PT POC focused on improving RUE ROM and parascapular strengthening. PT focused on AAROM in GHJ flexion or external rotation within tolerance. Clicking sensation endorsed throughout ranges but without significant pain. Pt still presenting with minor pain within 20-50 of R shoulder flexion. Pt demonstrated tolerance to parascapular strengthening without exacerbation of pain around [facility] joint. PT to continue progressing patient from [facility] to AROM within tolerance; will monitor progress with scapular and elbow strengthening. Currently patient still has deficits in GHJ ROM, strength and positional pain through ER. Based on today's performance the patient will continue to benefit from skilled PT in order to address current deficits and maximize return to PLOF.   OBJECTIVE IMPAIRMENTS: decreased ROM, decreased strength, increased edema, and pain.   ACTIVITY LIMITATIONS: carrying, lifting, bathing, dressing, reach over head, and hygiene/grooming  PARTICIPATION LIMITATIONS: laundry, driving, yard work, and gym, swimming  PERSONAL FACTORS: Age are also affecting patient's functional  outcome.   REHAB POTENTIAL: Good  CLINICAL DECISION MAKING: Stable/uncomplicated  EVALUATION COMPLEXITY: Low   GOALS: Goals reviewed with patient? Yes  SHORT TERM GOALS: Target date: 10/26/2023  Pt will be independent with HEP to improve strength and decrease shoulder pain to improve pain-free function at home and work. Baseline: 08/20/2023: Initial HEP provided. Goal status: INITIAL   LONG TERM GOALS: Target date: 12/07/2023  Pt will demonstrate R GHJ flexion/abd AROM increase 120 in order to demonstrate improvements towards outward and overhead reaching ADLs.  Baseline: AROM restrictions based on surgical protocol  Goal status: INITIAL  2.  Pt will decrease worst shoulder pain by at least 3 points on the NPRS in order to demonstrate clinically significant reduction in shoulder pain. Baseline: 08/20/2023: 7/10 Goal status: INITIAL  3.  Pt will decrease quick DASH score by at least 8% in order to demonstrate clinically significant reduction in disability related to shoulder pain        Baseline: 08/20/2023: 31.8 / 100 = 31.8 % Goal status: INITIAL  4. Pt will increase R/L shoulder Flex/abd strength by at least 1/2 MMT grade in order to demonstrate improvement in strength and overhead function. Baseline: 08/20/2023:  Right  Left  Flexion Surgical Limitations 4  Extension    Abduction Surgical Limitations 4  Goal status: INITIAL   PLAN: PT FREQUENCY: 1-2x/week  PT DURATION: 12 weeks  PLANNED INTERVENTIONS: Therapeutic exercises, Therapeutic activity, Neuromuscular re-education, Balance training, Gait training, Patient/Family education, Self Care, Joint mobilization, Joint manipulation, Needling, Electrical stimulation, Spinal manipulation, Spinal mobilization, Cryotherapy, Moist heat,  Manual therapy, and Re-evaluation.  PLAN FOR NEXT SESSION: Review HEP; GHJ ER/IR Isometric, elbow strengthening, R GHJ Passive ROM into abd/flex. Manual techniques prn  Lonni Pall  PT, DPT Physical Therapist- [place]  09/14/2023, 12:24 PM

## 2023-09-16 ENCOUNTER — Ambulatory Visit

## 2023-09-16 DIAGNOSIS — M25611 Stiffness of right shoulder, not elsewhere classified: Secondary | ICD-10-CM

## 2023-09-16 DIAGNOSIS — M6281 Muscle weakness (generalized): Secondary | ICD-10-CM | POA: Diagnosis not present

## 2023-09-16 DIAGNOSIS — M25511 Pain in right shoulder: Secondary | ICD-10-CM | POA: Diagnosis not present

## 2023-09-16 NOTE — Therapy (Signed)
 OUTPATIENT PHYSICAL THERAPY SHOULDER/ELBOW TREATMENT  Patient Name: Juan Holmes MRN: [id number] DOB:1950-04-22, 73 y.o., male Today's Date: 09/16/2023  END OF SESSION:  PT End of Session - 09/16/23 1118     Visit Number 5    Number of Visits 25    Date for PT Re-Evaluation 11/12/23    Authorization Type [employer] Medicare # [id number]    Authorization Time Period 6/12 - 8/7    Authorization - Visit Number 5    Authorization - Number of Visits 16    Progress Note Due on Visit 10    PT Start Time 1117    PT Stop Time 1200    PT Time Calculation (min) 43 min    Activity Tolerance Patient tolerated treatment well    Behavior During Therapy [facility] for tasks assessed/performed          Past Medical History:  Diagnosis Date   Allergic rhinitis due to pollen    Angiodysplasia of cecum 04/03/2015   Benign essential tremor 03/10/1996   Colon polyps 03/10/2009   adenomatous   ED (erectile dysfunction)    Prostate cancer (HCC) 03/10/2008   total prostatectomy   Renal agenesis    Past Surgical History:  Procedure Laterality Date   COLONOSCOPY     KNEE ARTHROSCOPY Right 2012   ROBOT ASSISTED LAPAROSCOPIC RADICAL PROSTATECTOMY N/A 2010   [facility]rology    VASECTOMY  1977   Patient Active Problem List   Diagnosis Date Noted   Paronychia of finger, left 07/27/2023   Essential hypertension, benign 06/01/2023   Rosacea 05/23/2022   Advance directive discussed with patient 11/30/2019   History of colonic polyps 04/03/2015   Angiodysplasia of cecum 04/03/2015   Hyperlipemia 08/11/2014   Alcohol dependence in remission (HCC)    Routine general medical examination at a health care facility 08/31/2012   Benign essential tremor    History of prostate cancer    Allergic rhinitis due to pollen    ED (erectile dysfunction)    Congenital single kidney     PCP: Jimmy Charlie FERNS, MD   REFERRING PROVIDER:  Genelle Standing, MD   REFERRING DIAG:  [phone] (ICD-10-CM) - Injury of right  acromioclavicular joint, initial encounter   RATIONALE FOR EVALUATION AND TREATMENT: Rehabilitation  THERAPY DIAG: Acute pain of right shoulder  Muscle weakness (generalized)  Stiffness of right shoulder, not elsewhere classified  ONSET DATE: s/p AC Joint Repair (08/13/2023)  FOLLOW-UP APPT SCHEDULED WITH REFERRING PROVIDER: Yes  08/27/2023.   SUBJECTIVE:  SUBJECTIVE STATEMENT:    Patient with chief concern of R Shoulder Pain s/p AC joint repair  PERTINENT HISTORY:   Juan Holmes is 73 y.o. male reporting to OPPT s/p AC joint repair via [place] procedure on 08/13/2023. Patient reports that he injured [facility] joint following a fall and landing on his shoulder. Patient reports that he removed sling since surgery and is able to perform some iADLs without difficuty (I.e.donning a shirt). He has difficulty with shaving using RUE. Pain aggravated with moving into outward reaching (GHJ abd) cross body motion (GHJ add). Pain relieved with ice modalities, rest and gentle range of motion when waking in the AM. Prior to the injury, patient was very active in the community and exercised frequently. Patient participating in swimming, golf, and gym program. Patient denied chills/fever, numbness/tingling, loss of sensation.   Imaging (Per Chart Review 08/20/2023):  CLINICAL DATA:  Right shoulder pain.   EXAM: MRI OF THE RIGHT SHOULDER WITHOUT CONTRAST   TECHNIQUE: Multiplanar, multisequence MR imaging of the shoulder was performed. No intravenous contrast was administered.   COMPARISON:  Right shoulder radiographs 07/14/2023   FINDINGS: Rotator cuff: There is a small focus of fluid bright signal indicating a tiny partial-thickness midsubstance insertional tear measuring up to 3 mm in transverse dimension (coronal  image 9) and 3 mm in AP dimension (sagittal image 18) within the mid to posterior aspect of the supraspinatus tendon footprint insertion. Mild intermediate T2 signal tendinosis of the mid to posterior supraspinatus. The infraspinatus, subscapularis, and teres minor are intact.   Muscles: No rotator cuff muscle atrophy, fatty infiltration, or edema.   Biceps long head: Mild-to-moderate intermediate T2 signal and thickening tendinosis of the proximal long head of the biceps tendon.   Acromioclavicular Joint: There is again approximate 8 mm elevation of the clavicular head with respect to the acromion. Small acromioclavicular joint effusion with adjacent and possibly connected fluid within the deep aspect of the superior subcutaneous fat. Mild to moderate peripheral clavicular head degenerative osteophytes. Type II acromion. No fluid within the subacromial/subdeltoid bursa.   Glenohumeral Joint: Moderate thinning of the posterior glenoid cartilage moderate posterior subchondral cystic change. Moderate inferior medial humeral head cartilage thinning. Subchondral cysts are also seen within the anterior superior medial humeral head. No glenohumeral joint effusion.   Labrum: There is fluid bright signal indicating a chondrolabral junction tear of the posterior mid to superior glenoid (axial images 11 through 14).   Bones:  No acute fracture.   Other: None.   IMPRESSION: 1. Tiny partial-thickness midsubstance insertional tear of the mid to posterior supraspinatus tendon footprint insertion measuring only 3 mm in transverse dimension and 3 mm in AP dimension. Mild underlying mid to posterior supraspinatus tendinosis. 2. Mild-to-moderate proximal long head of the biceps tendinosis. 3. Moderate glenohumeral cartilage degenerative changes. 4. Chondrolabral junction tear of the posterior mid to superior glenoid. 5. Unchanged elevation of the clavicular head with respect to  the acromion. Small acromioclavicular joint effusion with adjacent and possibly connected fluid within the deep aspect of the superior subcutaneous fat. This is consistent with a type II [place] classification acromioclavicular joint injury.   Electronically Signed   By: Tanda Lyons M.D.   On: 07/25/2023 18:08   PAIN:  Pain Intensity: Present: 0/10, Best: 0/10, Worst: 7/10 Pain location: R Shoulder  Pain Quality: intermittent and stabbing  Radiating: No  Numbness/Tingling: No Focal Weakness: Yes Aggravating factors: moving the Arm, outward reaching  Relieving factors: Resting, Ice 24-hour pain behavior: AM: soreness  and stiffness   History of prior shoulder or neck/shoulder injury, pain, surgery, or therapy: Yes Dominant hand: right  PRECAUTIONS: None  WEIGHT BEARING RESTRICTIONS: No  FALLS: Has patient fallen in last 6 months? Yes. Number of falls 1 Patent reports fall forward up stairs   Living Environment Lives with: lives with their spouse Lives in: House/apartment Stairs: Yes: Internal: 13 steps; can reach both Has following equipment at home: None  Prior level of function: Independent  Occupational demands: Retired   Hobbies: Golf   Patient Goals: Pt states he would like to get back to the gym/swim as fast as possible.  OBJECTIVE:   Patient Surveys  QuickDASH:   Cognition Patient is oriented to person, place, and time.  Recent memory is intact.  Remote memory is intact.  Attention span and concentration are intact.  Expressive speech is intact.  Patient's fund of knowledge is within normal limits for educational level.    Gross Musculoskeletal Assessment Tremor: None Bulk: Normal Tone: Normal  Gait Within Normal Limits   Posture Seated: Rounded Shoulder   Cervical Screen AROM: WFL and painless with overpressure in all planes  AROM AROM (Normal range in degrees) AROM/PROM  Cervical  Flexion (50)   Extension (80)   Right lateral  flexion (45)   Left lateral flexion (45)   Right rotation (85)   Left rotation (85)    Right Left  Shoulder    Flexion Surgical Limitations/90 180  Extension Surgical Limitations   Abduction Surgical Limitations/90 150  External Rotation 60 60  Internal Rotation WNL* WNL  Hands Behind Head    Hands Behind Back        Elbow    Flexion    Extension    Pronation    Supination    (* = pain; Blank rows = not tested)  UE MMT: MMT (out of 5) Right Left   Cervical (isometric)  Flexion WNL  Extension WNL  Lateral Flexion WNL WNL  Rotation WNL WNL      Shoulder   Flexion Surgical Limitations 4  Extension    Abduction Surgical Limitations 4  External rotation 4 4  Internal rotation 4 4  Horizontal abduction    Horizontal adduction    Lower Trapezius    Rhomboids        Elbow  Flexion 5 5  Extension 4 4  Pronation 5 5  Supination 5 5      Wrist  Flexion    Extension    Radial deviation    Ulnar deviation        MCP  Flexion    Extension    Abduction    Adduction    (* = pain; Blank rows = not tested)  Sensation Grossly intact to light touch bilateral UE as determined by testing dermatomes C2-T2. Proprioception and hot/cold testing deferred on this date.  Reflexes Deferred  Palpation Location LEFT  RIGHT           Subocciptials    Cervical paraspinals  0  Upper Trapezius  1  Levator Scapulae  1  Rhomboid Major/Minor    Sternoclavicular joint    Acromioclavicular joint    Coracoid process    Long head of biceps  0  Supraspinatus  0  Infraspinatus  0  Subscapularis    Teres Minor  0  Teres Major    Pectoralis Major  0  Pectoralis Minor  0  Anterior Deltoid  0  Lateral Deltoid  0  Posterior Deltoid    Latissimus Dorsi    Sternocleidomastoid    (Blank rows = not tested) Graded on 0-4 scale (0 = no pain, 1 = pain, 2 = pain with wincing/grimacing/flinching, 3 = pain with withdrawal, 4 = unwilling to allow palpation), (Blank rows = not  tested)   Passive Accessory Intervertebral Motion Deferred  Accessory Motions/Glides Deferred   TODAY'S TREATMENT: DATE:09/16/2023  Subjective: Patient reports 0/10 pain at rest in RUE. Consistent pain with shoulder flexion into specific ranges (at 20, 80) but dissipates with continues AAROM.  No further questions or concerns.    Therapeutic Exercise:   Sidelying Shoulder External Rotation   2 x 10, 3# DB  Prone T AROM  2 x 10, L Only  Prone Scapular Row (60 Shoulder Abd)   1 x 10, AROM  1 x 10, last 3 reps with 3# DB  Standing Shoulder Row with scapular retraction    2 x 12, Grey TB   Therapeutic Activity:    Standing R Shoulder Flexion progression for overhead reaching capacity   1 x 15, PROM with Yd stick LUE assist    1 x 15, AAROM with Yd stick LUE assist    1 x 10 AROM to 90    Standing R Shoulder Abduction Progression for outward reaching capacity    1 x 15, PROM with Yd stick LUE assist    1 x 15, AAROM with Yd stick LUE assist      Standing R Shoulder ER Progression    1 x 15 PROM    1 x 15, AROM - 60 ER without pain    Standing Scaption 90- 130 (without pain endorsed)    1 x 10   PATIENT EDUCATION:  Education details: Exercise, HEP  Person educated: Patient Education method: Explanation, Demonstration, and Handouts Education comprehension: verbalized understanding and returned demonstration   HOME EXERCISE PROGRAM:  Access Code: [email or web address] URL: [email or web address] Date: 09/16/2023 Prepared by: Lonni Pall  Exercises - Standing Bicep Curls Neutral with Dumbbells  - 1 x daily - 3-4 x weekly - 2-3 sets - 10-12 reps - Isometric Shoulder External Rotation at Wall  - 1 x daily - 3-4 x weekly - 2-3 sets - 6 reps - 10s hold - Seated Elbow Flexion with Resistance  - 1 x daily - 3-4 x weekly - 2-3 sets - 10-12 reps - Scapular Retraction with Resistance  - 1 x daily - 3-4 x weekly - 2-3 sets - 10-12 reps - 3-5s  hold - Standing  Shoulder Flexion AAROM with Dowel  - 1 x daily - 7 x weekly - 2-3 sets - 15-20 reps - Standing Shoulder Abduction AAROM with Dowel  - 1 x daily - 7 x weekly - 2-3 sets - 15-20 reps - Prone Shoulder Horizontal Abduction  - 1 x daily - 7 x weekly - 2-3 sets - 10-12 reps - Prone Scapular Retraction and Row  - 1 x daily - 7 x weekly - 2-3 sets - 10-12 reps  ASSESSMENT:  CLINICAL IMPRESSION:  Continued PT POC focused on improving RUE ROM and parascapular strengthening. PT continues to tolerate increased range with AAROM exercises into GHJ flexion, ER and abduction. PT advised not to exceed 90 of abduction in order to protect surgical site. He tolerated Prone shoulder horizontal abduction and shoulder rows without pain. He still has pain within 20-50 degrees of shoulder flexion and abduction but dissipates once he exceeds 50 degrees. PT to continue progressing AAROM to  AROM within tolerance; will monitor progress with scapular and elbow strengthening. Currently patient still has deficits in GHJ ROM, strength and positional pain through ER. Based on today's performance the patient will continue to benefit from skilled PT in order to address current deficits and maximize return to PLOF.   OBJECTIVE IMPAIRMENTS: decreased ROM, decreased strength, increased edema, and pain.   ACTIVITY LIMITATIONS: carrying, lifting, bathing, dressing, reach over head, and hygiene/grooming  PARTICIPATION LIMITATIONS: laundry, driving, yard work, and gym, swimming  PERSONAL FACTORS: Age are also affecting patient's functional outcome.   REHAB POTENTIAL: Good  CLINICAL DECISION MAKING: Stable/uncomplicated  EVALUATION COMPLEXITY: Low   GOALS: Goals reviewed with patient? Yes  SHORT TERM GOALS: Target date: 10/28/2023  Pt will be independent with HEP to improve strength and decrease shoulder pain to improve pain-free function at home and work. Baseline: 08/20/2023: Initial HEP provided. Goal status:  INITIAL   LONG TERM GOALS: Target date: 12/09/2023  Pt will demonstrate R GHJ flexion/abd AROM increase 120 in order to demonstrate improvements towards outward and overhead reaching ADLs.  Baseline: AROM restrictions based on surgical protocol  Goal status: INITIAL  2.  Pt will decrease worst shoulder pain by at least 3 points on the NPRS in order to demonstrate clinically significant reduction in shoulder pain. Baseline: 08/20/2023: 7/10 Goal status: INITIAL  3.  Pt will decrease quick DASH score by at least 8% in order to demonstrate clinically significant reduction in disability related to shoulder pain        Baseline: 08/20/2023: 31.8 / 100 = 31.8 % Goal status: INITIAL  4. Pt will increase R/L shoulder Flex/abd strength by at least 1/2 MMT grade in order to demonstrate improvement in strength and overhead function. Baseline: 08/20/2023:  Right  Left  Flexion Surgical Limitations 4  Extension    Abduction Surgical Limitations 4  Goal status: INITIAL   PLAN: PT FREQUENCY: 1-2x/week  PT DURATION: 12 weeks  PLANNED INTERVENTIONS: Therapeutic exercises, Therapeutic activity, Neuromuscular re-education, Balance training, Gait training, Patient/Family education, Self Care, Joint mobilization, Joint manipulation, Needling, Electrical stimulation, Spinal manipulation, Spinal mobilization, Cryotherapy, Moist heat,  Manual therapy, and Re-evaluation.  PLAN FOR NEXT SESSION: Review HEP; GHJ ER/IR Isometric, elbow strengthening, R GHJ Passive ROM into abd/flex. Manual techniques prn  Lonni Pall PT, DPT Physical Therapist- [place]  09/16/2023, 11:20 AM

## 2023-09-17 ENCOUNTER — Inpatient Hospital Stay
Admission: EM | Admit: 2023-09-17 | Discharge: 2023-09-17 | Disposition: A | Discharge status: Home / Self Care | Attending: Internal Medicine | Admitting: Internal Medicine

## 2023-09-21 ENCOUNTER — Ambulatory Visit

## 2023-09-21 ENCOUNTER — Ambulatory Visit
Admission: EM | Admit: 2023-09-21 | Discharge: 2023-09-21 | Disposition: A | Discharge status: Home / Self Care | Attending: Emergency Medicine | Admitting: Emergency Medicine

## 2023-09-21 DIAGNOSIS — Z23 Encounter for immunization: Secondary | ICD-10-CM

## 2023-09-21 DIAGNOSIS — M25611 Stiffness of right shoulder, not elsewhere classified: Secondary | ICD-10-CM | POA: Diagnosis not present

## 2023-09-21 DIAGNOSIS — M6281 Muscle weakness (generalized): Secondary | ICD-10-CM

## 2023-09-21 DIAGNOSIS — M25511 Pain in right shoulder: Secondary | ICD-10-CM | POA: Diagnosis not present

## 2023-09-21 DIAGNOSIS — Z203 Contact with and (suspected) exposure to rabies: Secondary | ICD-10-CM | POA: Diagnosis not present

## 2023-09-21 MED ORDER — RABIES VACCINE, PCEC IM SUSR
1.0000 mL | Freq: Once | INTRAMUSCULAR | Status: AC
  Administered 2023-09-21: 1 mL via INTRAMUSCULAR

## 2023-09-21 NOTE — Therapy (Signed)
 OUTPATIENT PHYSICAL THERAPY SHOULDER/ELBOW TREATMENT  Patient Name: Juan Holmes MRN: [id number] DOB:[birth year], 73 y.o., male Today's Date: 09/21/2023  END OF SESSION:  PT End of Session - 09/21/23 1118     Visit Number 6    Number of Visits 25    Date for PT Re-Evaluation 11/12/23    Authorization Type [employer] Medicare # [id number]    Authorization Time Period 6/12 - 8/7    Authorization - Number of Visits 16    Progress Note Due on Visit 10    PT Start Time 1118    PT Stop Time 1200    PT Time Calculation (min) 42 min    Activity Tolerance Patient tolerated treatment well    Behavior During Therapy [facility] for tasks assessed/performed          Past Medical History:  Diagnosis Date   Allergic rhinitis due to pollen    Angiodysplasia of cecum 04/03/2015   Benign essential tremor 03/10/1996   Colon polyps 03/10/2009   adenomatous   ED (erectile dysfunction)    Prostate cancer (HCC) 03/10/2008   total prostatectomy   Renal agenesis    Past Surgical History:  Procedure Laterality Date   COLONOSCOPY     KNEE ARTHROSCOPY Right 2012   ROBOT ASSISTED LAPAROSCOPIC RADICAL PROSTATECTOMY N/A 2010   [facility] urology    VASECTOMY  1977   Patient Active Problem List   Diagnosis Date Noted   Paronychia of finger, left 07/27/2023   Essential hypertension, benign 06/01/2023   Rosacea 05/23/2022   Advance directive discussed with patient 11/30/2019   History of colonic polyps 04/03/2015   Angiodysplasia of cecum 04/03/2015   Hyperlipemia 08/11/2014   Alcohol dependence in remission (HCC)    Routine general medical examination at a health care facility 08/31/2012   Benign essential tremor    History of prostate cancer    Allergic rhinitis due to pollen    ED (erectile dysfunction)    Congenital single kidney     PCP: Jimmy Charlie FERNS, MD   REFERRING PROVIDER:  Genelle Standing, MD   REFERRING DIAG:  [phone] (ICD-10-CM) - Injury of right acromioclavicular joint, initial  encounter   RATIONALE FOR EVALUATION AND TREATMENT: Rehabilitation  THERAPY DIAG: Acute pain of right shoulder  Muscle weakness (generalized)  Stiffness of right shoulder, not elsewhere classified  ONSET DATE: s/p AC Joint Repair (08/13/2023)  FOLLOW-UP APPT SCHEDULED WITH REFERRING PROVIDER: Yes  08/27/2023.   SUBJECTIVE:  SUBJECTIVE STATEMENT:    Patient with chief concern of R Shoulder Pain s/p AC joint repair  PERTINENT HISTORY:   Juan Holmes is 73 y.o. male reporting to OPPT s/p AC joint repair via [facility] [place] procedure on 08/13/2023. Patient reports that he injured [facility] joint following a fall and landing on his shoulder. Patient reports that he removed sling since surgery and is able to perform some iADLs without difficuty (I.e.donning a shirt). He has difficulty with shaving using RUE. Pain aggravated with moving into outward reaching (GHJ abd) cross body motion (GHJ add). Pain relieved with ice modalities, rest and gentle range of motion when waking in the AM. Prior to the injury, patient was very active in the community and exercised frequently. Patient participating in swimming, golf, and gym program. Patient denied chills/fever, numbness/tingling, loss of sensation.   Imaging (Per Chart Review 08/20/2023):  CLINICAL DATA:  Right shoulder pain.   EXAM: MRI OF THE RIGHT SHOULDER WITHOUT CONTRAST   TECHNIQUE: Multiplanar, multisequence MR imaging of the shoulder was performed. No intravenous contrast was administered.   COMPARISON:  Right shoulder radiographs 07/14/2023   FINDINGS: Rotator cuff: There is a small focus of fluid bright signal indicating a tiny partial-thickness midsubstance insertional tear measuring up to 3 mm in transverse dimension (coronal image 9) and 3 mm in AP dimension  (sagittal image 18) within the mid to posterior aspect of the supraspinatus tendon footprint insertion. Mild intermediate T2 signal tendinosis of the mid to posterior supraspinatus. The infraspinatus, subscapularis, and teres minor are intact.   Muscles: No rotator cuff muscle atrophy, fatty infiltration, or edema.   Biceps long head: Mild-to-moderate intermediate T2 signal and thickening tendinosis of the proximal long head of the biceps tendon.   Acromioclavicular Joint: There is again approximate 8 mm elevation of the clavicular head with respect to the acromion. Small acromioclavicular joint effusion with adjacent and possibly connected fluid within the deep aspect of the superior subcutaneous fat. Mild to moderate peripheral clavicular head degenerative osteophytes. Type II acromion. No fluid within the subacromial/subdeltoid bursa.   Glenohumeral Joint: Moderate thinning of the posterior glenoid cartilage moderate posterior subchondral cystic change. Moderate inferior medial humeral head cartilage thinning. Subchondral cysts are also seen within the anterior superior medial humeral head. No glenohumeral joint effusion.   Labrum: There is fluid bright signal indicating a chondrolabral junction tear of the posterior mid to superior glenoid (axial images 11 through 14).   Bones:  No acute fracture.   Other: None.   IMPRESSION: 1. Tiny partial-thickness midsubstance insertional tear of the mid to posterior supraspinatus tendon footprint insertion measuring only 3 mm in transverse dimension and 3 mm in AP dimension. Mild underlying mid to posterior supraspinatus tendinosis. 2. Mild-to-moderate proximal long head of the biceps tendinosis. 3. Moderate glenohumeral cartilage degenerative changes. 4. Chondrolabral junction tear of the posterior mid to superior glenoid. 5. Unchanged elevation of the clavicular head with respect to the acromion. Small acromioclavicular joint  effusion with adjacent and possibly connected fluid within the deep aspect of the superior subcutaneous fat. This is consistent with a type II [place] classification acromioclavicular joint injury.   Electronically Signed   By: Tanda Lyons M.D.   On: 07/25/2023 18:08   PAIN:  Pain Intensity: Present: 0/10, Best: 0/10, Worst: 7/10 Pain location: R Shoulder  Pain Quality: intermittent and stabbing  Radiating: No  Numbness/Tingling: No Focal Weakness: Yes Aggravating factors: moving the Arm, outward reaching  Relieving factors: Resting, Ice 24-hour pain behavior: AM: soreness  and stiffness   History of prior shoulder or neck/shoulder injury, pain, surgery, or therapy: Yes Dominant hand: right  PRECAUTIONS: None  WEIGHT BEARING RESTRICTIONS: No  FALLS: Has patient fallen in last 6 months? Yes. Number of falls 1 Patent reports fall forward up stairs   Living Environment Lives with: lives with their spouse Lives in: House/apartment Stairs: Yes: Internal: 13 steps; can reach both Has following equipment at home: None  Prior level of function: Independent  Occupational demands: Retired   Hobbies: Golf   Patient Goals: Pt states he would like to get back to the gym/swim as fast as possible.  OBJECTIVE:   Patient Surveys  QuickDASH:   Cognition Patient is oriented to person, place, and time.  Recent memory is intact.  Remote memory is intact.  Attention span and concentration are intact.  Expressive speech is intact.  Patient's fund of knowledge is within normal limits for educational level.    Gross Musculoskeletal Assessment Tremor: None Bulk: Normal Tone: Normal  Gait Within Normal Limits   Posture Seated: Rounded Shoulder   Cervical Screen AROM: WFL and painless with overpressure in all planes  AROM AROM (Normal range in degrees) AROM/PROM  Cervical  Flexion (50)   Extension (80)   Right lateral flexion (45)   Left lateral flexion (45)    Right rotation (85)   Left rotation (85)    Right Left  Shoulder    Flexion Surgical Limitations/90 180  Extension Surgical Limitations   Abduction Surgical Limitations/90 150  External Rotation 60 60  Internal Rotation WNL* WNL  Hands Behind Head    Hands Behind Back        Elbow    Flexion    Extension    Pronation    Supination    (* = pain; Blank rows = not tested)  UE MMT: MMT (out of 5) Right Left   Cervical (isometric)  Flexion WNL  Extension WNL  Lateral Flexion WNL WNL  Rotation WNL WNL      Shoulder   Flexion Surgical Limitations 4  Extension    Abduction Surgical Limitations 4  External rotation 4 4  Internal rotation 4 4  Horizontal abduction    Horizontal adduction    Lower Trapezius    Rhomboids        Elbow  Flexion 5 5  Extension 4 4  Pronation 5 5  Supination 5 5      Wrist  Flexion    Extension    Radial deviation    Ulnar deviation        MCP  Flexion    Extension    Abduction    Adduction    (* = pain; Blank rows = not tested)  Sensation Grossly intact to light touch bilateral UE as determined by testing dermatomes C2-T2. Proprioception and hot/cold testing deferred on this date.  Reflexes Deferred  Palpation Location LEFT  RIGHT           Subocciptials    Cervical paraspinals  0  Upper Trapezius  1  Levator Scapulae  1  Rhomboid Major/Minor    Sternoclavicular joint    Acromioclavicular joint    Coracoid process    Long head of biceps  0  Supraspinatus  0  Infraspinatus  0  Subscapularis    Teres Minor  0  Teres Major    Pectoralis Major  0  Pectoralis Minor  0  Anterior Deltoid  0  Lateral Deltoid  0  Posterior Deltoid    Latissimus Dorsi    Sternocleidomastoid    (Blank rows = not tested) Graded on 0-4 scale (0 = no pain, 1 = pain, 2 = pain with wincing/grimacing/flinching, 3 = pain with withdrawal, 4 = unwilling to allow palpation), (Blank rows = not tested)   Passive Accessory Intervertebral  Motion Deferred  Accessory Motions/Glides Deferred   TODAY'S TREATMENT: DATE:09/21/2023  Subjective: Patient reports that he has continued pain in the shoulder with movement. Consistent with 20-50 of shoulder flexion. Pain does improve following HEP interventions. Patient will follow up with surgeon tomorrow regarding progress.  No further questions or concerns.    Therapeutic Exercise:   Sidelying Shoulder External Rotation against resistance  1 x 10, 4# DB  2 x 10, 5# DB   Sitting Shoulder ER against resistance   2 x 10, Red TB   - VC for full ROM  Prone T AROM, LUE only  1 x 10, Elbow bent   2 x 10, Elbow Straight   Prone Scapular Row (60 Shoulder Abd)   1 x 10, 4# DB  2 x 10, 5# DB   Standing Lat Pull Down for parascapular strengthening   1 x 10, 10#   2 x 10, 15#   Seated Shoulder Row (Cable Machine)    1 x 10, 10#   2 x 10, 15#  Therapeutic Activity:    Standing R Shoulder Flexion progression for overhead reaching capacity (Thumb Up)    2 x 15, AAROM with Yd stick LUE assist    Standing R Shoulder Abduction Progression for outward reaching capacity    2 x 15, AAROM with Yd stick LUE assist (50-80)    Standing R Shoulder ER    1 x 15, AROM - 50 without pain       PATIENT EDUCATION:  Education details: Exercise, HEP  Person educated: Patient Education method: Explanation, Demonstration, and Handouts Education comprehension: verbalized understanding and returned demonstration   HOME EXERCISE PROGRAM:  Access Code: [email or web address] URL: [email or web address] Date: 09/16/2023 Prepared by: Lonni Pall  Exercises - Standing Bicep Curls Neutral with Dumbbells  - 1 x daily - 3-4 x weekly - 2-3 sets - 10-12 reps - Isometric Shoulder External Rotation at Wall  - 1 x daily - 3-4 x weekly - 2-3 sets - 6 reps - 10s hold - Seated Elbow Flexion with Resistance  - 1 x daily - 3-4 x weekly - 2-3 sets - 10-12 reps - Scapular Retraction with  Resistance  - 1 x daily - 3-4 x weekly - 2-3 sets - 10-12 reps - 3-5s  hold - Standing Shoulder Flexion AAROM with Dowel  - 1 x daily - 7 x weekly - 2-3 sets - 15-20 reps - Standing Shoulder Abduction AAROM with Dowel  - 1 x daily - 7 x weekly - 2-3 sets - 15-20 reps - Prone Shoulder Horizontal Abduction  - 1 x daily - 7 x weekly - 2-3 sets - 10-12 reps - Prone Scapular Retraction and Row  - 1 x daily - 7 x weekly - 2-3 sets - 10-12 reps  ASSESSMENT:  CLINICAL IMPRESSION:  Continued PT POC focused on improving RUE ROM and parascapular strengthening. PT focused on continued progression of PROM to [facility] with shoulder ROM into multiple planes. He tolerated increase in resistance with parascapular exercises using the cable machine. Consistent pain within 20-50 degrees of shoulder flex/abd rated at a 5-6/10 on NPS. Good prognosis of healing and ROM with  continued adherence to HEP. PT encouraged pt to maintain limitations in ranges and not to perform movements eliciting pain. Currently patient still has deficits in GHJ ROM, strength and positional pain through ER. Based on today's performance the patient will continue to benefit from skilled PT in order to address current deficits and maximize return to PLOF.   OBJECTIVE IMPAIRMENTS: decreased ROM, decreased strength, increased edema, and pain.   ACTIVITY LIMITATIONS: carrying, lifting, bathing, dressing, reach over head, and hygiene/grooming  PARTICIPATION LIMITATIONS: laundry, driving, yard work, and gym, swimming  PERSONAL FACTORS: Age are also affecting patient's functional outcome.   REHAB POTENTIAL: Good  CLINICAL DECISION MAKING: Stable/uncomplicated  EVALUATION COMPLEXITY: Low   GOALS: Goals reviewed with patient? Yes  SHORT TERM GOALS: Target date: 11/02/2023  Pt will be independent with HEP to improve strength and decrease shoulder pain to improve pain-free function at home and work. Baseline: 08/20/2023: Initial HEP  provided. Goal status: INITIAL   LONG TERM GOALS: Target date: 12/14/2023  Pt will demonstrate R GHJ flexion/abd AROM increase 120 in order to demonstrate improvements towards outward and overhead reaching ADLs.  Baseline: AROM restrictions based on surgical protocol  Goal status: INITIAL  2.  Pt will decrease worst shoulder pain by at least 3 points on the NPRS in order to demonstrate clinically significant reduction in shoulder pain. Baseline: 08/20/2023: 7/10 Goal status: INITIAL  3.  Pt will decrease quick DASH score by at least 8% in order to demonstrate clinically significant reduction in disability related to shoulder pain        Baseline: 08/20/2023: 31.8 / 100 = 31.8 % Goal status: INITIAL  4. Pt will increase R/L shoulder Flex/abd strength by at least 1/2 MMT grade in order to demonstrate improvement in strength and overhead function. Baseline: 08/20/2023:  Right  Left  Flexion Surgical Limitations 4  Extension    Abduction Surgical Limitations 4  Goal status: INITIAL   PLAN: PT FREQUENCY: 1-2x/week  PT DURATION: 12 weeks  PLANNED INTERVENTIONS: Therapeutic exercises, Therapeutic activity, Neuromuscular re-education, Balance training, Gait training, Patient/Family education, Self Care, Joint mobilization, Joint manipulation, Needling, Electrical stimulation, Spinal manipulation, Spinal mobilization, Cryotherapy, Moist heat,  Manual therapy, and Re-evaluation.  PLAN FOR NEXT SESSION: Review HEP; Progress GHJ ER/IR Isometric, elbow strengthening, R GHJ Passive ROM into abd/flex. Manual techniques prn  Lonni Pall PT, DPT Physical Therapist- [place]   09/21/2023, 11:19 AM

## 2023-09-21 NOTE — ED Triage Notes (Signed)
 Patient is here for 4th rabies shot. No reactions to past vaccinees.

## 2023-09-22 ENCOUNTER — Encounter

## 2023-09-22 DIAGNOSIS — H0288B Meibomian gland dysfunction left eye, upper and lower eyelids: Secondary | ICD-10-CM | POA: Diagnosis not present

## 2023-09-22 DIAGNOSIS — H5203 Hypermetropia, bilateral: Secondary | ICD-10-CM | POA: Diagnosis not present

## 2023-09-22 DIAGNOSIS — H2513 Age-related nuclear cataract, bilateral: Secondary | ICD-10-CM | POA: Diagnosis not present

## 2023-09-22 DIAGNOSIS — H524 Presbyopia: Secondary | ICD-10-CM | POA: Diagnosis not present

## 2023-09-22 DIAGNOSIS — H52223 Regular astigmatism, bilateral: Secondary | ICD-10-CM | POA: Diagnosis not present

## 2023-09-22 DIAGNOSIS — H0288A Meibomian gland dysfunction right eye, upper and lower eyelids: Secondary | ICD-10-CM | POA: Diagnosis not present

## 2023-09-23 ENCOUNTER — Ambulatory Visit (HOSPITAL_BASED_OUTPATIENT_CLINIC_OR_DEPARTMENT_OTHER): Admitting: Orthopaedic Surgery

## 2023-09-23 ENCOUNTER — Encounter

## 2023-09-23 DIAGNOSIS — S4991XA Unspecified injury of right shoulder and upper arm, initial encounter: Secondary | ICD-10-CM

## 2023-09-23 NOTE — Progress Notes (Signed)
 Post Operative Evaluation    Procedure/Date of Surgery: Right shoulder AC repair 6/5  Interval History:   Presents 6 weeks status post the above procedure.  Overall he is doing well.  Overhead range of motion is continuing to improve.  He does have some mild weakness with overhead motion  PMH/PSH/Family History/Social History/Meds/Allergies:    Past Medical History:  Diagnosis Date   Allergic rhinitis due to pollen    Angiodysplasia of cecum 04/03/2015   Benign essential tremor 03/10/1996   Colon polyps 03/10/2009   adenomatous   ED (erectile dysfunction)    Prostate cancer (HCC) 03/10/2008   total prostatectomy   Renal agenesis    Past Surgical History:  Procedure Laterality Date   COLONOSCOPY     KNEE ARTHROSCOPY Right 2012   ROBOT ASSISTED LAPAROSCOPIC RADICAL PROSTATECTOMY N/A 2010   [facility] urology    VASECTOMY  1977   Social History   Socioeconomic History   Marital status: Married    Spouse name: Not on file   Number of children: 2   Years of education: Not on file   Highest education level: Bachelor's degree (e.g., BA, AB, BS)  Occupational History   Occupation: Nurse, adult    Comment: Retired   Occupation: Nurse, learning disability    Comment: Retired  Tobacco Use   Smoking status: Former    Current packs/day: 0.00    Average packs/day: 1 pack/day for 30.0 years (30.0 ttl pk-yrs)    Types: Cigarettes    Start date: 02/01/1985    Quit date: 02/02/2015    Years since quitting: 8.6   Smokeless tobacco: Never  Vaping Use   Vaping status: Never Used  Substance and Sexual Activity   Alcohol use: No    Alcohol/week: 0.0 standard drinks of alcohol   Drug use: No   Sexual activity: Yes  Other Topics Concern   Not on file  Social History Narrative   Divorced long ago   Remarried in 2022      Has living will   wife Jon should make decisions--alternate is brother Cathlyn   Would accept resuscitation   Would  accept tube feeds short term--- but not if cognitively unaware   Social Drivers of Health   Financial Resource Strain: Low Risk  (05/31/2023)   Overall Financial Resource Strain (CARDIA)    Difficulty of Paying Living Expenses: Not hard at all  Food Insecurity: No Food Insecurity (05/31/2023)   Hunger Vital Sign    Worried About Running Out of Food in the Last Year: Never true    Ran Out of Food in the Last Year: Never true  Transportation Needs: No Transportation Needs (05/31/2023)   PRAPARE - Administrator, Civil Service (Medical): No    Lack of Transportation (Non-Medical): No  Physical Activity: Sufficiently Active (05/31/2023)   Exercise Vital Sign    Days of Exercise per Week: 5 days    Minutes of Exercise per Session: 60 min  Stress: No Stress Concern Present (05/31/2023)   [employer] of Occupational Health - Occupational Stress Questionnaire    Feeling of Stress : Only a little  Social Connections: Moderately Integrated (05/31/2023)   Social Connection and Isolation Panel    Frequency of Communication with Friends and Family: Once a week    Frequency of  Social Gatherings with Friends and Family: Once a week    Attends Religious Services: More than 4 times per year    Active Member of [employer] or Organizations: Yes    Attends Engineer, structural: 1 to 4 times per year    Marital Status: Married   Family History  Problem Relation Age of Onset   Cancer Mother    Heart disease Mother    Lymphoma Mother    Stroke Father    Hyperlipidemia Brother    Hypertension Brother    Atrial fibrillation Brother    Hyperlipidemia Brother    Hypertension Brother    Diabetes Brother    Hyperlipidemia Brother    Hypertension Brother    Colon cancer Neg Hx    Rectal cancer Neg Hx    Stomach cancer Neg Hx    Colon polyps Neg Hx    Allergies  Allergen Reactions   Clindamycin /Lincomycin Diarrhea    Got C diff   Penicillins Hives   Current Outpatient  Medications  Medication Sig Dispense Refill   metroNIDAZOLE  (FLAGYL ) 500 MG tablet Take 1 tablet (500 mg total) by mouth 2 (two) times daily. 20 tablet 0   mupirocin  ointment (BACTROBAN ) 2 % Apply 1 Application topically daily. 22 g 0   propranolol  ER (INDERAL  LA) 80 MG 24 hr capsule TAKE ONE CAPSULE (80 MG TOTAL) BY MOUTH DAILY. 90 capsule 3   sildenafil  (REVATIO ) 20 MG tablet TAKE THREE TO FIVE TABLETS BY MOUTH DAILY AS NEEDED (NEED OFFICE VISIT FOR REFILLS) 50 tablet 11   No current facility-administered medications for this visit.   No results found.  Review of Systems:   A ROS was performed including pertinent positives and negatives as documented in the HPI.   Musculoskeletal Exam:    There were no vitals taken for this visit.  Right shoulder incisions are well-appearing.  Active forward elevation is to 150 degrees again with mild hitch.  External rotation at side is 45 degrees.  Ankle is improving  Imaging:      I personally reviewed and interpreted the radiographs.   Assessment:   Status post right shoulder AC joint repair doing extremely well.  At this time he will progress to some strengthening and I will plan to see him back in 6 weeks for reassessment  Plan :    - Return to clinic 6 weeks for reassessment      I personally saw and evaluated the patient, and participated in the management and treatment plan.  Elspeth Parker, MD Attending Physician, Orthopedic Surgery  This document was dictated using Dragon voice recognition software. A reasonable attempt at proof reading has been made to minimize errors.

## 2023-09-29 ENCOUNTER — Ambulatory Visit

## 2023-09-29 DIAGNOSIS — M6281 Muscle weakness (generalized): Secondary | ICD-10-CM

## 2023-09-29 DIAGNOSIS — M25611 Stiffness of right shoulder, not elsewhere classified: Secondary | ICD-10-CM | POA: Diagnosis not present

## 2023-09-29 DIAGNOSIS — M25511 Pain in right shoulder: Secondary | ICD-10-CM | POA: Diagnosis not present

## 2023-09-29 NOTE — Therapy (Signed)
 OUTPATIENT PHYSICAL THERAPY SHOULDER/ELBOW TREATMENT  Patient Name: Juan Holmes MRN: [id number] DOB:December 09, 1950, 73 y.o., male Today's Date: 09/29/2023  END OF SESSION:  PT End of Session - 09/29/23 1123     Visit Number 7    Number of Visits 25    Date for PT Re-Evaluation 11/12/23    Authorization Type [employer] Medicare # [id number]    Authorization Time Period 6/12 - 8/7    Authorization - Visit Number 7    Authorization - Number of Visits 16    Progress Note Due on Visit 10    PT Start Time 1116    PT Stop Time 1158    PT Time Calculation (min) 42 min    Activity Tolerance Patient tolerated treatment well    Behavior During Therapy [facility] for tasks assessed/performed          Past Medical History:  Diagnosis Date   Allergic rhinitis due to pollen    Angiodysplasia of cecum 04/03/2015   Benign essential tremor 03/10/1996   Colon polyps 03/10/2009   adenomatous   ED (erectile dysfunction)    Prostate cancer (HCC) 03/10/2008   total prostatectomy   Renal agenesis    Past Surgical History:  Procedure Laterality Date   COLONOSCOPY     KNEE ARTHROSCOPY Right 2012   ROBOT ASSISTED LAPAROSCOPIC RADICAL PROSTATECTOMY N/A 2010   [facility]    VASECTOMY  1977   Patient Active Problem List   Diagnosis Date Noted   Paronychia of finger, left 07/27/2023   Essential hypertension, benign 06/01/2023   Rosacea 05/23/2022   Advance directive discussed with patient 11/30/2019   History of colonic polyps 04/03/2015   Angiodysplasia of cecum 04/03/2015   Hyperlipemia 08/11/2014   Alcohol dependence in remission (HCC)    Routine general medical examination at a health care facility 08/31/2012   Benign essential tremor    History of prostate cancer    Allergic rhinitis due to pollen    ED (erectile dysfunction)    Congenital single kidney     PCP: Jimmy Charlie FERNS, MD   REFERRING PROVIDER:  Genelle Standing, MD   REFERRING DIAG:  [phone] (ICD-10-CM) - Injury of right  acromioclavicular joint, initial encounter   RATIONALE FOR EVALUATION AND TREATMENT: Rehabilitation  THERAPY DIAG: Acute pain of right shoulder  Muscle weakness (generalized)  Stiffness of right shoulder, not elsewhere classified  ONSET DATE: s/p AC Joint Repair (08/13/2023)  FOLLOW-UP APPT SCHEDULED WITH REFERRING PROVIDER: Yes  08/27/2023.   SUBJECTIVE:  SUBJECTIVE STATEMENT:    Patient with chief concern of R Shoulder Pain s/p AC joint repair  PERTINENT HISTORY:   Juan Holmes is 73 y.o. male reporting to OPPT s/p AC joint repair via [facility] [employer] procedure on 08/13/2023. Patient reports that he injured [facility] joint following a fall and landing on his shoulder. Patient reports that he removed sling since surgery and is able to perform some iADLs without difficuty (I.e.donning a shirt). He has difficulty with shaving using RUE. Pain aggravated with moving into outward reaching (GHJ abd) cross body motion (GHJ add). Pain relieved with ice modalities, rest and gentle range of motion when waking in the AM. Prior to the injury, patient was very active in the community and exercised frequently. Patient participating in swimming, golf, and gym program. Patient denied chills/fever, numbness/tingling, loss of sensation.   Imaging (Per Chart Review 08/20/2023):  CLINICAL DATA:  Right shoulder pain.   EXAM: MRI OF THE RIGHT SHOULDER WITHOUT CONTRAST   TECHNIQUE: Multiplanar, multisequence MR imaging of the shoulder was performed. No intravenous contrast was administered.   COMPARISON:  Right shoulder radiographs 07/14/2023   FINDINGS: Rotator cuff: There is a small focus of fluid bright signal indicating a tiny partial-thickness midsubstance insertional tear measuring up to 3 mm in transverse dimension (coronal  image 9) and 3 mm in AP dimension (sagittal image 18) within the mid to posterior aspect of the supraspinatus tendon footprint insertion. Mild intermediate T2 signal tendinosis of the mid to posterior supraspinatus. The infraspinatus, subscapularis, and teres minor are intact.   Muscles: No rotator cuff muscle atrophy, fatty infiltration, or edema.   Biceps long head: Mild-to-moderate intermediate T2 signal and thickening tendinosis of the proximal long head of the biceps tendon.   Acromioclavicular Joint: There is again approximate 8 mm elevation of the clavicular head with respect to the acromion. Small acromioclavicular joint effusion with adjacent and possibly connected fluid within the deep aspect of the superior subcutaneous fat. Mild to moderate peripheral clavicular head degenerative osteophytes. Type II acromion. No fluid within the subacromial/subdeltoid bursa.   Glenohumeral Joint: Moderate thinning of the posterior glenoid cartilage moderate posterior subchondral cystic change. Moderate inferior medial humeral head cartilage thinning. Subchondral cysts are also seen within the anterior superior medial humeral head. No glenohumeral joint effusion.   Labrum: There is fluid bright signal indicating a chondrolabral junction tear of the posterior mid to superior glenoid (axial images 11 through 14).   Bones:  No acute fracture.   Other: None.   IMPRESSION: 1. Tiny partial-thickness midsubstance insertional tear of the mid to posterior supraspinatus tendon footprint insertion measuring only 3 mm in transverse dimension and 3 mm in AP dimension. Mild underlying mid to posterior supraspinatus tendinosis. 2. Mild-to-moderate proximal long head of the biceps tendinosis. 3. Moderate glenohumeral cartilage degenerative changes. 4. Chondrolabral junction tear of the posterior mid to superior glenoid. 5. Unchanged elevation of the clavicular head with respect to  the acromion. Small acromioclavicular joint effusion with adjacent and possibly connected fluid within the deep aspect of the superior subcutaneous fat. This is consistent with a type II [place] classification acromioclavicular joint injury.   Electronically Signed   By: Tanda Lyons M.D.   On: 07/25/2023 18:08   PAIN:  Pain Intensity: Present: 0/10, Best: 0/10, Worst: 7/10 Pain location: R Shoulder  Pain Quality: intermittent and stabbing  Radiating: No  Numbness/Tingling: No Focal Weakness: Yes Aggravating factors: moving the Arm, outward reaching  Relieving factors: Resting, Ice 24-hour pain behavior: AM: soreness  and stiffness   History of prior shoulder or neck/shoulder injury, pain, surgery, or therapy: Yes Dominant hand: right  PRECAUTIONS: None  WEIGHT BEARING RESTRICTIONS: No  FALLS: Has patient fallen in last 6 months? Yes. Number of falls 1 Patent reports fall forward up stairs   Living Environment Lives with: lives with their spouse Lives in: House/apartment Stairs: Yes: Internal: 13 steps; can reach both Has following equipment at home: None  Prior level of function: Independent  Occupational demands: Retired   Hobbies: Golf   Patient Goals: Pt states he would like to get back to the gym/swim as fast as possible.  OBJECTIVE:   Patient Surveys  QuickDASH:   Cognition Patient is oriented to person, place, and time.  Recent memory is intact.  Remote memory is intact.  Attention span and concentration are intact.  Expressive speech is intact.  Patient's fund of knowledge is within normal limits for educational level.    Gross Musculoskeletal Assessment Tremor: None Bulk: Normal Tone: Normal  Gait Within Normal Limits   Posture Seated: Rounded Shoulder   Cervical Screen AROM: WFL and painless with overpressure in all planes  AROM AROM (Normal range in degrees) AROM/PROM  Cervical  Flexion (50)   Extension (80)   Right lateral  flexion (45)   Left lateral flexion (45)   Right rotation (85)   Left rotation (85)    Right Left  Shoulder    Flexion Surgical Limitations/90 180  Extension Surgical Limitations   Abduction Surgical Limitations/90 150  External Rotation 60 60  Internal Rotation WNL* WNL  Hands Behind Head    Hands Behind Back        Elbow    Flexion    Extension    Pronation    Supination    (* = pain; Blank rows = not tested)  UE MMT: MMT (out of 5) Right Left   Cervical (isometric)  Flexion WNL  Extension WNL  Lateral Flexion WNL WNL  Rotation WNL WNL      Shoulder   Flexion Surgical Limitations 4  Extension    Abduction Surgical Limitations 4  External rotation 4 4  Internal rotation 4 4  Horizontal abduction    Horizontal adduction    Lower Trapezius    Rhomboids        Elbow  Flexion 5 5  Extension 4 4  Pronation 5 5  Supination 5 5      Wrist  Flexion    Extension    Radial deviation    Ulnar deviation        MCP  Flexion    Extension    Abduction    Adduction    (* = pain; Blank rows = not tested)  Sensation Grossly intact to light touch bilateral UE as determined by testing dermatomes C2-T2. Proprioception and hot/cold testing deferred on this date.  Reflexes Deferred  Palpation Location LEFT  RIGHT           Subocciptials    Cervical paraspinals  0  Upper Trapezius  1  Levator Scapulae  1  Rhomboid Major/Minor    Sternoclavicular joint    Acromioclavicular joint    Coracoid process    Long head of biceps  0  Supraspinatus  0  Infraspinatus  0  Subscapularis    Teres Minor  0  Teres Major    Pectoralis Major  0  Pectoralis Minor  0  Anterior Deltoid  0  Lateral Deltoid  0  Posterior Deltoid    Latissimus Dorsi    Sternocleidomastoid    (Blank rows = not tested) Graded on 0-4 scale (0 = no pain, 1 = pain, 2 = pain with wincing/grimacing/flinching, 3 = pain with withdrawal, 4 = unwilling to allow palpation), (Blank rows = not  tested)   Passive Accessory Intervertebral Motion Deferred  Accessory Motions/Glides Deferred   TODAY'S TREATMENT: DATE:09/29/2023  Subjective: Patient f/u with surgeon regarding progress; no concerns from physician at this time. Progress as tolerate - pt was informed that the hitch he feels in specific ranges was considered muscle.   No further questions or concerns.    Therapeutic Exercise (targeting parascapular muscle strength):   Sidelying Shoulder External Rotation against resistance  R: 1 x 10, 3# DB; 1 x 10 4# DB; 1 x 8 4# DB   Sitting Shoulder ER against resistance   2 x 10, Red TB   - VC for full ROM  Prone T AROM, LUE only  2 x 10, Arm Straight   1 x 5 x 5s hold at 90 horizontal abd  Prone I AROM   L: 1 x 10 - 140  Prone Scapular Row (60 Shoulder Abd)   3 x 10 5# DB   Therapeutic Activity (targeting overhead reaching capacity and close chained stabilization for household tasks):    Standing R Shoulder Flexion progression for overhead reaching capacity (Thumb Up)    1 x 15 AAROM with Yd Stick LUE Assist    1 x 10 AROM , - less pain 0   Standing R Shoulder Abduction Progression for outward reaching capacity    1 x 15, AAROM with Yd stick LUE assist (0-90)     1 x 15, AROM to 90 -    Standing R Shoulder ER    1 x 15, AROM - 50 without pain    Standing CKC Rhythmic Stabilization against Blue ball on Wall    3 bouts x 30s - Perturbations applied at distal wrist       PATIENT EDUCATION:  Education details: Exercise, HEP  Person educated: Patient Education method: Explanation, Demonstration, and Handouts Education comprehension: verbalized understanding and returned demonstration   HOME EXERCISE PROGRAM:  Access Code: [email or web address] URL: [email or web address] Date: 09/16/2023 Prepared by: Lonni Pall  Exercises - Standing Bicep Curls Neutral with Dumbbells  - 1 x daily - 3-4 x weekly - 2-3 sets - 10-12 reps - Isometric  Shoulder External Rotation at Wall  - 1 x daily - 3-4 x weekly - 2-3 sets - 6 reps - 10s hold - Seated Elbow Flexion with Resistance  - 1 x daily - 3-4 x weekly - 2-3 sets - 10-12 reps - Scapular Retraction with Resistance  - 1 x daily - 3-4 x weekly - 2-3 sets - 10-12 reps - 3-5s  hold - Standing Shoulder Flexion AAROM with Dowel  - 1 x daily - 7 x weekly - 2-3 sets - 15-20 reps - Standing Shoulder Abduction AAROM with Dowel  - 1 x daily - 7 x weekly - 2-3 sets - 15-20 reps - Prone Shoulder Horizontal Abduction  - 1 x daily - 7 x weekly - 2-3 sets - 10-12 reps - Prone Scapular Retraction and Row  - 1 x daily - 7 x weekly - 2-3 sets - 10-12 reps  ASSESSMENT:  CLINICAL IMPRESSION:  Continued PT POC focused on improving RUE ROM and parascapular strengthening. Patient progressed from [facility] GHJ flex/abd to AROM with minimal pain. Consistent  pain at lower ranges were due to suspected muscular involvement. Exercises were performed with good form and minimal to no pain. Minor fatigue noted in later resistance sets but without symptom exacerbation. Patient continues to show steady progress with improved tolerance to load and active ranges following AC joint post-op timeline. PT to continue progressively loading parascapular muscles to tolerance.  Currently patient still has deficits in GHJ ROM, strength and positional pain through ER. Based on today's performance the patient will continue to benefit from skilled PT in order to address current deficits and maximize return to PLOF.   OBJECTIVE IMPAIRMENTS: decreased ROM, decreased strength, increased edema, and pain.   ACTIVITY LIMITATIONS: carrying, lifting, bathing, dressing, reach over head, and hygiene/grooming  PARTICIPATION LIMITATIONS: laundry, driving, yard work, and gym, swimming  PERSONAL FACTORS: Age are also affecting patient's functional outcome.   REHAB POTENTIAL: Good  CLINICAL DECISION MAKING: Stable/uncomplicated  EVALUATION  COMPLEXITY: Low   GOALS: Goals reviewed with patient? Yes  SHORT TERM GOALS: Target date: 11/10/2023  Pt will be independent with HEP to improve strength and decrease shoulder pain to improve pain-free function at home and work. Baseline: 08/20/2023: Initial HEP provided. Goal status: INITIAL   LONG TERM GOALS: Target date: 12/22/2023  Pt will demonstrate R GHJ flexion/abd AROM increase 120 in order to demonstrate improvements towards outward and overhead reaching ADLs.  Baseline: AROM restrictions based on surgical protocol  Goal status: INITIAL  2.  Pt will decrease worst shoulder pain by at least 3 points on the NPRS in order to demonstrate clinically significant reduction in shoulder pain. Baseline: 08/20/2023: 7/10 Goal status: INITIAL  3.  Pt will decrease quick DASH score by at least 8% in order to demonstrate clinically significant reduction in disability related to shoulder pain        Baseline: 08/20/2023: 31.8 / 100 = 31.8 % Goal status: INITIAL  4. Pt will increase R/L shoulder Flex/abd strength by at least 1/2 MMT grade in order to demonstrate improvement in strength and overhead function. Baseline: 08/20/2023:  Right  Left  Flexion Surgical Limitations 4  Extension    Abduction Surgical Limitations 4  Goal status: INITIAL   PLAN: PT FREQUENCY: 1-2x/week  PT DURATION: 12 weeks  PLANNED INTERVENTIONS: Therapeutic exercises, Therapeutic activity, Neuromuscular re-education, Balance training, Gait training, Patient/Family education, Self Care, Joint mobilization, Joint manipulation, Needling, Electrical stimulation, Spinal manipulation, Spinal mobilization, Cryotherapy, Moist heat,  Manual therapy, and Re-evaluation.  PLAN FOR NEXT SESSION: Review HEP; Progress GHJ ER/IR Isometric, elbow strengthening, R GHJ Passive ROM into abd/flex. Manual techniques prn  Lonni Pall PT, DPT Physical Therapist- [place]  09/29/2023, 11:59 AM

## 2023-10-01 ENCOUNTER — Ambulatory Visit

## 2023-10-01 ENCOUNTER — Encounter (HOSPITAL_BASED_OUTPATIENT_CLINIC_OR_DEPARTMENT_OTHER): Payer: Self-pay | Admitting: Orthopaedic Surgery

## 2023-10-01 DIAGNOSIS — M6281 Muscle weakness (generalized): Secondary | ICD-10-CM

## 2023-10-01 DIAGNOSIS — M25611 Stiffness of right shoulder, not elsewhere classified: Secondary | ICD-10-CM

## 2023-10-01 DIAGNOSIS — M25511 Pain in right shoulder: Secondary | ICD-10-CM | POA: Diagnosis not present

## 2023-10-01 NOTE — Therapy (Signed)
 OUTPATIENT PHYSICAL THERAPY SHOULDER/ELBOW TREATMENT  Patient Name: Juan Holmes MRN: [id number] DOB:1950-05-05, 73 y.o., male Today's Date: 10/01/2023  END OF SESSION:  PT End of Session - 10/01/23 1114     Visit Number 8    Number of Visits 25    Date for PT Re-Evaluation 11/12/23    Authorization Type [employer] Medicare # [id number]    Authorization Time Period 6/12 - 8/7    Authorization - Number of Visits 16    Progress Note Due on Visit 10    PT Start Time 1115    PT Stop Time 1155    PT Time Calculation (min) 40 min    Activity Tolerance Patient tolerated treatment well    Behavior During Therapy [facility] for tasks assessed/performed          Past Medical History:  Diagnosis Date   Allergic rhinitis due to pollen    Angiodysplasia of cecum 04/03/2015   Benign essential tremor 03/10/1996   Colon polyps 03/10/2009   adenomatous   ED (erectile dysfunction)    Prostate cancer (HCC) 03/10/2008   total prostatectomy   Renal agenesis    Past Surgical History:  Procedure Laterality Date   COLONOSCOPY     KNEE ARTHROSCOPY Right 2012   ROBOT ASSISTED LAPAROSCOPIC RADICAL PROSTATECTOMY N/A 2010   [facility] urology    VASECTOMY  1977   Patient Active Problem List   Diagnosis Date Noted   Paronychia of finger, left 07/27/2023   Essential hypertension, benign 06/01/2023   Rosacea 05/23/2022   Advance directive discussed with patient 11/30/2019   History of colonic polyps 04/03/2015   Angiodysplasia of cecum 04/03/2015   Hyperlipemia 08/11/2014   Alcohol dependence in remission (HCC)    Routine general medical examination at a health care facility 08/31/2012   Benign essential tremor    History of prostate cancer    Allergic rhinitis due to pollen    ED (erectile dysfunction)    Congenital single kidney     PCP: Jimmy Charlie FERNS, MD   REFERRING PROVIDER:  Genelle Standing, MD   REFERRING DIAG:  [phone] (ICD-10-CM) - Injury of right acromioclavicular joint, initial  encounter   RATIONALE FOR EVALUATION AND TREATMENT: Rehabilitation  THERAPY DIAG: Acute pain of right shoulder  Muscle weakness (generalized)  Stiffness of right shoulder, not elsewhere classified  ONSET DATE: s/p AC Joint Repair (08/13/2023)  FOLLOW-UP APPT SCHEDULED WITH REFERRING PROVIDER: Yes  08/27/2023.   SUBJECTIVE:  SUBJECTIVE STATEMENT:    Patient with chief concern of R Shoulder Pain s/p AC joint repair  PERTINENT HISTORY:   Juan Holmes is 73 y.o. male reporting to OPPT s/p AC joint repair via [facility] [employer] procedure on 08/13/2023. Patient reports that he injured [facility] joint following a fall and landing on his shoulder. Patient reports that he removed sling since surgery and is able to perform some iADLs without difficuty (I.e.donning a shirt). He has difficulty with shaving using RUE. Pain aggravated with moving into outward reaching (GHJ abd) cross body motion (GHJ add). Pain relieved with ice modalities, rest and gentle range of motion when waking in the AM. Prior to the injury, patient was very active in the community and exercised frequently. Patient participating in swimming, golf, and gym program. Patient denied chills/fever, numbness/tingling, loss of sensation.   Imaging (Per Chart Review 08/20/2023):  CLINICAL DATA:  Right shoulder pain.   EXAM: MRI OF THE RIGHT SHOULDER WITHOUT CONTRAST   TECHNIQUE: Multiplanar, multisequence MR imaging of the shoulder was performed. No intravenous contrast was administered.   COMPARISON:  Right shoulder radiographs 07/14/2023   FINDINGS: Rotator cuff: There is a small focus of fluid bright signal indicating a tiny partial-thickness midsubstance insertional tear measuring up to 3 mm in transverse dimension (coronal image 9) and 3 mm in AP dimension  (sagittal image 18) within the mid to posterior aspect of the supraspinatus tendon footprint insertion. Mild intermediate T2 signal tendinosis of the mid to posterior supraspinatus. The infraspinatus, subscapularis, and teres minor are intact.   Muscles: No rotator cuff muscle atrophy, fatty infiltration, or edema.   Biceps long head: Mild-to-moderate intermediate T2 signal and thickening tendinosis of the proximal long head of the biceps tendon.   Acromioclavicular Joint: There is again approximate 8 mm elevation of the clavicular head with respect to the acromion. Small acromioclavicular joint effusion with adjacent and possibly connected fluid within the deep aspect of the superior subcutaneous fat. Mild to moderate peripheral clavicular head degenerative osteophytes. Type II acromion. No fluid within the subacromial/subdeltoid bursa.   Glenohumeral Joint: Moderate thinning of the posterior glenoid cartilage moderate posterior subchondral cystic change. Moderate inferior medial humeral head cartilage thinning. Subchondral cysts are also seen within the anterior superior medial humeral head. No glenohumeral joint effusion.   Labrum: There is fluid bright signal indicating a chondrolabral junction tear of the posterior mid to superior glenoid (axial images 11 through 14).   Bones:  No acute fracture.   Other: None.   IMPRESSION: 1. Tiny partial-thickness midsubstance insertional tear of the mid to posterior supraspinatus tendon footprint insertion measuring only 3 mm in transverse dimension and 3 mm in AP dimension. Mild underlying mid to posterior supraspinatus tendinosis. 2. Mild-to-moderate proximal long head of the biceps tendinosis. 3. Moderate glenohumeral cartilage degenerative changes. 4. Chondrolabral junction tear of the posterior mid to superior glenoid. 5. Unchanged elevation of the clavicular head with respect to the acromion. Small acromioclavicular joint  effusion with adjacent and possibly connected fluid within the deep aspect of the superior subcutaneous fat. This is consistent with a type II [place] classification acromioclavicular joint injury.   Electronically Signed   By: Tanda Lyons M.D.   On: 07/25/2023 18:08   PAIN:  Pain Intensity: Present: 0/10, Best: 0/10, Worst: 7/10 Pain location: R Shoulder  Pain Quality: intermittent and stabbing  Radiating: No  Numbness/Tingling: No Focal Weakness: Yes Aggravating factors: moving the Arm, outward reaching  Relieving factors: Resting, Ice 24-hour pain behavior: AM: soreness  and stiffness   History of prior shoulder or neck/shoulder injury, pain, surgery, or therapy: Yes Dominant hand: right  PRECAUTIONS: None  WEIGHT BEARING RESTRICTIONS: No  FALLS: Has patient fallen in last 6 months? Yes. Number of falls 1 Patent reports fall forward up stairs   Living Environment Lives with: lives with their spouse Lives in: House/apartment Stairs: Yes: Internal: 13 steps; can reach both Has following equipment at home: None  Prior level of function: Independent  Occupational demands: Retired   Hobbies: Golf   Patient Goals: Pt states he would like to get back to the gym/swim as fast as possible.  OBJECTIVE:   Patient Surveys  QuickDASH:   Cognition Patient is oriented to person, place, and time.  Recent memory is intact.  Remote memory is intact.  Attention span and concentration are intact.  Expressive speech is intact.  Patient's fund of knowledge is within normal limits for educational level.    Gross Musculoskeletal Assessment Tremor: None Bulk: Normal Tone: Normal  Gait Within Normal Limits   Posture Seated: Rounded Shoulder   Cervical Screen AROM: WFL and painless with overpressure in all planes  AROM AROM (Normal range in degrees) AROM/PROM  Cervical  Flexion (50)   Extension (80)   Right lateral flexion (45)   Left lateral flexion (45)    Right rotation (85)   Left rotation (85)    Right Left  Shoulder    Flexion Surgical Limitations/90 180  Extension Surgical Limitations   Abduction Surgical Limitations/90 150  External Rotation 60 60  Internal Rotation WNL* WNL  Hands Behind Head    Hands Behind Back        Elbow    Flexion    Extension    Pronation    Supination    (* = pain; Blank rows = not tested)  UE MMT: MMT (out of 5) Right Left   Cervical (isometric)  Flexion WNL  Extension WNL  Lateral Flexion WNL WNL  Rotation WNL WNL      Shoulder   Flexion Surgical Limitations 4  Extension    Abduction Surgical Limitations 4  External rotation 4 4  Internal rotation 4 4  Horizontal abduction    Horizontal adduction    Lower Trapezius    Rhomboids        Elbow  Flexion 5 5  Extension 4 4  Pronation 5 5  Supination 5 5      Wrist  Flexion    Extension    Radial deviation    Ulnar deviation        MCP  Flexion    Extension    Abduction    Adduction    (* = pain; Blank rows = not tested)  Sensation Grossly intact to light touch bilateral UE as determined by testing dermatomes C2-T2. Proprioception and hot/cold testing deferred on this date.  Reflexes Deferred  Palpation Location LEFT  RIGHT           Subocciptials    Cervical paraspinals  0  Upper Trapezius  1  Levator Scapulae  1  Rhomboid Major/Minor    Sternoclavicular joint    Acromioclavicular joint    Coracoid process    Long head of biceps  0  Supraspinatus  0  Infraspinatus  0  Subscapularis    Teres Minor  0  Teres Major    Pectoralis Major  0  Pectoralis Minor  0  Anterior Deltoid  0  Lateral Deltoid  0  Posterior Deltoid    Latissimus Dorsi    Sternocleidomastoid    (Blank rows = not tested) Graded on 0-4 scale (0 = no pain, 1 = pain, 2 = pain with wincing/grimacing/flinching, 3 = pain with withdrawal, 4 = unwilling to allow palpation), (Blank rows = not tested)   Passive Accessory Intervertebral  Motion Deferred  Accessory Motions/Glides Deferred   TODAY'S TREATMENT: DATE: 10/01/23   Subjective: Patient without R shoulder pain walking into the clinic. Following the last session patient without major soreness in the R shoulder. No further questions or concerns.    Therapeutic Exercise (targeting parascapular muscle strength):   Sidelying Shoulder External Rotation against resistance  R Only: 3 x 10, 4# DB  Standing Shoulder ER against resistance   R Only: 1 x 10 Green TB   Sitting Shoulder ER against resistance   2 x 10, Red TB   - VC for full ROM  Standing Shoulder ER with Walk out  1 x 10, Green TB  2 x 10, Blue TB   Lat Pull Down for overhead mobility  1 x 10, 15#, slowed tempo - pt achieved shoulder abd past 90 with no pain   Scapular Row against cable   2 x 10 15#   Shoulder Press with isometric ER to 90  2 x 10, red TB    - Sharp pain in the shoulder approaching 90 abd   Therapeutic Activity (targeting overhead reaching capacity and close chained stabilization for household tasks):    Standing R Shoulder Flexion with stair rail assist   2 x 10   Standing R Shoulder Abduction with stair rail assist   2 x 15   Standing Pendulums throughout different exercises    5 x 10 rotations    Standing Shoulder Flexion to 90   2 x 10 2# DB    PATIENT EDUCATION:  Education details: Exercise, HEP  Person educated: Patient Education method: Explanation, Demonstration, and Handouts Education comprehension: verbalized understanding and returned demonstration   HOME EXERCISE PROGRAM:  Access Code: [email or web address] URL: [email or web address] Date: 10/01/2023 Prepared by: Lonni Pall  Exercises - Standing Bicep Curls Neutral with Dumbbells  - 1 x daily - 3-4 x weekly - 2-3 sets - 10-12 reps - Seated Elbow Flexion with Resistance  - 1 x daily - 3-4 x weekly - 2-3 sets - 10-12 reps - Shoulder External Rotation with Anchored Resistance  - 1 x daily  - 3-4 x weekly - 2-3 sets - 10 reps - Standing Shoulder Row with Anchored Resistance  - 1 x daily - 7 x weekly - 3 sets - 10 reps - Standing Shoulder Flexion AAROM with Dowel  - 1 x daily - 7 x weekly - 2-3 sets - 15-20 reps - Standing Shoulder Abduction AAROM with Dowel  - 1 x daily - 7 x weekly - 2-3 sets - 15-20 reps - Prone Shoulder Horizontal Abduction  - 1 x daily - 7 x weekly - 2-3 sets - 10-12 reps - Prone Scapular Retraction and Row  - 1 x daily - 7 x weekly - 2-3 sets - 10-12 reps - Prone Shoulder Flexion  - 1 x daily - 7 x weekly - 2-3 sets - 10-12 reps  ASSESSMENT:  CLINICAL IMPRESSION:  Continued PT POC focused on improving RUE ROM and parascapular strengthening. He continues to tolerate AROM with R shoulder with minimal to no pain. Today's session PT focused on rotator cuff strengthening in order to address painful arc and  pain at rotator cuff attachment. Good tolerance to all exercises except OH with resistance to 90 abd. PT to continue progressively loading parascapular muscles to tolerance.  Currently patient still has deficits in GHJ ROM, strength and positional pain through ER. Based on today's performance the patient will continue to benefit from skilled PT in order to address current deficits and maximize return to PLOF.   OBJECTIVE IMPAIRMENTS: decreased ROM, decreased strength, increased edema, and pain.   ACTIVITY LIMITATIONS: carrying, lifting, bathing, dressing, reach over head, and hygiene/grooming  PARTICIPATION LIMITATIONS: laundry, driving, yard work, and gym, swimming  PERSONAL FACTORS: Age are also affecting patient's functional outcome.   REHAB POTENTIAL: Good  CLINICAL DECISION MAKING: Stable/uncomplicated  EVALUATION COMPLEXITY: Low   GOALS: Goals reviewed with patient? Yes  SHORT TERM GOALS: Target date: 11/12/2023  Pt will be independent with HEP to improve strength and decrease shoulder pain to improve pain-free function at home and  work. Baseline: 08/20/2023: Initial HEP provided. Goal status: INITIAL   LONG TERM GOALS: Target date: 12/24/2023  Pt will demonstrate R GHJ flexion/abd AROM increase 120 in order to demonstrate improvements towards outward and overhead reaching ADLs.  Baseline: AROM restrictions based on surgical protocol  Goal status: INITIAL  2.  Pt will decrease worst shoulder pain by at least 3 points on the NPRS in order to demonstrate clinically significant reduction in shoulder pain. Baseline: 08/20/2023: 7/10 Goal status: INITIAL  3.  Pt will decrease quick DASH score by at least 8% in order to demonstrate clinically significant reduction in disability related to shoulder pain        Baseline: 08/20/2023: 31.8 / 100 = 31.8 % Goal status: INITIAL  4. Pt will increase R/L shoulder Flex/abd strength by at least 1/2 MMT grade in order to demonstrate improvement in strength and overhead function. Baseline: 08/20/2023:  Right  Left  Flexion Surgical Limitations 4  Extension    Abduction Surgical Limitations 4  Goal status: INITIAL   PLAN: PT FREQUENCY: 1-2x/week  PT DURATION: 12 weeks  PLANNED INTERVENTIONS: Therapeutic exercises, Therapeutic activity, Neuromuscular re-education, Balance training, Gait training, Patient/Family education, Self Care, Joint mobilization, Joint manipulation, Needling, Electrical stimulation, Spinal manipulation, Spinal mobilization, Cryotherapy, Moist heat,  Manual therapy, and Re-evaluation.  PLAN FOR NEXT SESSION: Review HEP; Progress GHJ ER/IR Isometric, elbow strengthening, R GHJ Passive ROM into abd/flex. Manual techniques prn  Lonni Pall PT, DPT Physical Therapist- [place]  10/01/2023, 11:14 AM

## 2023-10-06 ENCOUNTER — Ambulatory Visit

## 2023-10-06 DIAGNOSIS — M25511 Pain in right shoulder: Secondary | ICD-10-CM | POA: Diagnosis not present

## 2023-10-06 DIAGNOSIS — M25611 Stiffness of right shoulder, not elsewhere classified: Secondary | ICD-10-CM | POA: Diagnosis not present

## 2023-10-06 DIAGNOSIS — M6281 Muscle weakness (generalized): Secondary | ICD-10-CM | POA: Diagnosis not present

## 2023-10-06 NOTE — Therapy (Signed)
 OUTPATIENT PHYSICAL THERAPY SHOULDER/ELBOW TREATMENT  Patient Name: Juan Holmes MRN: [id number] DOB:March 17, 1950, 73 y.o., male Today's Date: 10/06/2023  END OF SESSION:  PT End of Session - 10/06/23 1116     Visit Number 9    Number of Visits 25    Date for PT Re-Evaluation 11/12/23    Authorization Type [employer] Medicare # [id number]    Authorization Time Period 6/12 - 8/7    Authorization - Number of Visits 16    Progress Note Due on Visit 10    PT Start Time 1116    PT Stop Time 1155    PT Time Calculation (min) 39 min    Activity Tolerance Patient tolerated treatment well    Behavior During Therapy [facility] for tasks assessed/performed          Past Medical History:  Diagnosis Date   Allergic rhinitis due to pollen    Angiodysplasia of cecum 04/03/2015   Benign essential tremor 03/10/1996   Colon polyps 03/10/2009   adenomatous   ED (erectile dysfunction)    Prostate cancer (HCC) 03/10/2008   total prostatectomy   Renal agenesis    Past Surgical History:  Procedure Laterality Date   COLONOSCOPY     KNEE ARTHROSCOPY Right 2012   ROBOT ASSISTED LAPAROSCOPIC RADICAL PROSTATECTOMY N/A 2010   [facility] urology    VASECTOMY  1977   Patient Active Problem List   Diagnosis Date Noted   Paronychia of finger, left 07/27/2023   Essential hypertension, benign 06/01/2023   Rosacea 05/23/2022   Advance directive discussed with patient 11/30/2019   History of colonic polyps 04/03/2015   Angiodysplasia of cecum 04/03/2015   Hyperlipemia 08/11/2014   Alcohol dependence in remission (HCC)    Routine general medical examination at a health care facility 08/31/2012   Benign essential tremor    History of prostate cancer    Allergic rhinitis due to pollen    ED (erectile dysfunction)    Congenital single kidney     PCP: Jimmy Charlie FERNS, MD   REFERRING PROVIDER:  Genelle Standing, MD   REFERRING DIAG:  [phone] (ICD-10-CM) - Injury of right acromioclavicular joint, initial  encounter   RATIONALE FOR EVALUATION AND TREATMENT: Rehabilitation  THERAPY DIAG: Acute pain of right shoulder  Muscle weakness (generalized)  Stiffness of right shoulder, not elsewhere classified  ONSET DATE: s/p AC Joint Repair (08/13/2023)  FOLLOW-UP APPT SCHEDULED WITH REFERRING PROVIDER: Yes  08/27/2023.   SUBJECTIVE:  SUBJECTIVE STATEMENT:    Patient with chief concern of R Shoulder Pain s/p AC joint repair  PERTINENT HISTORY:   Juan Holmes is 73 y.o. male reporting to OPPT s/p AC joint repair via [facility] [employer] procedure on 08/13/2023. Patient reports that he injured [facility] joint following a fall and landing on his shoulder. Patient reports that he removed sling since surgery and is able to perform some iADLs without difficuty (I.e.donning a shirt). He has difficulty with shaving using RUE. Pain aggravated with moving into outward reaching (GHJ abd) cross body motion (GHJ add). Pain relieved with ice modalities, rest and gentle range of motion when waking in the AM. Prior to the injury, patient was very active in the community and exercised frequently. Patient participating in swimming, golf, and gym program. Patient denied chills/fever, numbness/tingling, loss of sensation.   Imaging (Per Chart Review 08/20/2023):  CLINICAL DATA:  Right shoulder pain.   EXAM: MRI OF THE RIGHT SHOULDER WITHOUT CONTRAST   TECHNIQUE: Multiplanar, multisequence MR imaging of the shoulder was performed. No intravenous contrast was administered.   COMPARISON:  Right shoulder radiographs 07/14/2023   FINDINGS: Rotator cuff: There is a small focus of fluid bright signal indicating a tiny partial-thickness midsubstance insertional tear measuring up to 3 mm in transverse dimension (coronal image 9) and 3 mm in AP dimension  (sagittal image 18) within the mid to posterior aspect of the supraspinatus tendon footprint insertion. Mild intermediate T2 signal tendinosis of the mid to posterior supraspinatus. The infraspinatus, subscapularis, and teres minor are intact.   Muscles: No rotator cuff muscle atrophy, fatty infiltration, or edema.   Biceps long head: Mild-to-moderate intermediate T2 signal and thickening tendinosis of the proximal long head of the biceps tendon.   Acromioclavicular Joint: There is again approximate 8 mm elevation of the clavicular head with respect to the acromion. Small acromioclavicular joint effusion with adjacent and possibly connected fluid within the deep aspect of the superior subcutaneous fat. Mild to moderate peripheral clavicular head degenerative osteophytes. Type II acromion. No fluid within the subacromial/subdeltoid bursa.   Glenohumeral Joint: Moderate thinning of the posterior glenoid cartilage moderate posterior subchondral cystic change. Moderate inferior medial humeral head cartilage thinning. Subchondral cysts are also seen within the anterior superior medial humeral head. No glenohumeral joint effusion.   Labrum: There is fluid bright signal indicating a chondrolabral junction tear of the posterior mid to superior glenoid (axial images 11 through 14).   Bones:  No acute fracture.   Other: None.   IMPRESSION: 1. Tiny partial-thickness midsubstance insertional tear of the mid to posterior supraspinatus tendon footprint insertion measuring only 3 mm in transverse dimension and 3 mm in AP dimension. Mild underlying mid to posterior supraspinatus tendinosis. 2. Mild-to-moderate proximal long head of the biceps tendinosis. 3. Moderate glenohumeral cartilage degenerative changes. 4. Chondrolabral junction tear of the posterior mid to superior glenoid. 5. Unchanged elevation of the clavicular head with respect to the acromion. Small acromioclavicular joint  effusion with adjacent and possibly connected fluid within the deep aspect of the superior subcutaneous fat. This is consistent with a type II [place] classification acromioclavicular joint injury.   Electronically Signed   By: Tanda Lyons M.D.   On: 07/25/2023 18:08   PAIN:  Pain Intensity: Present: 0/10, Best: 0/10, Worst: 7/10 Pain location: R Shoulder  Pain Quality: intermittent and stabbing  Radiating: No  Numbness/Tingling: No Focal Weakness: Yes Aggravating factors: moving the Arm, outward reaching  Relieving factors: Resting, Ice 24-hour pain behavior: AM: soreness  and stiffness   History of prior shoulder or neck/shoulder injury, pain, surgery, or therapy: Yes Dominant hand: right  PRECAUTIONS: None  WEIGHT BEARING RESTRICTIONS: No  FALLS: Has patient fallen in last 6 months? Yes. Number of falls 1 Patent reports fall forward up stairs   Living Environment Lives with: lives with their spouse Lives in: House/apartment Stairs: Yes: Internal: 13 steps; can reach both Has following equipment at home: None  Prior level of function: Independent  Occupational demands: Retired   Hobbies: Golf   Patient Goals: Pt states he would like to get back to the gym/swim as fast as possible.  OBJECTIVE:   Patient Surveys  QuickDASH:   Cognition Patient is oriented to person, place, and time.  Recent memory is intact.  Remote memory is intact.  Attention span and concentration are intact.  Expressive speech is intact.  Patient's fund of knowledge is within normal limits for educational level.    Gross Musculoskeletal Assessment Tremor: None Bulk: Normal Tone: Normal  Gait Within Normal Limits   Posture Seated: Rounded Shoulder   Cervical Screen AROM: WFL and painless with overpressure in all planes  AROM AROM (Normal range in degrees) AROM/PROM  Cervical  Flexion (50)   Extension (80)   Right lateral flexion (45)   Left lateral flexion (45)    Right rotation (85)   Left rotation (85)    Right Left  Shoulder    Flexion Surgical Limitations/90 180  Extension Surgical Limitations   Abduction Surgical Limitations/90 150  External Rotation 60 60  Internal Rotation WNL* WNL  Hands Behind Head    Hands Behind Back        Elbow    Flexion    Extension    Pronation    Supination    (* = pain; Blank rows = not tested)  UE MMT: MMT (out of 5) Right Left   Cervical (isometric)  Flexion WNL  Extension WNL  Lateral Flexion WNL WNL  Rotation WNL WNL      Shoulder   Flexion Surgical Limitations 4  Extension    Abduction Surgical Limitations 4  External rotation 4 4  Internal rotation 4 4  Horizontal abduction    Horizontal adduction    Lower Trapezius    Rhomboids        Elbow  Flexion 5 5  Extension 4 4  Pronation 5 5  Supination 5 5      Wrist  Flexion    Extension    Radial deviation    Ulnar deviation        MCP  Flexion    Extension    Abduction    Adduction    (* = pain; Blank rows = not tested)  Sensation Grossly intact to light touch bilateral UE as determined by testing dermatomes C2-T2. Proprioception and hot/cold testing deferred on this date.  Reflexes Deferred  Palpation Location LEFT  RIGHT           Subocciptials    Cervical paraspinals  0  Upper Trapezius  1  Levator Scapulae  1  Rhomboid Major/Minor    Sternoclavicular joint    Acromioclavicular joint    Coracoid process    Long head of biceps  0  Supraspinatus  0  Infraspinatus  0  Subscapularis    Teres Minor  0  Teres Major    Pectoralis Major  0  Pectoralis Minor  0  Anterior Deltoid  0  Lateral Deltoid  0  Posterior Deltoid    Latissimus Dorsi    Sternocleidomastoid    (Blank rows = not tested) Graded on 0-4 scale (0 = no pain, 1 = pain, 2 = pain with wincing/grimacing/flinching, 3 = pain with withdrawal, 4 = unwilling to allow palpation), (Blank rows = not tested)   Passive Accessory Intervertebral  Motion Deferred  Accessory Motions/Glides Deferred   TODAY'S TREATMENT: DATE: 10/06/23  Subjective: Patient with 0/10 pain at start of the session. Difficulties with Prone Shoulder flexion on HEP. Patient report No further questions or concerns.    Therapeutic Exercise (targeting parascapular muscle strength):   UBE - 5 min - 2.5 min Fwd, 2.5 min Retro - Level 6-10 for UE Warm Up, strength and muscular endurance; PT manually adjusted resistance throughout to patient's tolerance.   Omega Engineer, structural Down for overhead mobility and parascapular strength  1 x 10, 15#  2 x 10, 20#   Shoulder Row for paracapular strengthening  1 x 10 15#   1 x 10 20#  1 x 10 25#   Standing R GHJ ER with OH press (Elbow Abd to 80)   1 x 10, Red TB   2 x 10, YTB  Standing R GHJ ER Isometric with Lateral Walkout   2 x 10, RTB   Sidelying RGHJ ER against resistance   2 x 10, 3# DB   1 x 10, 5# DB   Prone W's with OH reach for deltoid strengthening   2 x 8 - notable moderate weakness  Prone Swimming Stroke - AAROM to AROM (last 3 reps)   1 x 15 reps   PATIENT EDUCATION:  Education details: Exercise, HEP  Person educated: Patient Education method: Explanation, Demonstration, and Handouts Education comprehension: verbalized understanding and returned demonstration   HOME EXERCISE PROGRAM:  Access Code: [email or web address] URL: [email or web address] Date: 10/01/2023 Prepared by: Lonni Pall  Exercises - Standing Bicep Curls Neutral with Dumbbells  - 1 x daily - 3-4 x weekly - 2-3 sets - 10-12 reps - Seated Elbow Flexion with Resistance  - 1 x daily - 3-4 x weekly - 2-3 sets - 10-12 reps - Shoulder External Rotation with Anchored Resistance  - 1 x daily - 3-4 x weekly - 2-3 sets - 10 reps - Standing Shoulder Row with Anchored Resistance  - 1 x daily - 7 x weekly - 3 sets - 10 reps - Standing Shoulder Flexion AAROM with Dowel  - 1 x daily - 7 x weekly - 2-3 sets - 15-20  reps - Standing Shoulder Abduction AAROM with Dowel  - 1 x daily - 7 x weekly - 2-3 sets - 15-20 reps - Prone Shoulder Horizontal Abduction  - 1 x daily - 7 x weekly - 2-3 sets - 10-12 reps - Prone Scapular Retraction and Row  - 1 x daily - 7 x weekly - 2-3 sets - 10-12 reps - Prone Shoulder Flexion  - 1 x daily - 7 x weekly - 2-3 sets - 10-12 reps  ASSESSMENT:  CLINICAL IMPRESSION: Continued PT POC focused on improving RUE ROM and parascapular strengthening. Tolerated increased weight with parascapular strengthening exercises. Good tolerance to [facility] practicing a swimming stroke using segmental movements of scapular retraction into a W then proceeding with OH reach.  Currently patient still has deficits in GHJ ROM, strength and positional pain through ER. Based on today's performance the patient will continue to benefit from skilled PT in order to address current deficits and maximize return to  PLOF.   OBJECTIVE IMPAIRMENTS: decreased ROM, decreased strength, increased edema, and pain.   ACTIVITY LIMITATIONS: carrying, lifting, bathing, dressing, reach over head, and hygiene/grooming  PARTICIPATION LIMITATIONS: laundry, driving, yard work, and gym, swimming  PERSONAL FACTORS: Age are also affecting patient's functional outcome.   REHAB POTENTIAL: Good  CLINICAL DECISION MAKING: Stable/uncomplicated  EVALUATION COMPLEXITY: Low   GOALS: Goals reviewed with patient? Yes  SHORT TERM GOALS: Target date: 11/17/2023  Pt will be independent with HEP to improve strength and decrease shoulder pain to improve pain-free function at home and work. Baseline: 08/20/2023: Initial HEP provided. Goal status: INITIAL   LONG TERM GOALS: Target date: 12/29/2023  Pt will demonstrate R GHJ flexion/abd AROM increase 120 in order to demonstrate improvements towards outward and overhead reaching ADLs.  Baseline: AROM restrictions based on surgical protocol  Goal status: INITIAL  2.  Pt will  decrease worst shoulder pain by at least 3 points on the NPRS in order to demonstrate clinically significant reduction in shoulder pain. Baseline: 08/20/2023: 7/10 Goal status: INITIAL  3.  Pt will decrease quick DASH score by at least 8% in order to demonstrate clinically significant reduction in disability related to shoulder pain        Baseline: 08/20/2023: 31.8 / 100 = 31.8 % Goal status: INITIAL  4. Pt will increase R/L shoulder Flex/abd strength by at least 1/2 MMT grade in order to demonstrate improvement in strength and overhead function. Baseline: 08/20/2023:  Right  Left  Flexion Surgical Limitations 4  Extension    Abduction Surgical Limitations 4  Goal status: INITIAL   PLAN: PT FREQUENCY: 1-2x/week  PT DURATION: 12 weeks  PLANNED INTERVENTIONS: Therapeutic exercises, Therapeutic activity, Neuromuscular re-education, Balance training, Gait training, Patient/Family education, Self Care, Joint mobilization, Joint manipulation, Needling, Electrical stimulation, Spinal manipulation, Spinal mobilization, Cryotherapy, Moist heat,  Manual therapy, and Re-evaluation.  PLAN FOR NEXT SESSION: Review HEP; Progress GHJ ER/IR Isometric, elbow strengthening, R GHJ Passive ROM into abd/flex. Manual techniques prn  Lonni Pall PT, DPT Physical Therapist- [place]  10/06/2023, 11:17 AM

## 2023-10-08 ENCOUNTER — Ambulatory Visit

## 2023-10-08 DIAGNOSIS — M25611 Stiffness of right shoulder, not elsewhere classified: Secondary | ICD-10-CM | POA: Diagnosis not present

## 2023-10-08 DIAGNOSIS — M6281 Muscle weakness (generalized): Secondary | ICD-10-CM | POA: Diagnosis not present

## 2023-10-08 DIAGNOSIS — M25511 Pain in right shoulder: Secondary | ICD-10-CM | POA: Diagnosis not present

## 2023-10-08 NOTE — Therapy (Addendum)
 OUTPATIENT PHYSICAL THERAPY SHOULDER/ELBOW TREATMENT/PROGRESS NOTE  Dates of reporting period  08/20/2023   to   10/08/2023    Patient Name: Juan Holmes MRN: [id number] DOB:[birth year], 73 y.o., male Today's Date: 10/08/2023  END OF SESSION:  PT End of Session - 10/08/23 1118     Visit Number 10    Number of Visits 25    Date for PT Re-Evaluation 11/12/23    Authorization Type [employer] Medicare # [id number]    Authorization Time Period 6/12 - 8/7    Authorization - Visit Number 10    Authorization - Number of Visits 16    Progress Note Due on Visit 10    PT Start Time 1117    PT Stop Time 1200    PT Time Calculation (min) 43 min    Activity Tolerance Patient tolerated treatment well    Behavior During Therapy [facility] for tasks assessed/performed          Past Medical History:  Diagnosis Date   Allergic rhinitis due to pollen    Angiodysplasia of cecum 04/03/2015   Benign essential tremor 03/10/1996   Colon polyps 03/10/2009   adenomatous   ED (erectile dysfunction)    Prostate cancer (HCC) 03/10/2008   total prostatectomy   Renal agenesis    Past Surgical History:  Procedure Laterality Date   COLONOSCOPY     KNEE ARTHROSCOPY Right 2012   ROBOT ASSISTED LAPAROSCOPIC RADICAL PROSTATECTOMY N/A 2010   [facility] urology    VASECTOMY  1977   Patient Active Problem List   Diagnosis Date Noted   Paronychia of finger, left 07/27/2023   Essential hypertension, benign 06/01/2023   Rosacea 05/23/2022   Advance directive discussed with patient 11/30/2019   History of colonic polyps 04/03/2015   Angiodysplasia of cecum 04/03/2015   Hyperlipemia 08/11/2014   Alcohol dependence in remission (HCC)    Routine general medical examination at a health care facility 08/31/2012   Benign essential tremor    History of prostate cancer    Allergic rhinitis due to pollen    ED (erectile dysfunction)    Congenital single kidney     PCP: Jimmy Charlie FERNS, MD   REFERRING PROVIDER:   Genelle Standing, MD   REFERRING DIAG:  [phone] (ICD-10-CM) - Injury of right acromioclavicular joint, initial encounter   RATIONALE FOR EVALUATION AND TREATMENT: Rehabilitation  THERAPY DIAG: Acute pain of right shoulder  Muscle weakness (generalized)  Stiffness of right shoulder, not elsewhere classified  ONSET DATE: s/p AC Joint Repair (08/13/2023)  FOLLOW-UP APPT SCHEDULED WITH REFERRING PROVIDER: Yes  08/27/2023.   SUBJECTIVE:  SUBJECTIVE STATEMENT:    Patient with chief concern of R Shoulder Pain s/p AC joint repair  PERTINENT HISTORY:   Juan Holmes is 73 y.o. male reporting to OPPT s/p AC joint repair via [facility] [place] procedure on 08/13/2023. Patient reports that he injured [name] joint following a fall and landing on his shoulder. Patient reports that he removed sling since surgery and is able to perform some iADLs without difficuty (I.e.donning a shirt). He has difficulty with shaving using RUE. Pain aggravated with moving into outward reaching (GHJ abd) cross body motion (GHJ add). Pain relieved with ice modalities, rest and gentle range of motion when waking in the AM. Prior to the injury, patient was very active in the community and exercised frequently. Patient participating in swimming, golf, and gym program. Patient denied chills/fever, numbness/tingling, loss of sensation.   Imaging (Per Chart Review 08/20/2023):  CLINICAL DATA:  Right shoulder pain.   EXAM: MRI OF THE RIGHT SHOULDER WITHOUT CONTRAST   TECHNIQUE: Multiplanar, multisequence MR imaging of the shoulder was performed. No intravenous contrast was administered.   COMPARISON:  Right shoulder radiographs 07/14/2023   FINDINGS: Rotator cuff: There is a small focus of fluid bright signal indicating a tiny partial-thickness  midsubstance insertional tear measuring up to 3 mm in transverse dimension (coronal image 9) and 3 mm in AP dimension (sagittal image 18) within the mid to posterior aspect of the supraspinatus tendon footprint insertion. Mild intermediate T2 signal tendinosis of the mid to posterior supraspinatus. The infraspinatus, subscapularis, and teres minor are intact.   Muscles: No rotator cuff muscle atrophy, fatty infiltration, or edema.   Biceps long head: Mild-to-moderate intermediate T2 signal and thickening tendinosis of the proximal long head of the biceps tendon.   Acromioclavicular Joint: There is again approximate 8 mm elevation of the clavicular head with respect to the acromion. Small acromioclavicular joint effusion with adjacent and possibly connected fluid within the deep aspect of the superior subcutaneous fat. Mild to moderate peripheral clavicular head degenerative osteophytes. Type II acromion. No fluid within the subacromial/subdeltoid bursa.   Glenohumeral Joint: Moderate thinning of the posterior glenoid cartilage moderate posterior subchondral cystic change. Moderate inferior medial humeral head cartilage thinning. Subchondral cysts are also seen within the anterior superior medial humeral head. No glenohumeral joint effusion.   Labrum: There is fluid bright signal indicating a chondrolabral junction tear of the posterior mid to superior glenoid (axial images 11 through 14).   Bones:  No acute fracture.   Other: None.   IMPRESSION: 1. Tiny partial-thickness midsubstance insertional tear of the mid to posterior supraspinatus tendon footprint insertion measuring only 3 mm in transverse dimension and 3 mm in AP dimension. Mild underlying mid to posterior supraspinatus tendinosis. 2. Mild-to-moderate proximal long head of the biceps tendinosis. 3. Moderate glenohumeral cartilage degenerative changes. 4. Chondrolabral junction tear of the posterior mid to  superior glenoid. 5. Unchanged elevation of the clavicular head with respect to the acromion. Small acromioclavicular joint effusion with adjacent and possibly connected fluid within the deep aspect of the superior subcutaneous fat. This is consistent with a type II [place] classification acromioclavicular joint injury.   Electronically Signed   By: Tanda Lyons M.D.   On: 07/25/2023 18:08   PAIN:  Pain Intensity: Present: 0/10, Best: 0/10, Worst: 7/10 Pain location: R Shoulder  Pain Quality: intermittent and stabbing  Radiating: No  Numbness/Tingling: No Focal Weakness: Yes Aggravating factors: moving the Arm, outward reaching  Relieving factors: Resting, Ice 24-hour pain behavior: AM: soreness  and stiffness   History of prior shoulder or neck/shoulder injury, pain, surgery, or therapy: Yes Dominant hand: right  PRECAUTIONS: None  WEIGHT BEARING RESTRICTIONS: No  FALLS: Has patient fallen in last 6 months? Yes. Number of falls 1 Patent reports fall forward up stairs   Living Environment Lives with: lives with their spouse Lives in: House/apartment Stairs: Yes: Internal: 13 steps; can reach both Has following equipment at home: None  Prior level of function: Independent  Occupational demands: Retired   Hobbies: Golf   Patient Goals: Pt states he would like to get back to the gym/swim as fast as possible.  OBJECTIVE:   Patient Surveys  QuickDASH:   Cognition Patient is oriented to person, place, and time.  Recent memory is intact.  Remote memory is intact.  Attention span and concentration are intact.  Expressive speech is intact.  Patient's fund of knowledge is within normal limits for educational level.    Gross Musculoskeletal Assessment Tremor: None Bulk: Normal Tone: Normal  Gait Within Normal Limits   Posture Seated: Rounded Shoulder   Cervical Screen AROM: WFL and painless with overpressure in all planes  AROM AROM (Normal range in  degrees) AROM/PROM  Cervical  Flexion (50)   Extension (80)   Right lateral flexion (45)   Left lateral flexion (45)   Right rotation (85)   Left rotation (85)    Right Left  Shoulder    Flexion Surgical Limitations/90 165  Extension Surgical Limitations   Abduction Surgical Limitations/90 150  External Rotation 60 60  Internal Rotation WNL* WNL  Hands Behind Head    Hands Behind Back        Elbow    Flexion    Extension    Pronation    Supination    (* = pain; Blank rows = not tested)  UE MMT: MMT (out of 5) Right Left   Cervical (isometric)  Flexion WNL  Extension WNL  Lateral Flexion WNL WNL  Rotation WNL WNL      Shoulder   Flexion Surgical Limitations 4  Extension    Abduction Surgical Limitations 4  External rotation 4 4  Internal rotation 4 4  Horizontal abduction    Horizontal adduction    Lower Trapezius    Rhomboids        Elbow  Flexion 5 5  Extension 4 4  Pronation 5 5  Supination 5 5      Wrist  Flexion    Extension    Radial deviation    Ulnar deviation        MCP  Flexion    Extension    Abduction    Adduction    (* = pain; Blank rows = not tested)  Sensation Grossly intact to light touch bilateral UE as determined by testing dermatomes C2-T2. Proprioception and hot/cold testing deferred on this date.  Reflexes Deferred  Palpation Location LEFT  RIGHT           Subocciptials    Cervical paraspinals  0  Upper Trapezius  1  Levator Scapulae  1  Rhomboid Major/Minor    Sternoclavicular joint    Acromioclavicular joint    Coracoid process    Long head of biceps  0  Supraspinatus  0  Infraspinatus  0  Subscapularis    Teres Minor  0  Teres Major    Pectoralis Major  0  Pectoralis Minor  0  Anterior Deltoid  0  Lateral Deltoid  0  Posterior Deltoid    Latissimus Dorsi    Sternocleidomastoid    (Blank rows = not tested) Graded on 0-4 scale (0 = no pain, 1 = pain, 2 = pain with wincing/grimacing/flinching, 3 =  pain with withdrawal, 4 = unwilling to allow palpation), (Blank rows = not tested)   Passive Accessory Intervertebral Motion Deferred  Accessory Motions/Glides Deferred   TODAY'S TREATMENT: DATE: 10/08/23  Subjective: Patient with 1/10 pain at start of the session. Patient reports continued adherence to HEP but some difficulties performing prone shoulder flexion. Pt reports he may try gentle treading in the swimming pool. No further questions or concerns.    Therapeutic Exercise (targeting parascapular muscle strength):   UBE - 5 min - 2.5 min Fwd, 2.5 min Retro - Level 6-10 for UE Warm Up, strength and muscular endurance; PT manually adjusted resistance throughout to patient's tolerance.   Omega Engineer, structural Down for parascapular strength   1 x 10, 20#  2 x 10, 25#   Shoulder Row for paracapular strengthening  2 x 10, 25#  Sidelying R GHJ ER against resistance   2 x 10, 4# DB   1 x 10, 5# DB - pain noted with internal rotation past neutral   Prone I for anterior deltoid strengthening  2 x 10  Therapeutic Activity:   AAROM Shoulder Flexion against foam roll on wall for upward reaching    1 x 10   Shoulder Flexion Serratus Activation with Resistance for reaching   2 x 10 - YTB around wrist   Prone W's with OH reach for improving strength in order to tolerate swimming stroke   3 x 10 AROM   PATIENT EDUCATION:  Education details: Exercise, HEP  Person educated: Patient Education method: Explanation, Demonstration, and Handouts Education comprehension: verbalized understanding and returned demonstration   HOME EXERCISE PROGRAM:  Access Code: [email or web address] URL: [email or web address] Date: 10/01/2023 Prepared by: Lonni Pall  Exercises - Standing Bicep Curls Neutral with Dumbbells  - 1 x daily - 3-4 x weekly - 2-3 sets - 10-12 reps - Seated Elbow Flexion with Resistance  - 1 x daily - 3-4 x weekly - 2-3 sets - 10-12 reps - Shoulder  External Rotation with Anchored Resistance  - 1 x daily - 3-4 x weekly - 2-3 sets - 10 reps - Standing Shoulder Row with Anchored Resistance  - 1 x daily - 7 x weekly - 3 sets - 10 reps - Standing Shoulder Flexion AAROM with Dowel  - 1 x daily - 7 x weekly - 2-3 sets - 15-20 reps - Standing Shoulder Abduction AAROM with Dowel  - 1 x daily - 7 x weekly - 2-3 sets - 15-20 reps - Prone Shoulder Horizontal Abduction  - 1 x daily - 7 x weekly - 2-3 sets - 10-12 reps - Prone Scapular Retraction and Row  - 1 x daily - 7 x weekly - 2-3 sets - 10-12 reps - Prone Shoulder Flexion  - 1 x daily - 7 x weekly - 2-3 sets - 10-12 reps  ASSESSMENT:  CLINICAL IMPRESSION:  Patient arrives to 10th visit warranting progress note and reassessment towards PT goals. Throughout his POC he has demonstrated significant improvements in ROM, strength and functional ability to perform iADLs. Per quickDASH (See below) patient is able to perform more tasks with minimal difficulties. He still self reports moderate difficulty with washing his back and recreational activities such as hammering, tennis etc. Significant improvements in AROM as he demonstrated  good ability to perform flexion and abd past 90 degrees without pain. His worst pain is still consistently a 7/10 NPS with dynamic motions. Pt endorsed that worst pain was noted from a swinging motion when deterring a cat. Currently POC remains appropriate and PT will continue to progress patient's ROM and strengthening exercises as tolerated. Currently patient still has deficits in GHJ ROM, strength and positional pain through internal/external rotation. Based on today's performance the patient will continue to benefit from skilled PT in order to address current deficits and maximize return to PLOF.   OBJECTIVE IMPAIRMENTS: decreased ROM, decreased strength, increased edema, and pain.   ACTIVITY LIMITATIONS: carrying, lifting, bathing, dressing, reach over head, and  hygiene/grooming  PARTICIPATION LIMITATIONS: laundry, driving, yard work, and gym, swimming  PERSONAL FACTORS: Age are also affecting patient's functional outcome.   REHAB POTENTIAL: Good  CLINICAL DECISION MAKING: Stable/uncomplicated  EVALUATION COMPLEXITY: Low   GOALS: Goals reviewed with patient? Yes  SHORT TERM GOALS: Target date: 11/19/2023  Pt will be independent with HEP to improve strength and decrease shoulder pain to improve pain-free function at home and work. Baseline: 08/20/2023: Initial HEP provided. 10/08/2023: 100% adherence  Goal status: MET   LONG TERM GOALS: Target date: 12/31/2023  Pt will demonstrate R GHJ flexion/abd AROM increase 120 in order to demonstrate improvements towards outward and overhead reaching ADLs.  Baseline: AROM restrictions based on surgical protocol; 10/08/2023: Flexion: 140, Abd: 110 Goal status: Progressing   2.  Pt will decrease worst shoulder pain by at least 3 points on the NPRS in order to demonstrate clinically significant reduction in shoulder pain. Baseline: 08/20/2023: 7/10; 10/08/2023: 7/10 NPS Goal status: Progressing   3.  Pt will decrease quick DASH score by at least 8% in order to demonstrate clinically significant reduction in disability related to shoulder pain        Baseline: 08/20/2023: 31.8 / 100 = 31.8 %; 10/08/2023: 15.9 / 100 = 15.9 % Goal status: Progressing  4. Pt will increase R/L shoulder Flex/abd strength by at least 1/2 MMT grade in order to demonstrate improvement in strength and overhead function. Baseline: 08/20/2023:  Right  Left R/L  Flexion Surgical Limitations 4 4-/4  Extension     Abduction Surgical Limitations 4 4-/4  Goal status: Progressing    PLAN: PT FREQUENCY: 1-2x/week  PT DURATION: 12 weeks  PLANNED INTERVENTIONS: Therapeutic exercises, Therapeutic activity, Neuromuscular re-education, Balance training, Gait training, Patient/Family education, Self Care, Joint mobilization,  Joint manipulation, Needling, Electrical stimulation, Spinal manipulation, Spinal mobilization, Cryotherapy, Moist heat,  Manual therapy, and Re-evaluation.  PLAN FOR NEXT SESSION: Review HEP; Progress GHJ ER/IR Isometric, elbow strengthening, R GHJ Passive ROM into abd/flex. Manual techniques prn  Lonni Pall PT, DPT Physical Therapist- [place]  10/08/2023, 11:19 AM

## 2023-10-10 ENCOUNTER — Encounter: Payer: Self-pay | Admitting: Internal Medicine

## 2023-10-13 ENCOUNTER — Ambulatory Visit: Attending: Orthopaedic Surgery

## 2023-10-13 DIAGNOSIS — M25511 Pain in right shoulder: Secondary | ICD-10-CM | POA: Diagnosis not present

## 2023-10-13 DIAGNOSIS — M6281 Muscle weakness (generalized): Secondary | ICD-10-CM | POA: Insufficient documentation

## 2023-10-13 DIAGNOSIS — M25611 Stiffness of right shoulder, not elsewhere classified: Secondary | ICD-10-CM | POA: Diagnosis not present

## 2023-10-13 NOTE — Therapy (Signed)
 OUTPATIENT PHYSICAL THERAPY SHOULDER/ELBOW TREATMENT  Patient Name: Juan Holmes MRN: [id number] DOB:[birth year], 73 y.o., male Today's Date: 10/13/2023  END OF SESSION:  PT End of Session - 10/13/23 1032     Visit Number 11    Number of Visits 25    Date for PT Re-Evaluation 11/12/23    Authorization Type [employer] Medicare # [id number]    Authorization Time Period 6/12 - 8/7    Authorization - Number of Visits 16    Progress Note Due on Visit 10    PT Start Time 1033    PT Stop Time 1115    PT Time Calculation (min) 42 min    Activity Tolerance Patient tolerated treatment well    Behavior During Therapy [facility] for tasks assessed/performed          Past Medical History:  Diagnosis Date   Allergic rhinitis due to pollen    Angiodysplasia of cecum 04/03/2015   Benign essential tremor 03/10/1996   Colon polyps 03/10/2009   adenomatous   ED (erectile dysfunction)    Prostate cancer (HCC) 03/10/2008   total prostatectomy   Renal agenesis    Past Surgical History:  Procedure Laterality Date   COLONOSCOPY     KNEE ARTHROSCOPY Right 2012   ROBOT ASSISTED LAPAROSCOPIC RADICAL PROSTATECTOMY N/A 2010   [facility]    VASECTOMY  1977   Patient Active Problem List   Diagnosis Date Noted   Paronychia of finger, left 07/27/2023   Essential hypertension, benign 06/01/2023   Rosacea 05/23/2022   Advance directive discussed with patient 11/30/2019   History of colonic polyps 04/03/2015   Angiodysplasia of cecum 04/03/2015   Hyperlipemia 08/11/2014   Alcohol dependence in remission (HCC)    Routine general medical examination at a health care facility 08/31/2012   Benign essential tremor    History of prostate cancer    Allergic rhinitis due to pollen    ED (erectile dysfunction)    Congenital single kidney     PCP: Jimmy Charlie FERNS, MD   REFERRING PROVIDER:  Genelle Standing, MD   REFERRING DIAG:  [phone] (ICD-10-CM) - Injury of right acromioclavicular joint, initial  encounter   RATIONALE FOR EVALUATION AND TREATMENT: Rehabilitation  THERAPY DIAG: Acute pain of right shoulder  Muscle weakness (generalized)  Stiffness of right shoulder, not elsewhere classified  ONSET DATE: s/p AC Joint Repair (08/13/2023)  FOLLOW-UP APPT SCHEDULED WITH REFERRING PROVIDER: Yes  08/27/2023.   SUBJECTIVE:  SUBJECTIVE STATEMENT:    Patient with chief concern of R Shoulder Pain s/p AC joint repair  PERTINENT HISTORY:   Juan Holmes is 73 y.o. male reporting to OPPT s/p AC joint repair via [facility] [employer] procedure on 08/13/2023. Patient reports that he injured [facility] joint following a fall and landing on his shoulder. Patient reports that he removed sling since surgery and is able to perform some iADLs without difficuty (I.e.donning a shirt). He has difficulty with shaving using RUE. Pain aggravated with moving into outward reaching (GHJ abd) cross body motion (GHJ add). Pain relieved with ice modalities, rest and gentle range of motion when waking in the AM. Prior to the injury, patient was very active in the community and exercised frequently. Patient participating in swimming, golf, and gym program. Patient denied chills/fever, numbness/tingling, loss of sensation.   Imaging (Per Chart Review 08/20/2023):  CLINICAL DATA:  Right shoulder pain.   EXAM: MRI OF THE RIGHT SHOULDER WITHOUT CONTRAST   TECHNIQUE: Multiplanar, multisequence MR imaging of the shoulder was performed. No intravenous contrast was administered.   COMPARISON:  Right shoulder radiographs 07/14/2023   FINDINGS: Rotator cuff: There is a small focus of fluid bright signal indicating a tiny partial-thickness midsubstance insertional tear measuring up to 3 mm in transverse dimension (coronal image 9) and 3 mm in AP dimension  (sagittal image 18) within the mid to posterior aspect of the supraspinatus tendon footprint insertion. Mild intermediate T2 signal tendinosis of the mid to posterior supraspinatus. The infraspinatus, subscapularis, and teres minor are intact.   Muscles: No rotator cuff muscle atrophy, fatty infiltration, or edema.   Biceps long head: Mild-to-moderate intermediate T2 signal and thickening tendinosis of the proximal long head of the biceps tendon.   Acromioclavicular Joint: There is again approximate 8 mm elevation of the clavicular head with respect to the acromion. Small acromioclavicular joint effusion with adjacent and possibly connected fluid within the deep aspect of the superior subcutaneous fat. Mild to moderate peripheral clavicular head degenerative osteophytes. Type II acromion. No fluid within the subacromial/subdeltoid bursa.   Glenohumeral Joint: Moderate thinning of the posterior glenoid cartilage moderate posterior subchondral cystic change. Moderate inferior medial humeral head cartilage thinning. Subchondral cysts are also seen within the anterior superior medial humeral head. No glenohumeral joint effusion.   Labrum: There is fluid bright signal indicating a chondrolabral junction tear of the posterior mid to superior glenoid (axial images 11 through 14).   Bones:  No acute fracture.   Other: None.   IMPRESSION: 1. Tiny partial-thickness midsubstance insertional tear of the mid to posterior supraspinatus tendon footprint insertion measuring only 3 mm in transverse dimension and 3 mm in AP dimension. Mild underlying mid to posterior supraspinatus tendinosis. 2. Mild-to-moderate proximal long head of the biceps tendinosis. 3. Moderate glenohumeral cartilage degenerative changes. 4. Chondrolabral junction tear of the posterior mid to superior glenoid. 5. Unchanged elevation of the clavicular head with respect to the acromion. Small acromioclavicular joint  effusion with adjacent and possibly connected fluid within the deep aspect of the superior subcutaneous fat. This is consistent with a type II [place] classification acromioclavicular joint injury.   Electronically Signed   By: Tanda Lyons M.D.   On: 07/25/2023 18:08   PAIN:  Pain Intensity: Present: 0/10, Best: 0/10, Worst: 7/10 Pain location: R Shoulder  Pain Quality: intermittent and stabbing  Radiating: No  Numbness/Tingling: No Focal Weakness: Yes Aggravating factors: moving the Arm, outward reaching  Relieving factors: Resting, Ice 24-hour pain behavior: AM: soreness  and stiffness   History of prior shoulder or neck/shoulder injury, pain, surgery, or therapy: Yes Dominant hand: right  PRECAUTIONS: None  WEIGHT BEARING RESTRICTIONS: No  FALLS: Has patient fallen in last 6 months? Yes. Number of falls 1 Patent reports fall forward up stairs   Living Environment Lives with: lives with their spouse Lives in: House/apartment Stairs: Yes: Internal: 13 steps; can reach both Has following equipment at home: None  Prior level of function: Independent  Occupational demands: Retired   Hobbies: Golf   Patient Goals: Pt states he would like to get back to the gym/swim as fast as possible.  OBJECTIVE:   Patient Surveys  QuickDASH:   Cognition Patient is oriented to person, place, and time.  Recent memory is intact.  Remote memory is intact.  Attention span and concentration are intact.  Expressive speech is intact.  Patient's fund of knowledge is within normal limits for educational level.    Gross Musculoskeletal Assessment Tremor: None Bulk: Normal Tone: Normal  Gait Within Normal Limits   Posture Seated: Rounded Shoulder   Cervical Screen AROM: WFL and painless with overpressure in all planes  AROM AROM (Normal range in degrees) AROM/PROM  Cervical  Flexion (50)   Extension (80)   Right lateral flexion (45)   Left lateral flexion (45)    Right rotation (85)   Left rotation (85)    Right Left  Shoulder    Flexion Surgical Limitations/90 165  Extension Surgical Limitations   Abduction Surgical Limitations/90 150  External Rotation 60 60  Internal Rotation WNL* WNL  Hands Behind Head    Hands Behind Back        Elbow    Flexion    Extension    Pronation    Supination    (* = pain; Blank rows = not tested)  UE MMT: MMT (out of 5) Right Left   Cervical (isometric)  Flexion WNL  Extension WNL  Lateral Flexion WNL WNL  Rotation WNL WNL      Shoulder   Flexion Surgical Limitations 4  Extension    Abduction Surgical Limitations 4  External rotation 4 4  Internal rotation 4 4  Horizontal abduction    Horizontal adduction    Lower Trapezius    Rhomboids        Elbow  Flexion 5 5  Extension 4 4  Pronation 5 5  Supination 5 5      Wrist  Flexion    Extension    Radial deviation    Ulnar deviation        MCP  Flexion    Extension    Abduction    Adduction    (* = pain; Blank rows = not tested)  Sensation Grossly intact to light touch bilateral UE as determined by testing dermatomes C2-T2. Proprioception and hot/cold testing deferred on this date.  Reflexes Deferred  Palpation Location LEFT  RIGHT           Subocciptials    Cervical paraspinals  0  Upper Trapezius  1  Levator Scapulae  1  Rhomboid Major/Minor    Sternoclavicular joint    Acromioclavicular joint    Coracoid process    Long head of biceps  0  Supraspinatus  0  Infraspinatus  0  Subscapularis    Teres Minor  0  Teres Major    Pectoralis Major  0  Pectoralis Minor  0  Anterior Deltoid  0  Lateral Deltoid  0  Posterior Deltoid    Latissimus Dorsi    Sternocleidomastoid    (Blank rows = not tested) Graded on 0-4 scale (0 = no pain, 1 = pain, 2 = pain with wincing/grimacing/flinching, 3 = pain with withdrawal, 4 = unwilling to allow palpation), (Blank rows = not tested)   Passive Accessory Intervertebral  Motion Deferred  Accessory Motions/Glides Deferred   TODAY'S TREATMENT: DATE: 10/13/23  Subjective: Patient with 0/10 pain at start of the session. Patient reports continued adherence to HEP but some difficulties performing prone shoulder flexion. 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 No further questions or concerns.    Therapeutic Exercise (targeting parascapular muscle strength):   UBE - 5 min - 2.5 min Fwd, 2.5 min Retro - Level 6-10 for UE Warm Up, strength and muscular endurance; PT manually adjusted resistance throughout to patient's tolerance.   Standing Pendulums CC/CCW 1 x 20 ea direction  Standing R GHJ ER  3  x 10 - Green TB   Standing R GHJ IR  1  x 10 - Green TB   2 x 10 - Blue TB   Sidelying R GHJ ER against resistance   2 x 10, 4# DB   1 x 10, 5# DB - pain noted with internal rotation past neutral   Cross Body Stretch   R: 30s/bout x 2 in order to improve ROM and tissue extensibility  - no pain  Therapeutic Activity:   AAROM - Shoulder Flexion Hand Slide on Stair rail    1 x 15 reps     AAROM - Shoulder Abd Hand Slide on Stair rail    1 x 15 reps  AAROM Shoulder Flexion against foam roll on wall for upward reaching    1 x 10 reps   Standing OH Reaching with DB - Pt reaches to various targets on wall called by PT  1 x 10 - 6# DB   2 x 10 - 10# DB   Walking with DB at 90/90 UE for OKC stability  2 Laps - 47' with 7# DB in RUE holding end of DB    Prone W's with OH reach for improving strength in order to tolerate swimming stroke           1 x 10 AAROM   2 x 10 AROM   PATIENT EDUCATION:  Education details: Exercise, HEP  Person educated: Patient Education method: Explanation, Demonstration, and Handouts Education comprehension: verbalized understanding and returned demonstration   HOME EXERCISE PROGRAM:  Access Code: [email or web address] URL: [email or web address] Date: 10/01/2023 Prepared  by: Lonni Pall  Exercises - Standing Bicep Curls Neutral with Dumbbells  - 1 x daily - 3-4 x weekly - 2-3 sets - 10-12 reps - Seated Elbow Flexion with Resistance  - 1 x daily - 3-4 x weekly - 2-3 sets - 10-12 reps - Shoulder External Rotation with Anchored Resistance  - 1 x daily - 3-4 x weekly - 2-3 sets - 10 reps - Standing Shoulder Row with Anchored Resistance  - 1 x daily - 7 x weekly - 3 sets - 10 reps - Standing Shoulder Flexion AAROM with Dowel  - 1 x daily - 7 x weekly - 2-3 sets - 15-20 reps - Standing Shoulder Abduction AAROM with Dowel  - 1 x daily - 7 x weekly - 2-3 sets - 15-20 reps - Prone Shoulder Horizontal Abduction  - 1 x daily - 7 x weekly - 2-3 sets -  10-12 reps - Prone Scapular Retraction and Row  - 1 x daily - 7 x weekly - 2-3 sets - 10-12 reps - Prone Shoulder Flexion  - 1 x daily - 7 x weekly - 2-3 sets - 10-12 reps  ASSESSMENT:  CLINICAL IMPRESSION: Continued PT POC focused on treatment of LUE mobility and strength. Patient tolerated progression into Phase III protocol with OH lifting. No pain endorsed with  R GHJ flexion past 120 without resistance. He tolerated OKC stability exercises while holding 7# DB without adverse effects. Great demonstration of freestyle swimming stroke in prone with a twinge near [facility] joint. No pain with R shoulder horizontal adduction in order to perform cross body stretch. Currently POC remains appropriate and PT will continue to progress patient's ROM and strengthening exercises as tolerated. Currently patient still has deficits in GHJ ROM, strength and positional pain through internal/external rotation. Based on today's performance the patient will continue to benefit from skilled PT in order to address current deficits and maximize return to PLOF.   OBJECTIVE IMPAIRMENTS: decreased ROM, decreased strength, increased edema, and pain.   ACTIVITY LIMITATIONS: carrying, lifting, bathing, dressing, reach over head, and  hygiene/grooming  PARTICIPATION LIMITATIONS: laundry, driving, yard work, and gym, swimming  PERSONAL FACTORS: Age are also affecting patient's functional outcome.   REHAB POTENTIAL: Good  CLINICAL DECISION MAKING: Stable/uncomplicated  EVALUATION COMPLEXITY: Low   GOALS: Goals reviewed with patient? Yes  SHORT TERM GOALS: Target date: 11/24/2023  Pt will be independent with HEP to improve strength and decrease shoulder pain to improve pain-free function at home and work. Baseline: 08/20/2023: Initial HEP provided. 10/08/2023: 100% adherence  Goal status: MET   LONG TERM GOALS: Target date: 01/05/2024  Pt will demonstrate R GHJ flexion/abd AROM increase 120 in order to demonstrate improvements towards outward and overhead reaching ADLs.  Baseline: AROM restrictions based on surgical protocol; 10/08/2023: Flexion: 140, Abd: 110 Goal status: Progressing   2.  Pt will decrease worst shoulder pain by at least 3 points on the NPRS in order to demonstrate clinically significant reduction in shoulder pain. Baseline: 08/20/2023: 7/10; 10/08/2023: 7/10 NPS Goal status: Progressing   3.  Pt will decrease quick DASH score by at least 8% in order to demonstrate clinically significant reduction in disability related to shoulder pain        Baseline: 08/20/2023: 31.8 / 100 = 31.8 %; 10/08/2023: 15.9 / 100 = 15.9 % Goal status: Progressing  4. Pt will increase R/L shoulder Flex/abd strength by at least 1/2 MMT grade in order to demonstrate improvement in strength and overhead function. Baseline: 08/20/2023:  Right  Left R/L  Flexion Surgical Limitations 4 4-/4  Extension     Abduction Surgical Limitations 4 4-/4  Goal status: Progressing    PLAN: PT FREQUENCY: 1-2x/week  PT DURATION: 12 weeks  PLANNED INTERVENTIONS: Therapeutic exercises, Therapeutic activity, Neuromuscular re-education, Balance training, Gait training, Patient/Family education, Self Care, Joint mobilization,  Joint manipulation, Needling, Electrical stimulation, Spinal manipulation, Spinal mobilization, Cryotherapy, Moist heat,  Manual therapy, and Re-evaluation.  PLAN FOR NEXT SESSION: Review HEP; Progress GHJ ER/IR Isometric, elbow strengthening, R GHJ Passive ROM into abd/flex. Manual techniques prn  Lonni Pall PT, DPT Physical Therapist- [place]  10/13/2023, 10:37 AM

## 2023-10-15 ENCOUNTER — Ambulatory Visit

## 2023-10-15 ENCOUNTER — Ambulatory Visit (INDEPENDENT_AMBULATORY_CARE_PROVIDER_SITE_OTHER)

## 2023-10-15 VITALS — Ht 72.0 in | Wt 180.0 lb

## 2023-10-15 DIAGNOSIS — M25611 Stiffness of right shoulder, not elsewhere classified: Secondary | ICD-10-CM

## 2023-10-15 DIAGNOSIS — M6281 Muscle weakness (generalized): Secondary | ICD-10-CM

## 2023-10-15 DIAGNOSIS — M25511 Pain in right shoulder: Secondary | ICD-10-CM

## 2023-10-15 DIAGNOSIS — Z0000 Encounter for general adult medical examination without abnormal findings: Secondary | ICD-10-CM | POA: Diagnosis not present

## 2023-10-15 NOTE — Therapy (Signed)
 OUTPATIENT PHYSICAL THERAPY SHOULDER/ELBOW TREATMENT  Patient Name: Juan Holmes MRN: [id number] DOB:02-27-1951, 73 y.o., male Today's Date: 10/15/2023  END OF SESSION:  PT End of Session - 10/15/23 1029     Visit Number 12    Number of Visits 25    Date for PT Re-Evaluation 11/12/23    Authorization Type [employer] Medicare # [id number]    Authorization Time Period 6/12 - 8/7    Authorization - Visit Number 12    Authorization - Number of Visits 16    Progress Note Due on Visit 10    PT Start Time 1027    PT Stop Time 1115    PT Time Calculation (min) 48 min    Activity Tolerance Patient tolerated treatment well    Behavior During Therapy [facility] for tasks assessed/performed          Past Medical History:  Diagnosis Date   Allergic rhinitis due to pollen    Angiodysplasia of cecum 04/03/2015   Benign essential tremor 03/10/1996   Colon polyps 03/10/2009   adenomatous   ED (erectile dysfunction)    Prostate cancer (HCC) 03/10/2008   total prostatectomy   Renal agenesis    Past Surgical History:  Procedure Laterality Date   COLONOSCOPY     KNEE ARTHROSCOPY Right 2012   ROBOT ASSISTED LAPAROSCOPIC RADICAL PROSTATECTOMY N/A 2010   [facility] urology    VASECTOMY  1977   Patient Active Problem List   Diagnosis Date Noted   Paronychia of finger, left 07/27/2023   Essential hypertension, benign 06/01/2023   Rosacea 05/23/2022   Advance directive discussed with patient 11/30/2019   History of colonic polyps 04/03/2015   Angiodysplasia of cecum 04/03/2015   Hyperlipemia 08/11/2014   Alcohol dependence in remission (HCC)    Routine general medical examination at a health care facility 08/31/2012   Benign essential tremor    History of prostate cancer    Allergic rhinitis due to pollen    ED (erectile dysfunction)    Congenital single kidney     PCP: Jimmy Charlie FERNS, MD   REFERRING PROVIDER:  Genelle Standing, MD   REFERRING DIAG:  [phone] (ICD-10-CM) - Injury of  right acromioclavicular joint, initial encounter   RATIONALE FOR EVALUATION AND TREATMENT: Rehabilitation  THERAPY DIAG: Acute pain of right shoulder  Stiffness of right shoulder, not elsewhere classified  Muscle weakness (generalized)  ONSET DATE: s/p AC Joint Repair (08/13/2023)  FOLLOW-UP APPT SCHEDULED WITH REFERRING PROVIDER: Yes  08/27/2023.   SUBJECTIVE:  SUBJECTIVE STATEMENT:    Patient with chief concern of R Shoulder Pain s/p AC joint repair  PERTINENT HISTORY:   Juan Holmes is 73 y.o. male reporting to OPPT s/p AC joint repair via [facility] procedure on 08/13/2023. Patient reports that he injured [facility] joint following a fall and landing on his shoulder. Patient reports that he removed sling since surgery and is able to perform some iADLs without difficuty (I.e.donning a shirt). He has difficulty with shaving using RUE. Pain aggravated with moving into outward reaching (GHJ abd) cross body motion (GHJ add). Pain relieved with ice modalities, rest and gentle range of motion when waking in the AM. Prior to the injury, patient was very active in the community and exercised frequently. Patient participating in swimming, golf, and gym program. Patient denied chills/fever, numbness/tingling, loss of sensation.   Imaging (Per Chart Review 08/20/2023):  CLINICAL DATA:  Right shoulder pain.   EXAM: MRI OF THE RIGHT SHOULDER WITHOUT CONTRAST   TECHNIQUE: Multiplanar, multisequence MR imaging of the shoulder was performed. No intravenous contrast was administered.   COMPARISON:  Right shoulder radiographs 07/14/2023   FINDINGS: Rotator cuff: There is a small focus of fluid bright signal indicating a tiny partial-thickness midsubstance insertional tear measuring up to 3 mm in transverse dimension  (coronal image 9) and 3 mm in AP dimension (sagittal image 18) within the mid to posterior aspect of the supraspinatus tendon footprint insertion. Mild intermediate T2 signal tendinosis of the mid to posterior supraspinatus. The infraspinatus, subscapularis, and teres minor are intact.   Muscles: No rotator cuff muscle atrophy, fatty infiltration, or edema.   Biceps long head: Mild-to-moderate intermediate T2 signal and thickening tendinosis of the proximal long head of the biceps tendon.   Acromioclavicular Joint: There is again approximate 8 mm elevation of the clavicular head with respect to the acromion. Small acromioclavicular joint effusion with adjacent and possibly connected fluid within the deep aspect of the superior subcutaneous fat. Mild to moderate peripheral clavicular head degenerative osteophytes. Type II acromion. No fluid within the subacromial/subdeltoid bursa.   Glenohumeral Joint: Moderate thinning of the posterior glenoid cartilage moderate posterior subchondral cystic change. Moderate inferior medial humeral head cartilage thinning. Subchondral cysts are also seen within the anterior superior medial humeral head. No glenohumeral joint effusion.   Labrum: There is fluid bright signal indicating a chondrolabral junction tear of the posterior mid to superior glenoid (axial images 11 through 14).   Bones:  No acute fracture.   Other: None.   IMPRESSION: 1. Tiny partial-thickness midsubstance insertional tear of the mid to posterior supraspinatus tendon footprint insertion measuring only 3 mm in transverse dimension and 3 mm in AP dimension. Mild underlying mid to posterior supraspinatus tendinosis. 2. Mild-to-moderate proximal long head of the biceps tendinosis. 3. Moderate glenohumeral cartilage degenerative changes. 4. Chondrolabral junction tear of the posterior mid to superior glenoid. 5. Unchanged elevation of the clavicular head with respect to  the acromion. Small acromioclavicular joint effusion with adjacent and possibly connected fluid within the deep aspect of the superior subcutaneous fat. This is consistent with a type II [place] classification acromioclavicular joint injury.   Electronically Signed   By: Tanda Lyons M.D.   On: 07/25/2023 18:08   PAIN:  Pain Intensity: Present: 0/10, Best: 0/10, Worst: 7/10 Pain location: R Shoulder  Pain Quality: intermittent and stabbing  Radiating: No  Numbness/Tingling: No Focal Weakness: Yes Aggravating factors: moving the Arm, outward reaching  Relieving factors: Resting, Ice 24-hour pain behavior: AM: soreness  and stiffness   History of prior shoulder or neck/shoulder injury, pain, surgery, or therapy: Yes Dominant hand: right  PRECAUTIONS: None  WEIGHT BEARING RESTRICTIONS: No  FALLS: Has patient fallen in last 6 months? Yes. Number of falls 1 Patent reports fall forward up stairs   Living Environment Lives with: lives with their spouse Lives in: House/apartment Stairs: Yes: Internal: 13 steps; can reach both Has following equipment at home: None  Prior level of function: Independent  Occupational demands: Retired   Hobbies: Golf   Patient Goals: Pt states he would like to get back to the gym/swim as fast as possible.  OBJECTIVE:   Patient Surveys  QuickDASH:   Cognition Patient is oriented to person, place, and time.  Recent memory is intact.  Remote memory is intact.  Attention span and concentration are intact.  Expressive speech is intact.  Patient's fund of knowledge is within normal limits for educational level.    Gross Musculoskeletal Assessment Tremor: None Bulk: Normal Tone: Normal  Gait Within Normal Limits   Posture Seated: Rounded Shoulder   Cervical Screen AROM: WFL and painless with overpressure in all planes  AROM AROM (Normal range in degrees) AROM/PROM  Cervical  Flexion (50)   Extension (80)   Right lateral  flexion (45)   Left lateral flexion (45)   Right rotation (85)   Left rotation (85)    Right Left  Shoulder    Flexion Surgical Limitations/90 165  Extension Surgical Limitations   Abduction Surgical Limitations/90 150  External Rotation 60 60  Internal Rotation WNL* WNL  Hands Behind Head    Hands Behind Back        Elbow    Flexion    Extension    Pronation    Supination    (* = pain; Blank rows = not tested)  UE MMT: MMT (out of 5) Right Left   Cervical (isometric)  Flexion WNL  Extension WNL  Lateral Flexion WNL WNL  Rotation WNL WNL      Shoulder   Flexion Surgical Limitations 4  Extension    Abduction Surgical Limitations 4  External rotation 4 4  Internal rotation 4 4  Horizontal abduction    Horizontal adduction    Lower Trapezius    Rhomboids        Elbow  Flexion 5 5  Extension 4 4  Pronation 5 5  Supination 5 5      Wrist  Flexion    Extension    Radial deviation    Ulnar deviation        MCP  Flexion    Extension    Abduction    Adduction    (* = pain; Blank rows = not tested)  Sensation Grossly intact to light touch bilateral UE as determined by testing dermatomes C2-T2. Proprioception and hot/cold testing deferred on this date.  Reflexes Deferred  Palpation Location LEFT  RIGHT           Subocciptials    Cervical paraspinals  0  Upper Trapezius  1  Levator Scapulae  1  Rhomboid Major/Minor    Sternoclavicular joint    Acromioclavicular joint    Coracoid process    Long head of biceps  0  Supraspinatus  0  Infraspinatus  0  Subscapularis    Teres Minor  0  Teres Major    Pectoralis Major  0  Pectoralis Minor  0  Anterior Deltoid  0  Lateral Deltoid  0  Posterior Deltoid    Latissimus Dorsi    Sternocleidomastoid    (Blank rows = not tested) Graded on 0-4 scale (0 = no pain, 1 = pain, 2 = pain with wincing/grimacing/flinching, 3 = pain with withdrawal, 4 = unwilling to allow palpation), (Blank rows = not  tested)   Passive Accessory Intervertebral Motion Deferred  Accessory Motions/Glides Deferred   TODAY'S TREATMENT: DATE: 10/15/23  Subjective: Patient with 0/10 pain at start of the session. Notable stiffness within the last couple nights due to weather changes.  No further questions or concerns.    Therapeutic Exercise (targeting parascapular muscle strength):   Standing Pendulums CC/CCW 1 x 10 ea direction  Seated Lat Pull Down   2 x 10 25# - initial set with pain; second set without pain   Seated Scapular Row   2 x 10 25#  Standing R GHJ ER   2 x 5 reps x 10s - Isometric hold   Seated Shoulder abduction against resistance   2 x 10 - 4# DB   Seated Shoulder Flexion against resistance  2 x 10 3# DB - notable clicking sensation w/o pain   Prone T AROM   2 x 10  Prone I AROM   2 x 10  Therapeutic Activity:   UBE - 5 min - 2.5 min Fwd, 2.5 min Retro - Level 6-10 for UE Warm Up, strength and muscular endurance; PT manually adjusted resistance throughout to patient's tolerance.   AAROM - Shoulder Flexion (Hands in Pillow case)    2 x 10 - Pillow case against wall    AAROM - Shoulder Flexion - Hand slide on stair rail    1 x 10    AAROM - Shoulder Abd Hand Slide on Stair rail    1 x 15 reps  TRX Shoulder Row  2 x 10   Prone W's with OH reach for improving strength in order to tolerate swimming stroke           2 x 10   PATIENT EDUCATION:  Education details: Exercise, HEP  Person educated: Patient Education method: Explanation, Demonstration, and Handouts Education comprehension: verbalized understanding and returned demonstration   HOME EXERCISE PROGRAM:  Access Code: [email or web address] URL: [email or web address] Date: 10/01/2023 Prepared by: Lonni Pall  Exercises - Standing Bicep Curls Neutral with Dumbbells  - 1 x daily - 3-4 x weekly - 2-3 sets - 10-12 reps - Seated Elbow Flexion with Resistance  - 1 x daily - 3-4 x weekly - 2-3  sets - 10-12 reps - Shoulder External Rotation with Anchored Resistance  - 1 x daily - 3-4 x weekly - 2-3 sets - 10 reps - Standing Shoulder Row with Anchored Resistance  - 1 x daily - 7 x weekly - 3 sets - 10 reps - Standing Shoulder Flexion AAROM with Dowel  - 1 x daily - 7 x weekly - 2-3 sets - 15-20 reps - Standing Shoulder Abduction AAROM with Dowel  - 1 x daily - 7 x weekly - 2-3 sets - 15-20 reps - Prone Shoulder Horizontal Abduction  - 1 x daily - 7 x weekly - 2-3 sets - 10-12 reps - Prone Scapular Retraction and Row  - 1 x daily - 7 x weekly - 2-3 sets - 10-12 reps - Prone Shoulder Flexion  - 1 x daily - 7 x weekly - 2-3 sets - 10-12 reps  ASSESSMENT:  CLINICAL IMPRESSION: Continued PT POC focused on treatment of LUE mobility and strength.  Significant improvements in symptoms following PT interventions. Pt tolerated shoulder flexion/abd against resistance today without exacerbation of shoulder pain. Patient able to mimic shoulder free style stroke in prone without assist from PT and sharp pain in the shoulder indicating improvements in scapular motor control. Currently POC remains appropriate and PT will continue to progress patient's ROM and strengthening exercises as tolerated. Currently patient still has deficits in GHJ strength and positional pain through abduction/flexion. Based on today's performance the patient will continue to benefit from skilled PT in order to address current deficits and maximize return to PLOF.   OBJECTIVE IMPAIRMENTS: decreased ROM, decreased strength, increased edema, and pain.   ACTIVITY LIMITATIONS: carrying, lifting, bathing, dressing, reach over head, and hygiene/grooming  PARTICIPATION LIMITATIONS: laundry, driving, yard work, and gym, swimming  PERSONAL FACTORS: Age are also affecting patient's functional outcome.   REHAB POTENTIAL: Good  CLINICAL DECISION MAKING: Stable/uncomplicated  EVALUATION COMPLEXITY: Low   GOALS: Goals reviewed with  patient? Yes  SHORT TERM GOALS: Target date: 11/26/2023  Pt will be independent with HEP to improve strength and decrease shoulder pain to improve pain-free function at home and work. Baseline: 08/20/2023: Initial HEP provided. 10/08/2023: 100% adherence  Goal status: MET   LONG TERM GOALS: Target date: 01/07/2024  Pt will demonstrate R GHJ flexion/abd AROM increase 120 in order to demonstrate improvements towards outward and overhead reaching ADLs.  Baseline: AROM restrictions based on surgical protocol; 10/08/2023: Flexion: 140, Abd: 110 Goal status: Progressing   2.  Pt will decrease worst shoulder pain by at least 3 points on the NPRS in order to demonstrate clinically significant reduction in shoulder pain. Baseline: 08/20/2023: 7/10; 10/08/2023: 7/10 NPS Goal status: Progressing   3.  Pt will decrease quick DASH score by at least 8% in order to demonstrate clinically significant reduction in disability related to shoulder pain        Baseline: 08/20/2023: 31.8 / 100 = 31.8 %; 10/08/2023: 15.9 / 100 = 15.9 % Goal status: Progressing  4. Pt will increase R/L shoulder Flex/abd strength by at least 1/2 MMT grade in order to demonstrate improvement in strength and overhead function. Baseline: 08/20/2023:  Right  Left R/L  Flexion Surgical Limitations 4 4-/4  Extension     Abduction Surgical Limitations 4 4-/4  Goal status: Progressing    PLAN: PT FREQUENCY: 1-2x/week  PT DURATION: 12 weeks  PLANNED INTERVENTIONS: Therapeutic exercises, Therapeutic activity, Neuromuscular re-education, Balance training, Gait training, Patient/Family education, Self Care, Joint mobilization, Joint manipulation, Needling, Electrical stimulation, Spinal manipulation, Spinal mobilization, Cryotherapy, Moist heat,  Manual therapy, and Re-evaluation.  PLAN FOR NEXT SESSION: Review HEP; Progress GHJ ER/IR Isometric, elbow strengthening, R GHJ Passive ROM into abd/flex. Manual techniques  prn  Lonni Pall PT, DPT Physical Therapist- [place]  10/15/2023, 10:30 AM

## 2023-10-15 NOTE — Patient Instructions (Signed)
 Mr. Weisinger , Thank you for taking time out of your busy schedule to complete your Annual Wellness Visit with me. I enjoyed our conversation and look forward to speaking with you again next year. I, as well as your care team,  appreciate your ongoing commitment to your health goals. Please review the following plan we discussed and let me know if I can assist you in the future. Your Game plan/ To Do List    Referrals: If you haven't heard from the office you've been referred to, please reach out to them at the phone provided.   Follow up Visits: We will see or speak with you next year for your Next Medicare AWV with our clinical staff Have you seen your provider in the last 6 months (3 months if uncontrolled diabetes)? Yes  Clinician Recommendations:  Aim for 30 minutes of exercise or brisk walking, 6-8 glasses of water, and 5 servings of fruits and vegetables each day.       This is a list of the screenings recommended for you:  Health Maintenance  Topic Date Due   Screening for Lung Cancer  04/20/2011   Flu Shot  10/09/2023   COVID-19 Vaccine (7 - 2024-25 season) 12/09/2023*   Medicare Annual Wellness Visit  10/14/2024   Colon Cancer Screening  06/18/2027   DTaP/Tdap/Td vaccine (3 - Td or Tdap) 09/19/2030   Pneumococcal Vaccine for age over 55  Completed   Hepatitis C Screening  Completed   Hepatitis B Vaccine  Aged Out   HPV Vaccine  Aged Out   Meningitis B Vaccine  Aged Out   Zoster (Shingles) Vaccine  Discontinued  *Topic was postponed. The date shown is not the original due date.    Advanced directives: (Copy Requested) Please bring a copy of your health care power of attorney and living will to the office to be added to your chart at your convenience. You can mail to [facility] [address] [place], [place] [address] or email to [email or web address] Advance Care Planning is important because it:  [x]  Makes sure you receive the medical care that is  consistent with your values, goals, and preferences  [x]  It provides guidance to your family and loved ones and reduces their decisional burden about whether or not they are making the right decisions based on your wishes.  Follow the link provided in your after visit summary or read over the paperwork we have mailed to you to help you started getting your Advance Directives in place. If you need assistance in completing these, please reach out to us  so that we can help you!

## 2023-10-15 NOTE — Progress Notes (Signed)
 Subjective:   Juan Holmes is a 73 y.o. who presents for a Medicare Wellness preventive visit.  As a reminder, Annual Wellness Visits don't include a physical exam, and some assessments may be limited, especially if this visit is performed virtually. We may recommend an in-person follow-up visit with your provider if needed.  Visit Complete: Virtual I connected with  Juan Holmes on 10/15/23 by a audio enabled telemedicine application and verified that I am speaking with the correct person using two identifiers.  Patient Location: Home  Provider Location: Office/Clinic  I discussed the limitations of evaluation and management by telemedicine. The patient expressed understanding and agreed to proceed.  Vital Signs: Because this visit was a virtual/telehealth visit, some criteria may be missing or patient reported. Any vitals not documented were not able to be obtained and vitals that have been documented are patient reported.  VideoDeclined- This patient declined Librarian, academic. Therefore the visit was completed with audio only.  Persons Participating in Visit: Patient.  AWV Questionnaire: No: Patient Medicare AWV questionnaire was not completed prior to this visit.  Cardiac Risk Factors include: advanced age (>30men, >79 women);hypertension     Objective:    Today's Vitals   10/15/23 1119  Weight: 180 lb (81.6 kg)  Height: 6' (1.829 m)   Body mass index is 24.41 kg/m.     10/15/2023   11:28 AM 09/04/2023    8:34 AM 07/14/2023    8:01 AM 06/20/2022   10:09 AM 05/30/2022   11:25 AM 06/16/2016    6:30 AM  Advanced Directives  Does Patient Have a Medical Advance Directive? Yes No Yes Yes Yes No   Type of Estate agent of [place];Living will   Living will;Healthcare Power of Attorney Living will;Healthcare Power of Attorney   Copy of Healthcare Power of Attorney in Chart? No - copy requested          Data saved with a previous  flowsheet row definition    Current Medications (verified) Outpatient Encounter Medications as of 10/15/2023  Medication Sig   propranolol  ER (INDERAL  LA) 80 MG 24 hr capsule TAKE ONE CAPSULE (80 MG TOTAL) BY MOUTH DAILY.   sildenafil  (REVATIO ) 20 MG tablet TAKE THREE TO FIVE TABLETS BY MOUTH DAILY AS NEEDED (NEED OFFICE VISIT FOR REFILLS)   metroNIDAZOLE  (FLAGYL ) 500 MG tablet Take 1 tablet (500 mg total) by mouth 2 (two) times daily. (Patient not taking: Reported on 10/15/2023)   mupirocin  ointment (BACTROBAN ) 2 % Apply 1 Application topically daily. (Patient not taking: Reported on 10/15/2023)   No facility-administered encounter medications on file as of 10/15/2023.    Allergies (verified) Clindamycin /lincomycin and Penicillins   History: Past Medical History:  Diagnosis Date   Allergic rhinitis due to pollen    Allergy    Angiodysplasia of cecum 04/03/2015   Benign essential tremor 03/10/1996   Colon polyps 03/10/2009   adenomatous   ED (erectile dysfunction)    Prostate cancer (HCC) 03/10/2008   total prostatectomy   Renal agenesis    Substance abuse [facility])    Past Surgical History:  Procedure Laterality Date   COLONOSCOPY     KNEE ARTHROSCOPY Right 03/10/2010   PROSTATE SURGERY  07/2007   ROBOT ASSISTED LAPAROSCOPIC RADICAL PROSTATECTOMY N/A 03/10/2008   [facility] urology    VASECTOMY  03/11/1975   Family History  Problem Relation Age of Onset   Cancer Mother    Heart disease Mother    Lymphoma Mother  Stroke Father    Heart disease Father    Hyperlipidemia Brother    Hypertension Brother    Atrial fibrillation Brother    Hyperlipidemia Brother    Hypertension Brother    Diabetes Brother    Hyperlipidemia Brother    Hypertension Brother    Colon cancer Neg Hx    Rectal cancer Neg Hx    Stomach cancer Neg Hx    Colon polyps Neg Hx    Social History   Socioeconomic History   Marital status: Married    Spouse name: Not on file   Number of children: 2    Years of education: Not on file   Highest education level: Bachelor's degree (e.g., BA, AB, BS)  Occupational History   Occupation: Nurse, adult    Comment: Retired   Occupation: Nurse, learning disability    Comment: Retired  Tobacco Use   Smoking status: Former    Current packs/day: 0.00    Average packs/day: 1 pack/day for 30.0 years (30.0 ttl pk-yrs)    Types: Cigarettes    Start date: 02/01/1985    Quit date: 02/02/2015    Years since quitting: 8.7   Smokeless tobacco: Never  Vaping Use   Vaping status: Never Used  Substance and Sexual Activity   Alcohol use: No    Alcohol/week: 0.0 standard drinks of alcohol   Drug use: No   Sexual activity: Yes  Other Topics Concern   Not on file  Social History Narrative   Divorced long ago   Remarried in 2022      Has living will   wife Jon should make decisions--alternate is brother Cathlyn   Would accept resuscitation   Would accept tube feeds short term--- but not if cognitively unaware   Social Drivers of Health   Financial Resource Strain: Low Risk  (10/15/2023)   Overall Financial Resource Strain (CARDIA)    Difficulty of Paying Living Expenses: Not hard at all  Food Insecurity: No Food Insecurity (10/15/2023)   Hunger Vital Sign    Worried About Running Out of Food in the Last Year: Never true    Ran Out of Food in the Last Year: Never true  Transportation Needs: No Transportation Needs (10/15/2023)   PRAPARE - Administrator, Civil Service (Medical): No    Lack of Transportation (Non-Medical): No  Physical Activity: Sufficiently Active (10/15/2023)   Exercise Vital Sign    Days of Exercise per Week: 5 days    Minutes of Exercise per Session: 60 min  Stress: No Stress Concern Present (10/15/2023)   [employer] of Occupational Health - Occupational Stress Questionnaire    Feeling of Stress: Not at all  Social Connections: Socially Integrated (10/15/2023)   Social Connection and Isolation Panel    Frequency  of Communication with Friends and Family: Twice a week    Frequency of Social Gatherings with Friends and Family: Twice a week    Attends Religious Services: More than 4 times per year    Active Member of [employer] or Organizations: Yes    Attends Engineer, structural: More than 4 times per year    Marital Status: Married    Tobacco Counseling Counseling given: Not Answered    Clinical Intake:  Pre-visit preparation completed: Yes  Pain : No/denies pain     BMI - recorded: 24.41 Nutritional Risks: None Diabetes: No  No results found for: HGBA1C   How often do you need to have someone help you when  you read instructions, pamphlets, or other written materials from your doctor or pharmacy?: 1 - Never  Interpreter Needed?: No  Comments: lives with wife Information entered by :: B.Wanza Szumski,LPN   Activities of Daily Living     10/15/2023   11:29 AM  In your present state of health, do you have any difficulty performing the following activities:  Hearing? 0  Vision? 0  Difficulty concentrating or making decisions? 0  Walking or climbing stairs? 0  Dressing or bathing? 0  Doing errands, shopping? 0  Preparing Food and eating ? N  Using the Toilet? N  In the past six months, have you accidently leaked urine? N  Do you have problems with loss of bowel control? N  Managing your Medications? N  Managing your Finances? N  Housekeeping or managing your Housekeeping? N    Patient Care Team: Jimmy Charlie FERNS, MD as PCP - General (Pediatrics) Portia Fireman, OD (Optometry)  I have updated your Care Teams any recent Medical Services you may have received from other providers in the past year.     Assessment:   This is a routine wellness examination for Juan Holmes.  Hearing/Vision screen Hearing Screening - Comments:: Pt says his hearing is good Vision Screening - Comments:: Pt says his vision is good w/glasses Dr [place]ma 3 weeks ago   Goals Addressed              This Visit's Progress    Patient Stated       I would like to lose a couple of inches around the mid-section and get back to the gym       Depression Screen     10/15/2023   11:26 AM 07/27/2023   11:56 AM 06/01/2023    9:57 AM 06/01/2023    9:27 AM 05/23/2022    9:03 AM 05/23/2022    8:38 AM 11/30/2019    8:30 AM  PHQ 2/9 Scores  PHQ - 2 Score 0 0 0 0 0 0 0    Fall Risk     10/15/2023   11:23 AM 07/27/2023   11:55 AM 06/01/2023    9:57 AM 06/01/2023    9:27 AM 05/23/2022    9:03 AM  Fall Risk   Falls in the past year? 0 1 0 0 0  Number falls in past yr: 0 0  0   Injury with Fall? 1 1  0   Comment rt shoulder fall running up steps in the rain      Risk for fall due to : No Fall Risks History of fall(s)  No Fall Risks   Follow up Education provided;Falls prevention discussed Falls evaluation completed  Falls evaluation completed     MEDICARE RISK AT HOME:  Medicare Risk at Home Any stairs in or around the home?: Yes If so, are there any without handrails?: Yes Home free of loose throw rugs in walkways, pet beds, electrical cords, etc?: Yes Adequate lighting in your home to reduce risk of falls?: Yes Life alert?: No Use of a cane, walker or w/c?: No Grab bars in the bathroom?: No Shower chair or bench in shower?: No Elevated toilet seat or a handicapped toilet?: Yes  TIMED UP AND GO:  Was the test performed?  No  Cognitive Function: 6CIT completed        10/15/2023   11:34 AM  6CIT Screen  What Year? 0 points  What month? 0 points  What time? 0 points  Count back from  20 0 points  Months in reverse 0 points  Repeat phrase 0 points  Total Score 0 points    Immunizations Immunization History  Administered Date(s) Administered   Fluad Quad(high Dose 65+) 11/24/2018, 12/03/2019, 01/12/2022, 02/02/2023   Influenza,inj,Quad PF,6+ Mos 11/17/2017   PFIZER Comirnaty(Gray Top)Covid-19 Tri-Sucrose Vaccine 11/08/2020, 02/02/2023   PFIZER(Purple  Top)SARS-COV-2 Vaccination 05/09/2019, 05/29/2019, 12/12/2019   PPD Test 03/06/2020   Pfizer(Comirnaty)Fall Seasonal Vaccine 12 years and older 01/12/2022   Pneumococcal Conjugate-13 11/17/2017   Pneumococcal Polysaccharide-23 11/24/2018   Rabies, IM 09/07/2023, 09/10/2023, 09/21/2023   Tdap 08/31/2012, 09/18/2020   Zoster, Live 08/13/2014   Zoster, Unspecified 04/16/2021, 06/15/2021    Screening Tests Health Maintenance  Topic Date Due   Lung Cancer Screening  04/20/2011   INFLUENZA VACCINE  10/09/2023   COVID-19 Vaccine (7 - 2024-25 season) 12/09/2023 (Originally 03/30/2023)   Medicare Annual Wellness (AWV)  10/14/2024   Colonoscopy  06/18/2027   DTaP/Tdap/Td (3 - Td or Tdap) 09/19/2030   Pneumococcal Vaccine: 50+ Years  Completed   Hepatitis C Screening  Completed   Hepatitis B Vaccines  Aged Out   HPV VACCINES  Aged Out   Meningococcal B Vaccine  Aged Out   Zoster Vaccines- Shingrix  Discontinued    Health Maintenance  Health Maintenance Due  Topic Date Due   Lung Cancer Screening  04/20/2011   INFLUENZA VACCINE  10/09/2023   Health Maintenance Items Addressed: None due at this time  Additional Screening:  Vision Screening: Recommended annual ophthalmology exams for early detection of glaucoma and other disorders of the eye. Would you like a referral to an eye doctor? No    Dental Screening: Recommended annual dental exams for proper oral hygiene  Community Resource Referral / Chronic Care Management: CRR required this visit?  No   CCM required this visit?  No   Plan:    I have personally reviewed and noted the following in the patient's chart:   Medical and social history Use of alcohol, tobacco or illicit drugs  Current medications and supplements including opioid prescriptions. Patient is not currently taking opioid prescriptions. Functional ability and status Nutritional status Physical activity Advanced directives List of other  physicians Hospitalizations, surgeries, and ER visits in previous 12 months Vitals Screenings to include cognitive, depression, and falls Referrals and appointments  In addition, I have reviewed and discussed with patient certain preventive protocols, quality metrics, and best practice recommendations. A written personalized care plan for preventive services as well as general preventive health recommendations were provided to patient.   Juan LITTIE Saris, LPN   03/11/7972   After Visit Summary: (MyChart) Due to this being a telephonic visit, the after visit summary with patients personalized plan was offered to patient via MyChart   Notes: Nothing significant to report at this time.

## 2023-10-20 ENCOUNTER — Ambulatory Visit

## 2023-10-20 DIAGNOSIS — M25511 Pain in right shoulder: Secondary | ICD-10-CM | POA: Diagnosis not present

## 2023-10-20 DIAGNOSIS — M25611 Stiffness of right shoulder, not elsewhere classified: Secondary | ICD-10-CM | POA: Diagnosis not present

## 2023-10-20 DIAGNOSIS — M6281 Muscle weakness (generalized): Secondary | ICD-10-CM | POA: Diagnosis not present

## 2023-10-20 NOTE — Therapy (Signed)
 OUTPATIENT PHYSICAL THERAPY SHOULDER/ELBOW TREATMENT  Patient Name: Juan Holmes MRN: [id number] DOB:Aug 25, 1950, 73 y.o., male Today's Date: 10/20/2023  END OF SESSION:  PT End of Session - 10/20/23 1027     Visit Number 13    Number of Visits 25    Date for PT Re-Evaluation 11/12/23    Authorization Type [employer] Medicare # [id number]    Authorization Time Period auth 6 visits  8/11 - 9/8    Authorization - Visit Number 1    Authorization - Number of Visits 6    Progress Note Due on Visit 20    PT Start Time 1028    PT Stop Time 1115    PT Time Calculation (min) 47 min    Activity Tolerance Patient tolerated treatment well    Behavior During Therapy [facility] for tasks assessed/performed          Past Medical History:  Diagnosis Date   Allergic rhinitis due to pollen    Allergy    Angiodysplasia of cecum 04/03/2015   Benign essential tremor 03/10/1996   Colon polyps 03/10/2009   adenomatous   ED (erectile dysfunction)    Prostate cancer (HCC) 03/10/2008   total prostatectomy   Renal agenesis    Substance abuse (HCC)    Past Surgical History:  Procedure Laterality Date   COLONOSCOPY     KNEE ARTHROSCOPY Right 03/10/2010   PROSTATE SURGERY  07/2007   ROBOT ASSISTED LAPAROSCOPIC RADICAL PROSTATECTOMY N/A 03/10/2008   [facility] urology    VASECTOMY  03/11/1975   Patient Active Problem List   Diagnosis Date Noted   Paronychia of finger, left 07/27/2023   Essential hypertension, benign 06/01/2023   Rosacea 05/23/2022   Advance directive discussed with patient 11/30/2019   History of colonic polyps 04/03/2015   Angiodysplasia of cecum 04/03/2015   Hyperlipemia 08/11/2014   Alcohol dependence in remission (HCC)    Routine general medical examination at a health care facility 08/31/2012   Benign essential tremor    History of prostate cancer    Allergic rhinitis due to pollen    ED (erectile dysfunction)    Congenital single kidney     PCP: Jimmy Charlie FERNS, MD    REFERRING PROVIDER:  Genelle Standing, MD   REFERRING DIAG:  [phone] (ICD-10-CM) - Injury of right acromioclavicular joint, initial encounter   RATIONALE FOR EVALUATION AND TREATMENT: Rehabilitation  THERAPY DIAG: Acute pain of right shoulder  Stiffness of right shoulder, not elsewhere classified  Muscle weakness (generalized)  ONSET DATE: s/p AC Joint Repair (08/13/2023)  FOLLOW-UP APPT SCHEDULED WITH REFERRING PROVIDER: Yes  08/27/2023.   SUBJECTIVE:  SUBJECTIVE STATEMENT:    Patient with chief concern of R Shoulder Pain s/p AC joint repair  PERTINENT HISTORY:   Juan Holmes is 73 y.o. male reporting to OPPT s/p AC joint repair via [facility] [place] procedure on 08/13/2023. Patient reports that he injured [facility] joint following a fall and landing on his shoulder. Patient reports that he removed sling since surgery and is able to perform some iADLs without difficuty (I.e.donning a shirt). He has difficulty with shaving using RUE. Pain aggravated with moving into outward reaching (GHJ abd) cross body motion (GHJ add). Pain relieved with ice modalities, rest and gentle range of motion when waking in the AM. Prior to the injury, patient was very active in the community and exercised frequently. Patient participating in swimming, golf, and gym program. Patient denied chills/fever, numbness/tingling, loss of sensation.   Imaging (Per Chart Review 08/20/2023):  CLINICAL DATA:  Right shoulder pain.   EXAM: MRI OF THE RIGHT SHOULDER WITHOUT CONTRAST   TECHNIQUE: Multiplanar, multisequence MR imaging of the shoulder was performed. No intravenous contrast was administered.   COMPARISON:  Right shoulder radiographs 07/14/2023   FINDINGS: Rotator cuff: There is a small focus of fluid bright signal indicating a  tiny partial-thickness midsubstance insertional tear measuring up to 3 mm in transverse dimension (coronal image 9) and 3 mm in AP dimension (sagittal image 18) within the mid to posterior aspect of the supraspinatus tendon footprint insertion. Mild intermediate T2 signal tendinosis of the mid to posterior supraspinatus. The infraspinatus, subscapularis, and teres minor are intact.   Muscles: No rotator cuff muscle atrophy, fatty infiltration, or edema.   Biceps long head: Mild-to-moderate intermediate T2 signal and thickening tendinosis of the proximal long head of the biceps tendon.   Acromioclavicular Joint: There is again approximate 8 mm elevation of the clavicular head with respect to the acromion. Small acromioclavicular joint effusion with adjacent and possibly connected fluid within the deep aspect of the superior subcutaneous fat. Mild to moderate peripheral clavicular head degenerative osteophytes. Type II acromion. No fluid within the subacromial/subdeltoid bursa.   Glenohumeral Joint: Moderate thinning of the posterior glenoid cartilage moderate posterior subchondral cystic change. Moderate inferior medial humeral head cartilage thinning. Subchondral cysts are also seen within the anterior superior medial humeral head. No glenohumeral joint effusion.   Labrum: There is fluid bright signal indicating a chondrolabral junction tear of the posterior mid to superior glenoid (axial images 11 through 14).   Bones:  No acute fracture.   Other: None.   IMPRESSION: 1. Tiny partial-thickness midsubstance insertional tear of the mid to posterior supraspinatus tendon footprint insertion measuring only 3 mm in transverse dimension and 3 mm in AP dimension. Mild underlying mid to posterior supraspinatus tendinosis. 2. Mild-to-moderate proximal long head of the biceps tendinosis. 3. Moderate glenohumeral cartilage degenerative changes. 4. Chondrolabral junction tear of the  posterior mid to superior glenoid. 5. Unchanged elevation of the clavicular head with respect to the acromion. Small acromioclavicular joint effusion with adjacent and possibly connected fluid within the deep aspect of the superior subcutaneous fat. This is consistent with a type II [place] classification acromioclavicular joint injury.   Electronically Signed   By: Tanda Lyons M.D.   On: 07/25/2023 18:08   PAIN:  Pain Intensity: Present: 0/10, Best: 0/10, Worst: 7/10 Pain location: R Shoulder  Pain Quality: intermittent and stabbing  Radiating: No  Numbness/Tingling: No Focal Weakness: Yes Aggravating factors: moving the Arm, outward reaching  Relieving factors: Resting, Ice 24-hour pain behavior: AM: soreness  and stiffness   History of prior shoulder or neck/shoulder injury, pain, surgery, or therapy: Yes Dominant hand: right  PRECAUTIONS: None  WEIGHT BEARING RESTRICTIONS: No  FALLS: Has patient fallen in last 6 months? Yes. Number of falls 1 Patent reports fall forward up stairs   Living Environment Lives with: lives with their spouse Lives in: House/apartment Stairs: Yes: Internal: 13 steps; can reach both Has following equipment at home: None  Prior level of function: Independent  Occupational demands: Retired   Hobbies: Golf   Patient Goals: Pt states he would like to get back to the gym/swim as fast as possible.  OBJECTIVE:   Patient Surveys  QuickDASH:   Cognition Patient is oriented to person, place, and time.  Recent memory is intact.  Remote memory is intact.  Attention span and concentration are intact.  Expressive speech is intact.  Patient's fund of knowledge is within normal limits for educational level.    Gross Musculoskeletal Assessment Tremor: None Bulk: Normal Tone: Normal  Gait Within Normal Limits   Posture Seated: Rounded Shoulder   Cervical Screen AROM: WFL and painless with overpressure in all planes  AROM AROM  (Normal range in degrees) AROM/PROM  Cervical  Flexion (50)   Extension (80)   Right lateral flexion (45)   Left lateral flexion (45)   Right rotation (85)   Left rotation (85)    Right Left  Shoulder    Flexion Surgical Limitations/90 165  Extension Surgical Limitations   Abduction Surgical Limitations/90 150  External Rotation 60 60  Internal Rotation WNL* WNL  Hands Behind Head    Hands Behind Back        Elbow    Flexion    Extension    Pronation    Supination    (* = pain; Blank rows = not tested)  UE MMT: MMT (out of 5) Right Left   Cervical (isometric)  Flexion WNL  Extension WNL  Lateral Flexion WNL WNL  Rotation WNL WNL      Shoulder   Flexion Surgical Limitations 4  Extension    Abduction Surgical Limitations 4  External rotation 4 4  Internal rotation 4 4  Horizontal abduction    Horizontal adduction    Lower Trapezius    Rhomboids        Elbow  Flexion 5 5  Extension 4 4  Pronation 5 5  Supination 5 5      Wrist  Flexion    Extension    Radial deviation    Ulnar deviation        MCP  Flexion    Extension    Abduction    Adduction    (* = pain; Blank rows = not tested)  Sensation Grossly intact to light touch bilateral UE as determined by testing dermatomes C2-T2. Proprioception and hot/cold testing deferred on this date.  Reflexes Deferred  Palpation Location LEFT  RIGHT           Subocciptials    Cervical paraspinals  0  Upper Trapezius  1  Levator Scapulae  1  Rhomboid Major/Minor    Sternoclavicular joint    Acromioclavicular joint    Coracoid process    Long head of biceps  0  Supraspinatus  0  Infraspinatus  0  Subscapularis    Teres Minor  0  Teres Major    Pectoralis Major  0  Pectoralis Minor  0  Anterior Deltoid  0  Lateral Deltoid  0  Posterior Deltoid    Latissimus Dorsi    Sternocleidomastoid    (Blank rows = not tested) Graded on 0-4 scale (0 = no pain, 1 = pain, 2 = pain with  wincing/grimacing/flinching, 3 = pain with withdrawal, 4 = unwilling to allow palpation), (Blank rows = not tested)   Passive Accessory Intervertebral Motion Deferred  Accessory Motions/Glides Deferred   TODAY'S TREATMENT: DATE: 10/20/23  Subjective: Patient with 0/10 pain at start of the session. Patient with continued stiffness in the R shoulder however stiffness is improved with quick AROM warm up. Patient still hasn't tried treading water however will attempt to try again this week.  No further questions or concerns.    Therapeutic Exercise (targeting parascapular muscle strength):  Seated Lat Pull Down   1 x 10 25#  3 x 10 - 35#    Seated Scapular Row   2 x 10 25#  1 x 10 35#   L Sidelying - R GHJ ER against resistance  1 x 10 - 4# DB   2 x 10 - 5# DB  Arm Circles R: 2 x 10 per CW and CCW dir  Seated Shoulder abduction (0-60) against resistance   2 x 10 - 4# DB   Prone Y   2 x 10 AROM  Prone I     1 x 10 AROM   1 x 10 AAROM   Therapeutic Activity:   UBE - 5 min - 2.5 min Fwd, 2.5 min Retro - Level 10-12 for UE Warm Up, strength and muscular endurance; PT manually adjusted resistance throughout to patient's tolerance.    AAROM - Shoulder Flexion (within tolerance) - TRX    2 x 10   TRX Shoulder Row  2 x 10 - Good demonstration of OKC shoulder stability and scapular mobility   Prone W's with OH reach for improving strength in order to tolerate swimming stroke           3 x 10 AROM - without PT assist at wrist  - Second set with improved scapular mobility and OH reaching  PATIENT EDUCATION:  Education details: Exercise, HEP  Person educated: Patient Education method: Explanation, Demonstration, and Handouts Education comprehension: verbalized understanding and returned demonstration   HOME EXERCISE PROGRAM:  Access Code: [email or web address] URL: [email or web address] Date: 10/01/2023 Prepared by: Lonni Pall  Exercises - Standing  Bicep Curls Neutral with Dumbbells  - 1 x daily - 3-4 x weekly - 2-3 sets - 10-12 reps - Seated Elbow Flexion with Resistance  - 1 x daily - 3-4 x weekly - 2-3 sets - 10-12 reps - Shoulder External Rotation with Anchored Resistance  - 1 x daily - 3-4 x weekly - 2-3 sets - 10 reps - Standing Shoulder Row with Anchored Resistance  - 1 x daily - 7 x weekly - 3 sets - 10 reps - Standing Shoulder Flexion AAROM with Dowel  - 1 x daily - 7 x weekly - 2-3 sets - 15-20 reps - Standing Shoulder Abduction AAROM with Dowel  - 1 x daily - 7 x weekly - 2-3 sets - 15-20 reps - Prone Shoulder Horizontal Abduction  - 1 x daily - 7 x weekly - 2-3 sets - 10-12 reps - Prone Scapular Retraction and Row  - 1 x daily - 7 x weekly - 2-3 sets - 10-12 reps - Prone Shoulder Flexion  - 1 x daily - 7 x weekly - 2-3 sets - 10-12 reps  ASSESSMENT:  CLINICAL IMPRESSION:  Continued PT POC focused on treatment of LUE mobility and strength. Patient tolerated increase in intensity (reps/resistances) in all PT interventions without exacerbation of R Shoulder pain. Good demonstration of performing prone swimmer's stroke actively without assistance from PT. PT educated patient on importance of rotator cuff strengthening for proper GHJ stability. He still has intermittent pain throughout specific degrees (20-50) of flexion/abduction. Additionally he still presents with significant rotator cuff/deltoid weakness in the RUE compared to the LUE. PT strongly recommends continued skilled PT in order to address deficits and maximize return to PLOF. Without PT patient at risk of further injury to R AC joint/shoulder.   OBJECTIVE IMPAIRMENTS: decreased ROM, decreased strength, increased edema, and pain.   ACTIVITY LIMITATIONS: carrying, lifting, bathing, dressing, reach over head, and hygiene/grooming  PARTICIPATION LIMITATIONS: laundry, driving, yard work, and gym, swimming  PERSONAL FACTORS: Age are also affecting patient's functional  outcome.   REHAB POTENTIAL: Good  CLINICAL DECISION MAKING: Stable/uncomplicated  EVALUATION COMPLEXITY: Low   GOALS: Goals reviewed with patient? Yes  SHORT TERM GOALS: Target date: 12/01/2023  Pt will be independent with HEP to improve strength and decrease shoulder pain to improve pain-free function at home and work. Baseline: 08/20/2023: Initial HEP provided. 10/08/2023: 100% adherence  Goal status: MET   LONG TERM GOALS: Target date: 01/12/2024  Pt will demonstrate R GHJ flexion/abd AROM increase 120 in order to demonstrate improvements towards outward and overhead reaching ADLs.  Baseline: AROM restrictions based on surgical protocol; 10/08/2023: Flexion: 140, Abd: 110 Goal status: Progressing   2.  Pt will decrease worst shoulder pain by at least 3 points on the NPRS in order to demonstrate clinically significant reduction in shoulder pain. Baseline: 08/20/2023: 7/10; 10/08/2023: 7/10 NPS Goal status: Progressing   3.  Pt will decrease quick DASH score by at least 8% in order to demonstrate clinically significant reduction in disability related to shoulder pain        Baseline: 08/20/2023: 31.8 / 100 = 31.8 %; 10/08/2023: 15.9 / 100 = 15.9 % Goal status: Progressing  4. Pt will increase R/L shoulder Flex/abd strength by at least 1/2 MMT grade in order to demonstrate improvement in strength and overhead function. Baseline: 08/20/2023:  Right  Left R/L  Flexion Surgical Limitations 4 4-/4  Extension     Abduction Surgical Limitations 4 4-/4  Goal status: Progressing    PLAN: PT FREQUENCY: 1-2x/week  PT DURATION: 12 weeks  PLANNED INTERVENTIONS: Therapeutic exercises, Therapeutic activity, Neuromuscular re-education, Balance training, Gait training, Patient/Family education, Self Care, Joint mobilization, Joint manipulation, Needling, Electrical stimulation, Spinal manipulation, Spinal mobilization, Cryotherapy, Moist heat,  Manual therapy, and  Re-evaluation.  PLAN FOR NEXT SESSION: Review HEP; Progress GHJ ER/IR Isometric, elbow strengthening, R GHJ Passive ROM into abd/flex. Manual techniques prn  Lonni Pall PT, DPT Physical Therapist- [facility]  10/20/2023, 10:31 AM

## 2023-10-22 ENCOUNTER — Ambulatory Visit

## 2023-10-22 DIAGNOSIS — M25611 Stiffness of right shoulder, not elsewhere classified: Secondary | ICD-10-CM

## 2023-10-22 DIAGNOSIS — M6281 Muscle weakness (generalized): Secondary | ICD-10-CM | POA: Diagnosis not present

## 2023-10-22 DIAGNOSIS — M25511 Pain in right shoulder: Secondary | ICD-10-CM

## 2023-10-22 NOTE — Therapy (Signed)
 OUTPATIENT PHYSICAL THERAPY SHOULDER/ELBOW TREATMENT  Patient Name: Juan Holmes MRN: [id number] DOB:[birth year], 73 y.o., male Today's Date: 10/22/2023  END OF SESSION:  PT End of Session - 10/22/23 1034     Visit Number 14    Number of Visits 25    Date for PT Re-Evaluation 11/12/23    Authorization Type [employer] Medicare # [id number]    Authorization Time Period auth 6 visits  8/11 - 9/8    Authorization - Visit Number 2    Authorization - Number of Visits 6    Progress Note Due on Visit 20    PT Start Time 1032    PT Stop Time 1115    PT Time Calculation (min) 43 min    Activity Tolerance Patient tolerated treatment well    Behavior During Therapy [facility] for tasks assessed/performed          Past Medical History:  Diagnosis Date   Allergic rhinitis due to pollen    Allergy    Angiodysplasia of cecum 04/03/2015   Benign essential tremor 03/10/1996   Colon polyps 03/10/2009   adenomatous   ED (erectile dysfunction)    Prostate cancer (HCC) 03/10/2008   total prostatectomy   Renal agenesis    Substance abuse (HCC)    Past Surgical History:  Procedure Laterality Date   COLONOSCOPY     KNEE ARTHROSCOPY Right 03/10/2010   PROSTATE SURGERY  07/2007   ROBOT ASSISTED LAPAROSCOPIC RADICAL PROSTATECTOMY N/A 03/10/2008   [facility] urology    VASECTOMY  03/11/1975   Patient Active Problem List   Diagnosis Date Noted   Paronychia of finger, left 07/27/2023   Essential hypertension, benign 06/01/2023   Rosacea 05/23/2022   Advance directive discussed with patient 11/30/2019   History of colonic polyps 04/03/2015   Angiodysplasia of cecum 04/03/2015   Hyperlipemia 08/11/2014   Alcohol dependence in remission (HCC)    Routine general medical examination at a health care facility 08/31/2012   Benign essential tremor    History of prostate cancer    Allergic rhinitis due to pollen    ED (erectile dysfunction)    Congenital single kidney     PCP: Jimmy Charlie FERNS, MD    REFERRING PROVIDER:  Genelle Standing, MD   REFERRING DIAG:  [phone] (ICD-10-CM) - Injury of right acromioclavicular joint, initial encounter   RATIONALE FOR EVALUATION AND TREATMENT: Rehabilitation  THERAPY DIAG: Acute pain of right shoulder  Stiffness of right shoulder, not elsewhere classified  Muscle weakness (generalized)  ONSET DATE: s/p AC Joint Repair (08/13/2023)  FOLLOW-UP APPT SCHEDULED WITH REFERRING PROVIDER: Yes  08/27/2023.   SUBJECTIVE:  SUBJECTIVE STATEMENT:    Patient with chief concern of R Shoulder Pain s/p AC joint repair  PERTINENT HISTORY:   Juan Holmes is 73 y.o. male reporting to OPPT s/p AC joint repair via [facility] [employer] procedure on 08/13/2023. Patient reports that he injured [facility] joint following a fall and landing on his shoulder. Patient reports that he removed sling since surgery and is able to perform some iADLs without difficuty (I.e.donning a shirt). He has difficulty with shaving using RUE. Pain aggravated with moving into outward reaching (GHJ abd) cross body motion (GHJ add). Pain relieved with ice modalities, rest and gentle range of motion when waking in the AM. Prior to the injury, patient was very active in the community and exercised frequently. Patient participating in swimming, golf, and gym program. Patient denied chills/fever, numbness/tingling, loss of sensation.   Imaging (Per Chart Review 08/20/2023):  CLINICAL DATA:  Right shoulder pain.   EXAM: MRI OF THE RIGHT SHOULDER WITHOUT CONTRAST   TECHNIQUE: Multiplanar, multisequence MR imaging of the shoulder was performed. No intravenous contrast was administered.   COMPARISON:  Right shoulder radiographs 07/14/2023   FINDINGS: Rotator cuff: There is a small focus of fluid bright signal indicating a  tiny partial-thickness midsubstance insertional tear measuring up to 3 mm in transverse dimension (coronal image 9) and 3 mm in AP dimension (sagittal image 18) within the mid to posterior aspect of the supraspinatus tendon footprint insertion. Mild intermediate T2 signal tendinosis of the mid to posterior supraspinatus. The infraspinatus, subscapularis, and teres minor are intact.   Muscles: No rotator cuff muscle atrophy, fatty infiltration, or edema.   Biceps long head: Mild-to-moderate intermediate T2 signal and thickening tendinosis of the proximal long head of the biceps tendon.   Acromioclavicular Joint: There is again approximate 8 mm elevation of the clavicular head with respect to the acromion. Small acromioclavicular joint effusion with adjacent and possibly connected fluid within the deep aspect of the superior subcutaneous fat. Mild to moderate peripheral clavicular head degenerative osteophytes. Type II acromion. No fluid within the subacromial/subdeltoid bursa.   Glenohumeral Joint: Moderate thinning of the posterior glenoid cartilage moderate posterior subchondral cystic change. Moderate inferior medial humeral head cartilage thinning. Subchondral cysts are also seen within the anterior superior medial humeral head. No glenohumeral joint effusion.   Labrum: There is fluid bright signal indicating a chondrolabral junction tear of the posterior mid to superior glenoid (axial images 11 through 14).   Bones:  No acute fracture.   Other: None.   IMPRESSION: 1. Tiny partial-thickness midsubstance insertional tear of the mid to posterior supraspinatus tendon footprint insertion measuring only 3 mm in transverse dimension and 3 mm in AP dimension. Mild underlying mid to posterior supraspinatus tendinosis. 2. Mild-to-moderate proximal long head of the biceps tendinosis. 3. Moderate glenohumeral cartilage degenerative changes. 4. Chondrolabral junction tear of the  posterior mid to superior glenoid. 5. Unchanged elevation of the clavicular head with respect to the acromion. Small acromioclavicular joint effusion with adjacent and possibly connected fluid within the deep aspect of the superior subcutaneous fat. This is consistent with a type II [place] classification acromioclavicular joint injury.   Electronically Signed   By: Tanda Lyons M.D.   On: 07/25/2023 18:08   PAIN:  Pain Intensity: Present: 0/10, Best: 0/10, Worst: 7/10 Pain location: R Shoulder  Pain Quality: intermittent and stabbing  Radiating: No  Numbness/Tingling: No Focal Weakness: Yes Aggravating factors: moving the Arm, outward reaching  Relieving factors: Resting, Ice 24-hour pain behavior: AM: soreness  and stiffness   History of prior shoulder or neck/shoulder injury, pain, surgery, or therapy: Yes Dominant hand: right  PRECAUTIONS: None  WEIGHT BEARING RESTRICTIONS: No  FALLS: Has patient fallen in last 6 months? Yes. Number of falls 1 Patent reports fall forward up stairs   Living Environment Lives with: lives with their spouse Lives in: House/apartment Stairs: Yes: Internal: 13 steps; can reach both Has following equipment at home: None  Prior level of function: Independent  Occupational demands: Retired   Hobbies: Golf   Patient Goals: Pt states he would like to get back to the gym/swim as fast as possible.  OBJECTIVE:   Patient Surveys  QuickDASH:   Cognition Patient is oriented to person, place, and time.  Recent memory is intact.  Remote memory is intact.  Attention span and concentration are intact.  Expressive speech is intact.  Patient's fund of knowledge is within normal limits for educational level.    Gross Musculoskeletal Assessment Tremor: None Bulk: Normal Tone: Normal  Gait Within Normal Limits   Posture Seated: Rounded Shoulder   Cervical Screen AROM: WFL and painless with overpressure in all planes  AROM AROM  (Normal range in degrees) AROM/PROM  Cervical  Flexion (50)   Extension (80)   Right lateral flexion (45)   Left lateral flexion (45)   Right rotation (85)   Left rotation (85)    Right Left  Shoulder    Flexion Surgical Limitations/90 165  Extension Surgical Limitations   Abduction Surgical Limitations/90 150  External Rotation 60 60  Internal Rotation WNL* WNL  Hands Behind Head    Hands Behind Back        Elbow    Flexion    Extension    Pronation    Supination    (* = pain; Blank rows = not tested)  UE MMT: MMT (out of 5) Right Left   Cervical (isometric)  Flexion WNL  Extension WNL  Lateral Flexion WNL WNL  Rotation WNL WNL      Shoulder   Flexion Surgical Limitations 4  Extension    Abduction Surgical Limitations 4  External rotation 4 4  Internal rotation 4 4  Horizontal abduction    Horizontal adduction    Lower Trapezius    Rhomboids        Elbow  Flexion 5 5  Extension 4 4  Pronation 5 5  Supination 5 5      Wrist  Flexion    Extension    Radial deviation    Ulnar deviation        MCP  Flexion    Extension    Abduction    Adduction    (* = pain; Blank rows = not tested)  Sensation Grossly intact to light touch bilateral UE as determined by testing dermatomes C2-T2. Proprioception and hot/cold testing deferred on this date.  Reflexes Deferred  Palpation Location LEFT  RIGHT           Subocciptials    Cervical paraspinals  0  Upper Trapezius  1  Levator Scapulae  1  Rhomboid Major/Minor    Sternoclavicular joint    Acromioclavicular joint    Coracoid process    Long head of biceps  0  Supraspinatus  0  Infraspinatus  0  Subscapularis    Teres Minor  0  Teres Major    Pectoralis Major  0  Pectoralis Minor  0  Anterior Deltoid  0  Lateral Deltoid  0  Posterior Deltoid    Latissimus Dorsi    Sternocleidomastoid    (Blank rows = not tested) Graded on 0-4 scale (0 = no pain, 1 = pain, 2 = pain with  wincing/grimacing/flinching, 3 = pain with withdrawal, 4 = unwilling to allow palpation), (Blank rows = not tested)   Passive Accessory Intervertebral Motion Deferred  Accessory Motions/Glides Deferred   TODAY'S TREATMENT: DATE: 10/22/23  Subjective: Patient with 0/10 pain at start of the session. Patient will attempt treading water this weekend and work/lift with lighter objects at work. Patient reports that reaching for the glove compartment with hitching pain (Scaption - y Direction).  No further questions or concerns.    Therapeutic Exercise (targeting parascapular muscle strength):  Lat Pull Down   1 x 10 - 25#   2 x 10 - 35#   Seated Chest Press  2 x 10, 25#  Seated Scaption against resistance   R: 3 x 10 - 2# DB   L Sidelying - R GHJ ER   R: 3 x 10 - 5# DB, minor pain noted with IR end range    Therapeutic Activity:   UBE - 5 min - 2.5 min Fwd, 2.5 min Retro - Level 14-20 for UE Warm Up, strength and muscular endurance; PT manually adjusted resistance throughout to patient's tolerance.    Shoulder Flexion - AROM for upward reaching and warm up    1 x 15   Shoulder Abduction - AROM for upward reaching and warm up    1 x 15    Standing Shoulder Flexion against resistance for reaching/lifting    BUE: 2 x 10 5# - no pain noted, R shoulder to 90  Prone Swimming Stroke - RUE - Freestyle Stroke   1 x 10 - AAROM at wrist from PT  2 x 10 - AROM, notable wider ROM and increased tempo     PATIENT EDUCATION:  Education details: Exercise, HEP  Person educated: Patient Education method: Explanation, Demonstration, and Handouts Education comprehension: verbalized understanding and returned demonstration   HOME EXERCISE PROGRAM:  Access Code: [email or web address] URL: [email or web address] Date: 10/01/2023 Prepared by: Lonni Pall  Exercises - Standing Bicep Curls Neutral with Dumbbells  - 1 x daily - 3-4 x weekly - 2-3 sets - 10-12 reps - Seated Elbow  Flexion with Resistance  - 1 x daily - 3-4 x weekly - 2-3 sets - 10-12 reps - Shoulder External Rotation with Anchored Resistance  - 1 x daily - 3-4 x weekly - 2-3 sets - 10 reps - Standing Shoulder Row with Anchored Resistance  - 1 x daily - 7 x weekly - 3 sets - 10 reps - Standing Shoulder Flexion AAROM with Dowel  - 1 x daily - 7 x weekly - 2-3 sets - 15-20 reps - Standing Shoulder Abduction AAROM with Dowel  - 1 x daily - 7 x weekly - 2-3 sets - 15-20 reps - Prone Shoulder Horizontal Abduction  - 1 x daily - 7 x weekly - 2-3 sets - 10-12 reps - Prone Scapular Retraction and Row  - 1 x daily - 7 x weekly - 2-3 sets - 10-12 reps - Prone Shoulder Flexion  - 1 x daily - 7 x weekly - 2-3 sets - 10-12 reps  ASSESSMENT:  CLINICAL IMPRESSION: Continued PT POC focused on improving LUE mobility and strength. He continues to increase UE strength however requires dynamic warm up focused on end ranges in order to reduce pain. Some pain noted with  initial set of lat pull down exercise but mitigated with standing shoulder flexion/abd AROM. This appt he demonstrated improvements in swimming stroke; able to perform increased scapular ROM into shoulder horizontal abd, ER into flexion without pain. He still has intermittent pain throughout specific degrees (20-50) of flexion/abduction. Additionally he is noticing pain with outward reaching in scaption direction; mitigated with multiple repetitions of resisted scapution with dumbbell. He still presents with significant rotator cuff/deltoid weakness in the RUE compared to the LUE. PT strongly recommends continued skilled PT in order to address deficits and maximize return to PLOF. Without PT patient at risk of further injury to R AC joint/shoulder.   OBJECTIVE IMPAIRMENTS: decreased ROM, decreased strength, increased edema, and pain.   ACTIVITY LIMITATIONS: carrying, lifting, bathing, dressing, reach over head, and hygiene/grooming  PARTICIPATION LIMITATIONS:  laundry, driving, yard work, and gym, swimming  PERSONAL FACTORS: Age are also affecting patient's functional outcome.   REHAB POTENTIAL: Good  CLINICAL DECISION MAKING: Stable/uncomplicated  EVALUATION COMPLEXITY: Low   GOALS: Goals reviewed with patient? Yes  SHORT TERM GOALS: Target date: 12/03/2023  Pt will be independent with HEP to improve strength and decrease shoulder pain to improve pain-free function at home and work. Baseline: 08/20/2023: Initial HEP provided. 10/08/2023: 100% adherence  Goal status: MET   LONG TERM GOALS: Target date: 01/14/2024  Pt will demonstrate R GHJ flexion/abd AROM increase 120 in order to demonstrate improvements towards outward and overhead reaching ADLs.  Baseline: AROM restrictions based on surgical protocol; 10/08/2023: Flexion: 140, Abd: 110 Goal status: Progressing   2.  Pt will decrease worst shoulder pain by at least 3 points on the NPRS in order to demonstrate clinically significant reduction in shoulder pain. Baseline: 08/20/2023: 7/10; 10/08/2023: 7/10 NPS Goal status: Progressing   3.  Pt will decrease quick DASH score by at least 8% in order to demonstrate clinically significant reduction in disability related to shoulder pain        Baseline: 08/20/2023: 31.8 / 100 = 31.8 %; 10/08/2023: 15.9 / 100 = 15.9 % Goal status: Progressing  4. Pt will increase R/L shoulder Flex/abd strength by at least 1/2 MMT grade in order to demonstrate improvement in strength and overhead function. Baseline: 08/20/2023:  Right  Left R/L  Flexion Surgical Limitations 4 4-/4  Extension     Abduction Surgical Limitations 4 4-/4  Goal status: Progressing    PLAN: PT FREQUENCY: 1-2x/week  PT DURATION: 12 weeks  PLANNED INTERVENTIONS: Therapeutic exercises, Therapeutic activity, Neuromuscular re-education, Balance training, Gait training, Patient/Family education, Self Care, Joint mobilization, Joint manipulation, Needling, Electrical  stimulation, Spinal manipulation, Spinal mobilization, Cryotherapy, Moist heat,  Manual therapy, and Re-evaluation.  PLAN FOR NEXT SESSION: Review HEP; Progress GHJ ER/IR Isometric, elbow strengthening, R GHJ Passive ROM into abd/flex. Manual techniques prn  Lonni Pall PT, DPT Physical Therapist- [place]  10/22/2023, 10:36 AM

## 2023-10-26 ENCOUNTER — Ambulatory Visit

## 2023-10-26 DIAGNOSIS — M6281 Muscle weakness (generalized): Secondary | ICD-10-CM | POA: Diagnosis not present

## 2023-10-26 DIAGNOSIS — M25511 Pain in right shoulder: Secondary | ICD-10-CM | POA: Diagnosis not present

## 2023-10-26 DIAGNOSIS — M25611 Stiffness of right shoulder, not elsewhere classified: Secondary | ICD-10-CM

## 2023-10-26 NOTE — Therapy (Signed)
 OUTPATIENT PHYSICAL THERAPY SHOULDER/ELBOW TREATMENT  Patient Name: Juan Holmes MRN: [id number] DOB:November 10, 1950, 73 y.o., male Today's Date: 10/26/2023  END OF SESSION:  PT End of Session - 10/26/23 1116     Visit Number 15    Number of Visits 25    Date for PT Re-Evaluation 11/12/23    Authorization Type [employer] Medicare # [id number]    Authorization Time Period auth 6 visits  8/11 - 9/8    Authorization - Visit Number 3    Authorization - Number of Visits 6    Progress Note Due on Visit 20    PT Start Time 1116    PT Stop Time 1157    PT Time Calculation (min) 41 min    Activity Tolerance Patient tolerated treatment well    Behavior During Therapy [facility] for tasks assessed/performed          Past Medical History:  Diagnosis Date   Allergic rhinitis due to pollen    Allergy    Angiodysplasia of cecum 04/03/2015   Benign essential tremor 03/10/1996   Colon polyps 03/10/2009   adenomatous   ED (erectile dysfunction)    Prostate cancer (HCC) 03/10/2008   total prostatectomy   Renal agenesis    Substance abuse (HCC)    Past Surgical History:  Procedure Laterality Date   COLONOSCOPY     KNEE ARTHROSCOPY Right 03/10/2010   PROSTATE SURGERY  07/2007   ROBOT ASSISTED LAPAROSCOPIC RADICAL PROSTATECTOMY N/A 03/10/2008   [facility] urology    VASECTOMY  03/11/1975   Patient Active Problem List   Diagnosis Date Noted   Paronychia of finger, left 07/27/2023   Essential hypertension, benign 06/01/2023   Rosacea 05/23/2022   Advance directive discussed with patient 11/30/2019   History of colonic polyps 04/03/2015   Angiodysplasia of cecum 04/03/2015   Hyperlipemia 08/11/2014   Alcohol dependence in remission (HCC)    Routine general medical examination at a health care facility 08/31/2012   Benign essential tremor    History of prostate cancer    Allergic rhinitis due to pollen    ED (erectile dysfunction)    Congenital single kidney     PCP: Jimmy Charlie FERNS, MD    REFERRING PROVIDER:  Genelle Standing, MD   REFERRING DIAG:  [phone] (ICD-10-CM) - Injury of right acromioclavicular joint, initial encounter   RATIONALE FOR EVALUATION AND TREATMENT: Rehabilitation  THERAPY DIAG: Acute pain of right shoulder  Stiffness of right shoulder, not elsewhere classified  Muscle weakness (generalized)  ONSET DATE: s/p AC Joint Repair (08/13/2023)  FOLLOW-UP APPT SCHEDULED WITH REFERRING PROVIDER: Yes  08/27/2023.   SUBJECTIVE:  SUBJECTIVE STATEMENT:    Patient with chief concern of R Shoulder Pain s/p AC joint repair  PERTINENT HISTORY:   Juan Holmes is 73 y.o. male reporting to OPPT s/p AC joint repair via [facility] [employer] procedure on 08/13/2023. Patient reports that he injured [facility] joint following a fall and landing on his shoulder. Patient reports that he removed sling since surgery and is able to perform some iADLs without difficuty (I.e.donning a shirt). He has difficulty with shaving using RUE. Pain aggravated with moving into outward reaching (GHJ abd) cross body motion (GHJ add). Pain relieved with ice modalities, rest and gentle range of motion when waking in the AM. Prior to the injury, patient was very active in the community and exercised frequently. Patient participating in swimming, golf, and gym program. Patient denied chills/fever, numbness/tingling, loss of sensation.   Imaging (Per Chart Review 08/20/2023):  CLINICAL DATA:  Right shoulder pain.   EXAM: MRI OF THE RIGHT SHOULDER WITHOUT CONTRAST   TECHNIQUE: Multiplanar, multisequence MR imaging of the shoulder was performed. No intravenous contrast was administered.   COMPARISON:  Right shoulder radiographs 07/14/2023   FINDINGS: Rotator cuff: There is a small focus of fluid bright signal indicating a  tiny partial-thickness midsubstance insertional tear measuring up to 3 mm in transverse dimension (coronal image 9) and 3 mm in AP dimension (sagittal image 18) within the mid to posterior aspect of the supraspinatus tendon footprint insertion. Mild intermediate T2 signal tendinosis of the mid to posterior supraspinatus. The infraspinatus, subscapularis, and teres minor are intact.   Muscles: No rotator cuff muscle atrophy, fatty infiltration, or edema.   Biceps long head: Mild-to-moderate intermediate T2 signal and thickening tendinosis of the proximal long head of the biceps tendon.   Acromioclavicular Joint: There is again approximate 8 mm elevation of the clavicular head with respect to the acromion. Small acromioclavicular joint effusion with adjacent and possibly connected fluid within the deep aspect of the superior subcutaneous fat. Mild to moderate peripheral clavicular head degenerative osteophytes. Type II acromion. No fluid within the subacromial/subdeltoid bursa.   Glenohumeral Joint: Moderate thinning of the posterior glenoid cartilage moderate posterior subchondral cystic change. Moderate inferior medial humeral head cartilage thinning. Subchondral cysts are also seen within the anterior superior medial humeral head. No glenohumeral joint effusion.   Labrum: There is fluid bright signal indicating a chondrolabral junction tear of the posterior mid to superior glenoid (axial images 11 through 14).   Bones:  No acute fracture.   Other: None.   IMPRESSION: 1. Tiny partial-thickness midsubstance insertional tear of the mid to posterior supraspinatus tendon footprint insertion measuring only 3 mm in transverse dimension and 3 mm in AP dimension. Mild underlying mid to posterior supraspinatus tendinosis. 2. Mild-to-moderate proximal long head of the biceps tendinosis. 3. Moderate glenohumeral cartilage degenerative changes. 4. Chondrolabral junction tear of the  posterior mid to superior glenoid. 5. Unchanged elevation of the clavicular head with respect to the acromion. Small acromioclavicular joint effusion with adjacent and possibly connected fluid within the deep aspect of the superior subcutaneous fat. This is consistent with a type II [place] classification acromioclavicular joint injury.   Electronically Signed   By: Tanda Lyons M.D.   On: 07/25/2023 18:08   PAIN:  Pain Intensity: Present: 0/10, Best: 0/10, Worst: 7/10 Pain location: R Shoulder  Pain Quality: intermittent and stabbing  Radiating: No  Numbness/Tingling: No Focal Weakness: Yes Aggravating factors: moving the Arm, outward reaching  Relieving factors: Resting, Ice 24-hour pain behavior: AM: soreness  and stiffness   History of prior shoulder or neck/shoulder injury, pain, surgery, or therapy: Yes Dominant hand: right  PRECAUTIONS: None  WEIGHT BEARING RESTRICTIONS: No  FALLS: Has patient fallen in last 6 months? Yes. Number of falls 1 Patent reports fall forward up stairs   Living Environment Lives with: lives with their spouse Lives in: House/apartment Stairs: Yes: Internal: 13 steps; can reach both Has following equipment at home: None  Prior level of function: Independent  Occupational demands: Retired   Hobbies: Golf   Patient Goals: Pt states he would like to get back to the gym/swim as fast as possible.  OBJECTIVE:   Patient Surveys  QuickDASH:   Cognition Patient is oriented to person, place, and time.  Recent memory is intact.  Remote memory is intact.  Attention span and concentration are intact.  Expressive speech is intact.  Patient's fund of knowledge is within normal limits for educational level.    Gross Musculoskeletal Assessment Tremor: None Bulk: Normal Tone: Normal  Gait Within Normal Limits   Posture Seated: Rounded Shoulder   Cervical Screen AROM: WFL and painless with overpressure in all planes  AROM AROM  (Normal range in degrees) AROM/PROM  Cervical  Flexion (50)   Extension (80)   Right lateral flexion (45)   Left lateral flexion (45)   Right rotation (85)   Left rotation (85)    Right Left  Shoulder    Flexion Surgical Limitations/90 165  Extension Surgical Limitations   Abduction Surgical Limitations/90 150  External Rotation 60 60  Internal Rotation WNL* WNL  Hands Behind Head    Hands Behind Back        Elbow    Flexion    Extension    Pronation    Supination    (* = pain; Blank rows = not tested)  UE MMT: MMT (out of 5) Right Left   Cervical (isometric)  Flexion WNL  Extension WNL  Lateral Flexion WNL WNL  Rotation WNL WNL      Shoulder   Flexion Surgical Limitations 4  Extension    Abduction Surgical Limitations 4  External rotation 4 4  Internal rotation 4 4  Horizontal abduction    Horizontal adduction    Lower Trapezius    Rhomboids        Elbow  Flexion 5 5  Extension 4 4  Pronation 5 5  Supination 5 5      Wrist  Flexion    Extension    Radial deviation    Ulnar deviation        MCP  Flexion    Extension    Abduction    Adduction    (* = pain; Blank rows = not tested)  Sensation Grossly intact to light touch bilateral UE as determined by testing dermatomes C2-T2. Proprioception and hot/cold testing deferred on this date.  Reflexes Deferred  Palpation Location LEFT  RIGHT           Subocciptials    Cervical paraspinals  0  Upper Trapezius  1  Levator Scapulae  1  Rhomboid Major/Minor    Sternoclavicular joint    Acromioclavicular joint    Coracoid process    Long head of biceps  0  Supraspinatus  0  Infraspinatus  0  Subscapularis    Teres Minor  0  Teres Major    Pectoralis Major  0  Pectoralis Minor  0  Anterior Deltoid  0  Lateral Deltoid  0  Posterior Deltoid    Latissimus Dorsi    Sternocleidomastoid    (Blank rows = not tested) Graded on 0-4 scale (0 = no pain, 1 = pain, 2 = pain with  wincing/grimacing/flinching, 3 = pain with withdrawal, 4 = unwilling to allow palpation), (Blank rows = not tested)   Passive Accessory Intervertebral Motion Deferred  Accessory Motions/Glides Deferred   TODAY'S TREATMENT: DATE: 10/26/23  Subjective: Patient with 0/10 pain at start of the session. Patient reports relatively same activity this past weekend; no progress with lifting overhead and unable to attempt treading water this past weekend. No further questions or concerns.    Therapeutic Exercise (targeting parascapular muscle strength):  Weighted Shoulder Pendulums   R: 2 x 10s Clockwise/CCW - 3# DB   Lat Pull Down   1 x 10 - 25#   2 x 10 - 35#   Seated Scapular Row   3 x 10 - 35#  Seated Scaption against resistance   R: 1 x 10 - 2# DB  R: 2 x 10 - 3#DB   Therapeutic Activity:   UBE - 5 min - 2.5 min Fwd, 2.5 min Retro - Level 20 for UE Warm Up, strength and muscular endurance; PT manually adjusted resistance throughout to patient's tolerance.    Foam Roller against Wall - Shoulder Flexion with Serratus activation   2 x 10     Standing OH Press with Weighted Dowel (8#)   2 x 10 - no pain with end range flexion   Standing Wall Clock against resistance (Red TB around wrist)    R: 3 x 10 - Last two sets L hand on Blue Ball   L: 3 x 10 - Last two sets R hand on Blue ball    Standing ER (Elbow at 90 of abd) with OH press    R: 2 x 10 - Red TB   Prone Swimming Stroke - RUE - Freestyle Stroke   3 x 10 - AROM good tempo     PATIENT EDUCATION:  Education details: Exercise, HEP  Person educated: Patient Education method: Explanation, Demonstration, and Handouts Education comprehension: verbalized understanding and returned demonstration   HOME EXERCISE PROGRAM:  Access Code: [email or web address] URL: [email or web address] Date: 10/01/2023 Prepared by: Lonni Pall  Exercises - Standing Bicep Curls Neutral with Dumbbells  - 1 x daily - 3-4 x weekly -  2-3 sets - 10-12 reps - Seated Elbow Flexion with Resistance  - 1 x daily - 3-4 x weekly - 2-3 sets - 10-12 reps - Shoulder External Rotation with Anchored Resistance  - 1 x daily - 3-4 x weekly - 2-3 sets - 10 reps - Standing Shoulder Row with Anchored Resistance  - 1 x daily - 7 x weekly - 3 sets - 10 reps - Standing Shoulder Flexion AAROM with Dowel  - 1 x daily - 7 x weekly - 2-3 sets - 15-20 reps - Standing Shoulder Abduction AAROM with Dowel  - 1 x daily - 7 x weekly - 2-3 sets - 15-20 reps - Prone Shoulder Horizontal Abduction  - 1 x daily - 7 x weekly - 2-3 sets - 10-12 reps - Prone Scapular Retraction and Row  - 1 x daily - 7 x weekly - 2-3 sets - 10-12 reps - Prone Shoulder Flexion  - 1 x daily - 7 x weekly - 2-3 sets - 10-12 reps  ASSESSMENT:  CLINICAL IMPRESSION: Continued PT POC focused on improving LUE mobility and strength. He continues to  increase UE strength however requires dynamic warm up focused on end ranges in order to reduce pain. Patient tolerated all activities without exacerbation of R shoulder pain. He still endorses pain with some overhead motions (1-2/10) described as a twinge through specific ranges of motion. His swimming stroke has improved but hasn't increased in tempo secondary to ROM limitations and weakness. PT focused on open chain stability of the R shoulder in order to improve suspected rotator cuff pain. He still presents with significant rotator cuff/deltoid weakness in the RUE compared to the LUE. PT strongly recommends continued skilled PT in order to address deficits and maximize return to PLOF. Without PT patient at risk of further injury to R AC joint/shoulder.   OBJECTIVE IMPAIRMENTS: decreased ROM, decreased strength, increased edema, and pain.   ACTIVITY LIMITATIONS: carrying, lifting, bathing, dressing, reach over head, and hygiene/grooming  PARTICIPATION LIMITATIONS: laundry, driving, yard work, and gym, swimming  PERSONAL FACTORS: Age are  also affecting patient's functional outcome.   REHAB POTENTIAL: Good  CLINICAL DECISION MAKING: Stable/uncomplicated  EVALUATION COMPLEXITY: Low   GOALS: Goals reviewed with patient? Yes  SHORT TERM GOALS: Target date: 12/07/2023  Pt will be independent with HEP to improve strength and decrease shoulder pain to improve pain-free function at home and work. Baseline: 08/20/2023: Initial HEP provided. 10/08/2023: 100% adherence  Goal status: MET   LONG TERM GOALS: Target date: 01/18/2024  Pt will demonstrate R GHJ flexion/abd AROM increase 120 in order to demonstrate improvements towards outward and overhead reaching ADLs.  Baseline: AROM restrictions based on surgical protocol; 10/08/2023: Flexion: 140, Abd: 110 Goal status: Progressing   2.  Pt will decrease worst shoulder pain by at least 3 points on the NPRS in order to demonstrate clinically significant reduction in shoulder pain. Baseline: 08/20/2023: 7/10; 10/08/2023: 7/10 NPS Goal status: Progressing   3.  Pt will decrease quick DASH score by at least 8% in order to demonstrate clinically significant reduction in disability related to shoulder pain        Baseline: 08/20/2023: 31.8 / 100 = 31.8 %; 10/08/2023: 15.9 / 100 = 15.9 % Goal status: Progressing  4. Pt will increase R/L shoulder Flex/abd strength by at least 1/2 MMT grade in order to demonstrate improvement in strength and overhead function. Baseline: 08/20/2023:  Right  Left R/L  Flexion Surgical Limitations 4 4-/4  Extension     Abduction Surgical Limitations 4 4-/4  Goal status: Progressing    PLAN: PT FREQUENCY: 1-2x/week  PT DURATION: 12 weeks  PLANNED INTERVENTIONS: Therapeutic exercises, Therapeutic activity, Neuromuscular re-education, Balance training, Gait training, Patient/Family education, Self Care, Joint mobilization, Joint manipulation, Needling, Electrical stimulation, Spinal manipulation, Spinal mobilization, Cryotherapy, Moist heat,   Manual therapy, and Re-evaluation.  PLAN FOR NEXT SESSION: Review HEP; Progress GHJ ER/IR Isometric, elbow strengthening, R GHJ Passive ROM into abd/flex. Manual techniques prn  Lonni Pall PT, DPT Physical Therapist- [place]  10/26/2023, 11:17 AM

## 2023-10-28 ENCOUNTER — Ambulatory Visit

## 2023-10-28 DIAGNOSIS — M25611 Stiffness of right shoulder, not elsewhere classified: Secondary | ICD-10-CM | POA: Diagnosis not present

## 2023-10-28 DIAGNOSIS — M25511 Pain in right shoulder: Secondary | ICD-10-CM | POA: Diagnosis not present

## 2023-10-28 DIAGNOSIS — M6281 Muscle weakness (generalized): Secondary | ICD-10-CM | POA: Diagnosis not present

## 2023-10-28 NOTE — Therapy (Signed)
 OUTPATIENT PHYSICAL THERAPY SHOULDER/ELBOW TREATMENT  Patient Name: Juan Holmes MRN: [id number] DOB:1951-02-27, 73 y.o., male Today's Date: 10/28/2023  END OF SESSION:  PT End of Session - 10/28/23 1028     Visit Number 16    Number of Visits 25    Date for PT Re-Evaluation 11/12/23    Authorization Type [employer] Medicare # [id number]    Authorization Time Period auth 6 visits  8/11 - 9/8    Authorization - Visit Number 4    Authorization - Number of Visits 6    Progress Note Due on Visit 20    PT Start Time 0942    PT Stop Time 1026    PT Time Calculation (min) 44 min    Activity Tolerance Patient tolerated treatment well    Behavior During Therapy [facility] for tasks assessed/performed           Past Medical History:  Diagnosis Date   Allergic rhinitis due to pollen    Allergy    Angiodysplasia of cecum 04/03/2015   Benign essential tremor 03/10/1996   Colon polyps 03/10/2009   adenomatous   ED (erectile dysfunction)    Prostate cancer (HCC) 03/10/2008   total prostatectomy   Renal agenesis    Substance abuse [facility])    Past Surgical History:  Procedure Laterality Date   COLONOSCOPY     KNEE ARTHROSCOPY Right 03/10/2010   PROSTATE SURGERY  07/2007   ROBOT ASSISTED LAPAROSCOPIC RADICAL PROSTATECTOMY N/A 03/10/2008   [facility] urology    VASECTOMY  03/11/1975   Patient Active Problem List   Diagnosis Date Noted   Paronychia of finger, left 07/27/2023   Essential hypertension, benign 06/01/2023   Rosacea 05/23/2022   Advance directive discussed with patient 11/30/2019   History of colonic polyps 04/03/2015   Angiodysplasia of cecum 04/03/2015   Hyperlipemia 08/11/2014   Alcohol dependence in remission (HCC)    Routine general medical examination at a health care facility 08/31/2012   Benign essential tremor    History of prostate cancer    Allergic rhinitis due to pollen    ED (erectile dysfunction)    Congenital single kidney     PCP: Jimmy Charlie FERNS, MD    REFERRING PROVIDER:  Genelle Standing, MD   REFERRING DIAG:  [id number] (ICD-10-CM) - Injury of right acromioclavicular joint, initial encounter   RATIONALE FOR EVALUATION AND TREATMENT: Rehabilitation  THERAPY DIAG: Acute pain of right shoulder  Stiffness of right shoulder, not elsewhere classified  Muscle weakness (generalized)  ONSET DATE: s/p AC Joint Repair (08/13/2023)  FOLLOW-UP APPT SCHEDULED WITH REFERRING PROVIDER: Yes  08/27/2023.   SUBJECTIVE:  SUBJECTIVE STATEMENT:    Patient with chief concern of R Shoulder Pain s/p AC joint repair  PERTINENT HISTORY:   Juan Holmes is 73 y.o. male reporting to OPPT s/p AC joint repair via [place] procedure on 08/13/2023. Patient reports that he injured [facility] joint following a fall and landing on his shoulder. Patient reports that he removed sling since surgery and is able to perform some iADLs without difficuty (I.e.donning a shirt). He has difficulty with shaving using RUE. Pain aggravated with moving into outward reaching (GHJ abd) cross body motion (GHJ add). Pain relieved with ice modalities, rest and gentle range of motion when waking in the AM. Prior to the injury, patient was very active in the community and exercised frequently. Patient participating in swimming, golf, and gym program. Patient denied chills/fever, numbness/tingling, loss of sensation.   Imaging (Per Chart Review 08/20/2023):  CLINICAL DATA:  Right shoulder pain.   EXAM: MRI OF THE RIGHT SHOULDER WITHOUT CONTRAST   TECHNIQUE: Multiplanar, multisequence MR imaging of the shoulder was performed. No intravenous contrast was administered.   COMPARISON:  Right shoulder radiographs 07/14/2023   FINDINGS: Rotator cuff: There is a small focus of fluid bright signal indicating a  tiny partial-thickness midsubstance insertional tear measuring up to 3 mm in transverse dimension (coronal image 9) and 3 mm in AP dimension (sagittal image 18) within the mid to posterior aspect of the supraspinatus tendon footprint insertion. Mild intermediate T2 signal tendinosis of the mid to posterior supraspinatus. The infraspinatus, subscapularis, and teres minor are intact.   Muscles: No rotator cuff muscle atrophy, fatty infiltration, or edema.   Biceps long head: Mild-to-moderate intermediate T2 signal and thickening tendinosis of the proximal long head of the biceps tendon.   Acromioclavicular Joint: There is again approximate 8 mm elevation of the clavicular head with respect to the acromion. Small acromioclavicular joint effusion with adjacent and possibly connected fluid within the deep aspect of the superior subcutaneous fat. Mild to moderate peripheral clavicular head degenerative osteophytes. Type II acromion. No fluid within the subacromial/subdeltoid bursa.   Glenohumeral Joint: Moderate thinning of the posterior glenoid cartilage moderate posterior subchondral cystic change. Moderate inferior medial humeral head cartilage thinning. Subchondral cysts are also seen within the anterior superior medial humeral head. No glenohumeral joint effusion.   Labrum: There is fluid bright signal indicating a chondrolabral junction tear of the posterior mid to superior glenoid (axial images 11 through 14).   Bones:  No acute fracture.   Other: None.   IMPRESSION: 1. Tiny partial-thickness midsubstance insertional tear of the mid to posterior supraspinatus tendon footprint insertion measuring only 3 mm in transverse dimension and 3 mm in AP dimension. Mild underlying mid to posterior supraspinatus tendinosis. 2. Mild-to-moderate proximal long head of the biceps tendinosis. 3. Moderate glenohumeral cartilage degenerative changes. 4. Chondrolabral junction tear of the  posterior mid to superior glenoid. 5. Unchanged elevation of the clavicular head with respect to the acromion. Small acromioclavicular joint effusion with adjacent and possibly connected fluid within the deep aspect of the superior subcutaneous fat. This is consistent with a type II [place] classification acromioclavicular joint injury.   Electronically Signed   By: Tanda Lyons M.D.   On: 07/25/2023 18:08   PAIN:  Pain Intensity: Present: 0/10, Best: 0/10, Worst: 7/10 Pain location: R Shoulder  Pain Quality: intermittent and stabbing  Radiating: No  Numbness/Tingling: No Focal Weakness: Yes Aggravating factors: moving the Arm, outward reaching  Relieving factors: Resting, Ice 24-hour pain behavior: AM: soreness  and stiffness   History of prior shoulder or neck/shoulder injury, pain, surgery, or therapy: Yes Dominant hand: right  PRECAUTIONS: None  WEIGHT BEARING RESTRICTIONS: No  FALLS: Has patient fallen in last 6 months? Yes. Number of falls 1 Patent reports fall forward up stairs   Living Environment Lives with: lives with their spouse Lives in: House/apartment Stairs: Yes: Internal: 13 steps; can reach both Has following equipment at home: None  Prior level of function: Independent  Occupational demands: Retired   Hobbies: Golf   Patient Goals: Pt states he would like to get back to the gym/swim as fast as possible.  OBJECTIVE:   Patient Surveys  QuickDASH:   Cognition Patient is oriented to person, place, and time.  Recent memory is intact.  Remote memory is intact.  Attention span and concentration are intact.  Expressive speech is intact.  Patient's fund of knowledge is within normal limits for educational level.    Gross Musculoskeletal Assessment Tremor: None Bulk: Normal Tone: Normal  Gait Within Normal Limits   Posture Seated: Rounded Shoulder   Cervical Screen AROM: WFL and painless with overpressure in all planes  AROM AROM  (Normal range in degrees) AROM/PROM  Cervical  Flexion (50)   Extension (80)   Right lateral flexion (45)   Left lateral flexion (45)   Right rotation (85)   Left rotation (85)    Right Left  Shoulder    Flexion Surgical Limitations/90 165  Extension Surgical Limitations   Abduction Surgical Limitations/90 150  External Rotation 60 60  Internal Rotation WNL* WNL  Hands Behind Head    Hands Behind Back        Elbow    Flexion    Extension    Pronation    Supination    (* = pain; Blank rows = not tested)  UE MMT: MMT (out of 5) Right Left   Cervical (isometric)  Flexion WNL  Extension WNL  Lateral Flexion WNL WNL  Rotation WNL WNL      Shoulder   Flexion Surgical Limitations 4  Extension    Abduction Surgical Limitations 4  External rotation 4 4  Internal rotation 4 4  Horizontal abduction    Horizontal adduction    Lower Trapezius    Rhomboids        Elbow  Flexion 5 5  Extension 4 4  Pronation 5 5  Supination 5 5      Wrist  Flexion    Extension    Radial deviation    Ulnar deviation        MCP  Flexion    Extension    Abduction    Adduction    (* = pain; Blank rows = not tested)  Sensation Grossly intact to light touch bilateral UE as determined by testing dermatomes C2-T2. Proprioception and hot/cold testing deferred on this date.  Reflexes Deferred  Palpation Location LEFT  RIGHT           Subocciptials    Cervical paraspinals  0  Upper Trapezius  1  Levator Scapulae  1  Rhomboid Major/Minor    Sternoclavicular joint    Acromioclavicular joint    Coracoid process    Long head of biceps  0  Supraspinatus  0  Infraspinatus  0  Subscapularis    Teres Minor  0  Teres Major    Pectoralis Major  0  Pectoralis Minor  0  Anterior Deltoid  0  Lateral Deltoid  0  Posterior Deltoid    Latissimus Dorsi    Sternocleidomastoid    (Blank rows = not tested) Graded on 0-4 scale (0 = no pain, 1 = pain, 2 = pain with  wincing/grimacing/flinching, 3 = pain with withdrawal, 4 = unwilling to allow palpation), (Blank rows = not tested)   Passive Accessory Intervertebral Motion Deferred  Accessory Motions/Glides Deferred   TODAY'S TREATMENT: DATE: 10/28/23  Subjective: Patient with 0/10 pain at start of the session. Patient aware of current authorization ending on 09/08; he received letter from [facility] - medicare regarding denial for additional visits. No new reports since the last PT session. No further questions or concerns.    Therapeutic Exercise (targeting parascapular muscle strength):  Seated Bent Over Scaption  2 x 10 reps   PT education on updated HEP with additional exercises:  - Shoulder External Rotation in Abduction with Anchored Resistance   - Shoulder Overhead Press in Abduction with Dumbbells   - Wall Clock with Theraband   - Doorway Pec Stretch at 90 Degrees Abduction      Therapeutic Activity:   UBE - 5 min - 2.5 min Fwd, 2.5 min Retro - Level 25- for UE Warm Up, strength and muscular endurance; PT manually adjusted resistance throughout to patient's tolerance.    Foam Roller against Wall - Shoulder Flexion with Serratus activation   2 x 15    Standing Rhythmic Stabilization at 90 deg abd   RUE into ER/IR -  30s per bout using 1 Kg Med ball    Standing ER (Elbow at 90 of abd) with OH press    R: 2 x 10 - Red TB  Waiter's Carry - RUE  for UE endurance and rotator cuff stability   R: 4 x 12' holding end of 8# DB   Shoulder Flexion with ER against resistance   2 x 10 - YTB    Standing OH Press with Weighted Dowel (8#)   2 x 10 - no pain with end range flexion   Standing Wall Clock against resistance (Red TB around wrist)    R: 2 x 8 reps @ 1, 3, 5 - YTB around wrists   L: 2 x 8 reps @ 11, 9, 7 Oclock - Red TB around wrists    Standing Pronated Elbow Flexion into OH press   1 x 10 - 8# Weighted Dowel    1 x 10 - 13# Weighted Dowel  PATIENT EDUCATION:  Education  details: Exercise, HEP  Person educated: Patient Education method: Explanation, Demonstration, and Handouts Education comprehension: verbalized understanding and returned demonstration   HOME EXERCISE PROGRAM:  Access Code: [email or web address] URL: [email or web address] Date: 10/28/2023 Prepared by: Lonni Pall  Exercises - Standing Bicep Curls Neutral with Dumbbells  - 1 x daily - 3-4 x weekly - 2-3 sets - 10-12 reps - Seated Elbow Flexion with Resistance  - 1 x daily - 3-4 x weekly - 2-3 sets - 10-12 reps - Shoulder External Rotation with Anchored Resistance  - 1 x daily - 3-4 x weekly - 2-3 sets - 10 reps - Standing Shoulder Row with Anchored Resistance  - 1 x daily - 7 x weekly - 3 sets - 10 reps - Prone Shoulder Horizontal Abduction  - 1 x daily - 7 x weekly - 2-3 sets - 10-12 reps - Prone Shoulder Flexion  - 1 x daily - 7 x weekly - 2-3 sets - 10-12 reps - Prone Single Arm Shoulder Y  - 1 x daily -  7 x weekly - 2-3 sets - 10-12 reps - Shoulder External Rotation in Abduction with Anchored Resistance  - 1 x daily - 3-4 x weekly - 2-3 sets - 10-12 reps - Shoulder Overhead Press in Abduction with Dumbbells  - 1 x daily - 3-4 x weekly - 2-3 sets - 10-12 reps - Wall Clock with Theraband  - 1 x daily - 3-4 x weekly - 2-3 sets - 8-10 reps - Doorway Pec Stretch at 90 Degrees Abduction  - 1 x daily - 7 x weekly - 3 sets - 30s hold  ASSESSMENT:  CLINICAL IMPRESSION: Continued PT POC focused on R shoulder strengthening and increased mobility without pain. Patient's pain continues to respond well to PT interventions focused on strengthening rotator cuff musculature. Today's session patient with observable difference in forearm/elbow strength between R/LUE ( R< L). Additionally patient has notable prominence in acromion process of L shoulder but without pain when directly palpated. He tolerated all activities today focused on open chain stability in abducted positions. He still presenting  with significant weakness in the RUE limiting his ability to perform heavier activities or work related task that require heavier lifting. Based on today's performance, PT strongly recommends continued physical therapy in order to improve current deficits and prevent further reinjury to [facility] joint during dynamic tasks.   OBJECTIVE IMPAIRMENTS: decreased ROM, decreased strength, increased edema, and pain.   ACTIVITY LIMITATIONS: carrying, lifting, bathing, dressing, reach over head, and hygiene/grooming  PARTICIPATION LIMITATIONS: laundry, driving, yard work, and gym, swimming  PERSONAL FACTORS: Age are also affecting patient's functional outcome.   REHAB POTENTIAL: Good  CLINICAL DECISION MAKING: Stable/uncomplicated  EVALUATION COMPLEXITY: Low   GOALS: Goals reviewed with patient? Yes  SHORT TERM GOALS: Target date: 12/09/2023  Pt will be independent with HEP to improve strength and decrease shoulder pain to improve pain-free function at home and work. Baseline: 08/20/2023: Initial HEP provided. 10/08/2023: 100% adherence  Goal status: MET   LONG TERM GOALS: Target date: 01/20/2024  Pt will demonstrate R GHJ flexion/abd AROM increase 120 in order to demonstrate improvements towards outward and overhead reaching ADLs.  Baseline: AROM restrictions based on surgical protocol; 10/08/2023: Flexion: 140, Abd: 110 Goal status: Progressing   2.  Pt will decrease worst shoulder pain by at least 3 points on the NPRS in order to demonstrate clinically significant reduction in shoulder pain. Baseline: 08/20/2023: 7/10; 10/08/2023: 7/10 NPS; 10/28/2023: 2-3/10 NPS Goal status: Progressing   3.  Pt will decrease quick DASH score by at least 8% in order to demonstrate clinically significant reduction in disability related to shoulder pain        Baseline: 08/20/2023: 31.8 / 100 = 31.8 %; 10/08/2023: 15.9 / 100 = 15.9 % Goal status: Progressing  4. Pt will increase R/L shoulder Flex/abd  strength by at least 1/2 MMT grade in order to demonstrate improvement in strength and overhead function. Baseline: 08/20/2023:  Right  Left R/L  Flexion Surgical Limitations 4 4-/4  Extension     Abduction Surgical Limitations 4 4-/4  Goal status: Progressing    PLAN: PT FREQUENCY: 1-2x/week  PT DURATION: 12 weeks  PLANNED INTERVENTIONS: Therapeutic exercises, Therapeutic activity, Neuromuscular re-education, Balance training, Gait training, Patient/Family education, Self Care, Joint mobilization, Joint manipulation, Needling, Electrical stimulation, Spinal manipulation, Spinal mobilization, Cryotherapy, Moist heat,  Manual therapy, and Re-evaluation.  PLAN FOR NEXT SESSION: Review HEP; Progress GHJ ER/IR Isometric, elbow strengthening, R GHJ Passive ROM into abd/flex. Manual techniques prn  Lonni Daley [place] PT,  DPT Physical Therapist- [place]  10/28/2023, 12:23 PM

## 2023-11-03 ENCOUNTER — Ambulatory Visit

## 2023-11-03 ENCOUNTER — Encounter

## 2023-11-03 DIAGNOSIS — M25511 Pain in right shoulder: Secondary | ICD-10-CM | POA: Diagnosis not present

## 2023-11-03 DIAGNOSIS — M25611 Stiffness of right shoulder, not elsewhere classified: Secondary | ICD-10-CM | POA: Diagnosis not present

## 2023-11-03 DIAGNOSIS — M6281 Muscle weakness (generalized): Secondary | ICD-10-CM

## 2023-11-03 NOTE — Therapy (Signed)
 OUTPATIENT PHYSICAL THERAPY SHOULDER/ELBOW TREATMENT  Patient Name: Juan Holmes MRN: [id number] DOB:[birth year], 73 y.o., male Today's Date: 11/03/2023  END OF SESSION:  PT End of Session - 11/03/23 1433     Visit Number 17    Number of Visits 25    Date for PT Re-Evaluation 11/12/23    Authorization Type [employer] Medicare # [id number]    Authorization Time Period auth 6 visits  8/11 - 9/8    Authorization - Visit Number 5    Authorization - Number of Visits 6    Progress Note Due on Visit 20    PT Start Time 1432    PT Stop Time 1510    PT Time Calculation (min) 38 min    Activity Tolerance Patient tolerated treatment well    Behavior During Therapy [facility] for tasks assessed/performed            Past Medical History:  Diagnosis Date   Allergic rhinitis due to pollen    Allergy    Angiodysplasia of cecum 04/03/2015   Benign essential tremor 03/10/1996   Colon polyps 03/10/2009   adenomatous   ED (erectile dysfunction)    Prostate cancer (HCC) 03/10/2008   total prostatectomy   Renal agenesis    Substance abuse (HCC)    Past Surgical History:  Procedure Laterality Date   COLONOSCOPY     KNEE ARTHROSCOPY Right 03/10/2010   PROSTATE SURGERY  07/2007   ROBOT ASSISTED LAPAROSCOPIC RADICAL PROSTATECTOMY N/A 03/10/2008   [facility] urology    VASECTOMY  03/11/1975   Patient Active Problem List   Diagnosis Date Noted   Paronychia of finger, left 07/27/2023   Essential hypertension, benign 06/01/2023   Rosacea 05/23/2022   Advance directive discussed with patient 11/30/2019   History of colonic polyps 04/03/2015   Angiodysplasia of cecum 04/03/2015   Hyperlipemia 08/11/2014   Alcohol dependence in remission (HCC)    Routine general medical examination at a health care facility 08/31/2012   Benign essential tremor    History of prostate cancer    Allergic rhinitis due to pollen    ED (erectile dysfunction)    Congenital single kidney     PCP: Jimmy Charlie FERNS, MD    REFERRING PROVIDER:  Genelle Standing, MD   REFERRING DIAG:  [phone] (ICD-10-CM) - Injury of right acromioclavicular joint, initial encounter   RATIONALE FOR EVALUATION AND TREATMENT: Rehabilitation  THERAPY DIAG: Acute pain of right shoulder  Stiffness of right shoulder, not elsewhere classified  Muscle weakness (generalized)  ONSET DATE: s/p AC Joint Repair (08/13/2023)  FOLLOW-UP APPT SCHEDULED WITH REFERRING PROVIDER: Yes  08/27/2023.   SUBJECTIVE:  SUBJECTIVE STATEMENT:    Patient with chief concern of R Shoulder Pain s/p AC joint repair  PERTINENT HISTORY:   Juan Holmes is 73 y.o. male reporting to OPPT s/p AC joint repair via [facility] [place] procedure on 08/13/2023. Patient reports that he injured [facility] joint following a fall and landing on his shoulder. Patient reports that he removed sling since surgery and is able to perform some iADLs without difficuty (I.e.donning a shirt). He has difficulty with shaving using RUE. Pain aggravated with moving into outward reaching (GHJ abd) cross body motion (GHJ add). Pain relieved with ice modalities, rest and gentle range of motion when waking in the AM. Prior to the injury, patient was very active in the community and exercised frequently. Patient participating in swimming, golf, and gym program. Patient denied chills/fever, numbness/tingling, loss of sensation.   Imaging (Per Chart Review 08/20/2023):  CLINICAL DATA:  Right shoulder pain.   EXAM: MRI OF THE RIGHT SHOULDER WITHOUT CONTRAST   TECHNIQUE: Multiplanar, multisequence MR imaging of the shoulder was performed. No intravenous contrast was administered.   COMPARISON:  Right shoulder radiographs 07/14/2023   FINDINGS: Rotator cuff: There is a small focus of fluid bright signal indicating a  tiny partial-thickness midsubstance insertional tear measuring up to 3 mm in transverse dimension (coronal image 9) and 3 mm in AP dimension (sagittal image 18) within the mid to posterior aspect of the supraspinatus tendon footprint insertion. Mild intermediate T2 signal tendinosis of the mid to posterior supraspinatus. The infraspinatus, subscapularis, and teres minor are intact.   Muscles: No rotator cuff muscle atrophy, fatty infiltration, or edema.   Biceps long head: Mild-to-moderate intermediate T2 signal and thickening tendinosis of the proximal long head of the biceps tendon.   Acromioclavicular Joint: There is again approximate 8 mm elevation of the clavicular head with respect to the acromion. Small acromioclavicular joint effusion with adjacent and possibly connected fluid within the deep aspect of the superior subcutaneous fat. Mild to moderate peripheral clavicular head degenerative osteophytes. Type II acromion. No fluid within the subacromial/subdeltoid bursa.   Glenohumeral Joint: Moderate thinning of the posterior glenoid cartilage moderate posterior subchondral cystic change. Moderate inferior medial humeral head cartilage thinning. Subchondral cysts are also seen within the anterior superior medial humeral head. No glenohumeral joint effusion.   Labrum: There is fluid bright signal indicating a chondrolabral junction tear of the posterior mid to superior glenoid (axial images 11 through 14).   Bones:  No acute fracture.   Other: None.   IMPRESSION: 1. Tiny partial-thickness midsubstance insertional tear of the mid to posterior supraspinatus tendon footprint insertion measuring only 3 mm in transverse dimension and 3 mm in AP dimension. Mild underlying mid to posterior supraspinatus tendinosis. 2. Mild-to-moderate proximal long head of the biceps tendinosis. 3. Moderate glenohumeral cartilage degenerative changes. 4. Chondrolabral junction tear of the  posterior mid to superior glenoid. 5. Unchanged elevation of the clavicular head with respect to the acromion. Small acromioclavicular joint effusion with adjacent and possibly connected fluid within the deep aspect of the superior subcutaneous fat. This is consistent with a type II [place] classification acromioclavicular joint injury.   Electronically Signed   By: Tanda Lyons M.D.   On: 07/25/2023 18:08   PAIN:  Pain Intensity: Present: 0/10, Best: 0/10, Worst: 7/10 Pain location: R Shoulder  Pain Quality: intermittent and stabbing  Radiating: No  Numbness/Tingling: No Focal Weakness: Yes Aggravating factors: moving the Arm, outward reaching  Relieving factors: Resting, Ice 24-hour pain behavior: AM: soreness  and stiffness   History of prior shoulder or neck/shoulder injury, pain, surgery, or therapy: Yes Dominant hand: right  PRECAUTIONS: None  WEIGHT BEARING RESTRICTIONS: No  FALLS: Has patient fallen in last 6 months? Yes. Number of falls 1 Patent reports fall forward up stairs   Living Environment Lives with: lives with their spouse Lives in: House/apartment Stairs: Yes: Internal: 13 steps; can reach both Has following equipment at home: None  Prior level of function: Independent  Occupational demands: Retired   Hobbies: Golf   Patient Goals: Pt states he would like to get back to the gym/swim as fast as possible.  OBJECTIVE:   Patient Surveys  QuickDASH:   Cognition Patient is oriented to person, place, and time.  Recent memory is intact.  Remote memory is intact.  Attention span and concentration are intact.  Expressive speech is intact.  Patient's fund of knowledge is within normal limits for educational level.    Gross Musculoskeletal Assessment Tremor: None Bulk: Normal Tone: Normal  Gait Within Normal Limits   Posture Seated: Rounded Shoulder   Cervical Screen AROM: WFL and painless with overpressure in all planes  AROM AROM  (Normal range in degrees) AROM/PROM  Cervical  Flexion (50)   Extension (80)   Right lateral flexion (45)   Left lateral flexion (45)   Right rotation (85)   Left rotation (85)    Right Left  Shoulder    Flexion Surgical Limitations/90 165  Extension Surgical Limitations   Abduction Surgical Limitations/90 150  External Rotation 60 60  Internal Rotation WNL* WNL  Hands Behind Head    Hands Behind Back        Elbow    Flexion    Extension    Pronation    Supination    (* = pain; Blank rows = not tested)  UE MMT: MMT (out of 5) Right Left   Cervical (isometric)  Flexion WNL  Extension WNL  Lateral Flexion WNL WNL  Rotation WNL WNL      Shoulder   Flexion Surgical Limitations 4  Extension    Abduction Surgical Limitations 4  External rotation 4 4  Internal rotation 4 4  Horizontal abduction    Horizontal adduction    Lower Trapezius    Rhomboids        Elbow  Flexion 5 5  Extension 4 4  Pronation 5 5  Supination 5 5      Wrist  Flexion    Extension    Radial deviation    Ulnar deviation        MCP  Flexion    Extension    Abduction    Adduction    (* = pain; Blank rows = not tested)  Sensation Grossly intact to light touch bilateral UE as determined by testing dermatomes C2-T2. Proprioception and hot/cold testing deferred on this date.  Reflexes Deferred  Palpation Location LEFT  RIGHT           Subocciptials    Cervical paraspinals  0  Upper Trapezius  1  Levator Scapulae  1  Rhomboid Major/Minor    Sternoclavicular joint    Acromioclavicular joint    Coracoid process    Long head of biceps  0  Supraspinatus  0  Infraspinatus  0  Subscapularis    Teres Minor  0  Teres Major    Pectoralis Major  0  Pectoralis Minor  0  Anterior Deltoid  0  Lateral Deltoid  0  Posterior Deltoid    Latissimus Dorsi    Sternocleidomastoid    (Blank rows = not tested) Graded on 0-4 scale (0 = no pain, 1 = pain, 2 = pain with  wincing/grimacing/flinching, 3 = pain with withdrawal, 4 = unwilling to allow palpation), (Blank rows = not tested)   Passive Accessory Intervertebral Motion Deferred  Accessory Motions/Glides Deferred   TODAY'S TREATMENT: DATE: 11/03/23  Subjective:   Patient reports 0/10 NPS pain in the R shoulder. Able to carry 30 pieces of 2 x 4 and 2 x 6 plywood with caution however no pain during the process. Patient reports that was unable to swim due to time constraint however he believes that he has the ability to perform the motion.  No questions or concerns.   Therapeutic Exercise:    Omega Cable Machine    Lat Pull Down     2 x 10- 35#    Shoulder Row     1 x 10 - 25#     1 x 10 - 35#     1 x 10 - 45#  Therapeutic Activity (focused on strengthening UE in order optimize lifting, carrying and OH reaching):   [employer]    3 x 10 - 6# DB    Standing Shoulder Flexion/Abduction AROM - Wall Slide with Pillow Case    2 x 15 ea direction    Standing Bilateral Shoulder Flexion    3 x 10 - 6# DB    Standing Bent Over Scaption against resistance   3 x 10 - 3# DB   Waiter's Carry - RUE  for UE endurance and rotator cuff stability   R: 4 x 12' holding end of 8# DB    Standing Wall Clock against resistance (Red TB around wrist)    R: 2 x 8 reps @ 1, 3, 5 - RTB around wrists   L: 2 x 8 reps @ 11, 9, 7 Oclock - Red TB around wrists    Prone Swimmers - R Arm only    R: 3 x 10 AROM - Good Tempo and without pain   PATIENT EDUCATION:  Education details: Exercise, HEP  Person educated: Patient Education method: Explanation, Demonstration, and Handouts Education comprehension: verbalized understanding and returned demonstration   HOME EXERCISE PROGRAM:  Access Code: [email or web address] URL: [email or web address] Date: 10/28/2023 Prepared by: Lonni Pall  Exercises - Standing Bicep Curls Neutral with Dumbbells  - 1 x daily - 3-4 x weekly - 2-3 sets - 10-12 reps - Seated  Elbow Flexion with Resistance  - 1 x daily - 3-4 x weekly - 2-3 sets - 10-12 reps - Shoulder External Rotation with Anchored Resistance  - 1 x daily - 3-4 x weekly - 2-3 sets - 10 reps - Standing Shoulder Row with Anchored Resistance  - 1 x daily - 7 x weekly - 3 sets - 10 reps - Prone Shoulder Horizontal Abduction  - 1 x daily - 7 x weekly - 2-3 sets - 10-12 reps - Prone Shoulder Flexion  - 1 x daily - 7 x weekly - 2-3 sets - 10-12 reps - Prone Single Arm Shoulder Y  - 1 x daily - 7 x weekly - 2-3 sets - 10-12 reps - Shoulder External Rotation in Abduction with Anchored Resistance  - 1 x daily - 3-4 x weekly - 2-3 sets - 10-12 reps - Shoulder Overhead Press in Abduction with Dumbbells  - 1 x daily - 3-4 x weekly - 2-3 sets -  10-12 reps - Wall Clock with Theraband  - 1 x daily - 3-4 x weekly - 2-3 sets - 8-10 reps - Doorway Pec Stretch at 90 Degrees Abduction  - 1 x daily - 7 x weekly - 3 sets - 30s hold  ASSESSMENT:  CLINICAL IMPRESSION: Continued PT POC focused on R shoulder strengthening and increased mobility without pain. Patient's pain consistently within minimal rating and responding well to warm up and PT interventions. Although pain has responded well, he still has strength deficits with rotator cuff musculature and tasks requiring OH lifting. PT interventions today focused on improving deltoid strength in order to optimize OH lifting without pain. NO exacerbation of pain. PT strongly recommends continued skill PT interventions in order to improve deficits in UE strength and stability in order to return back to PLOF and reduce risk of reinjury.   OBJECTIVE IMPAIRMENTS: decreased ROM, decreased strength, increased edema, and pain.   ACTIVITY LIMITATIONS: carrying, lifting, bathing, dressing, reach over head, and hygiene/grooming  PARTICIPATION LIMITATIONS: laundry, driving, yard work, and gym, swimming  PERSONAL FACTORS: Age are also affecting patient's functional outcome.   REHAB  POTENTIAL: Good  CLINICAL DECISION MAKING: Stable/uncomplicated  EVALUATION COMPLEXITY: Low   GOALS: Goals reviewed with patient? Yes  SHORT TERM GOALS: Target date: 12/15/2023  Pt will be independent with HEP to improve strength and decrease shoulder pain to improve pain-free function at home and work. Baseline: 08/20/2023: Initial HEP provided. 10/08/2023: 100% adherence  Goal status: MET   LONG TERM GOALS: Target date: 01/26/2024  Pt will demonstrate R GHJ flexion/abd AROM increase 120 in order to demonstrate improvements towards outward and overhead reaching ADLs.  Baseline: AROM restrictions based on surgical protocol; 10/08/2023: Flexion: 140, Abd: 110 Goal status: Progressing   2.  Pt will decrease worst shoulder pain by at least 3 points on the NPRS in order to demonstrate clinically significant reduction in shoulder pain. Baseline: 08/20/2023: 7/10; 10/08/2023: 7/10 NPS; 10/28/2023: 2-3/10 NPS; 11/03/2023: 1-2/10 NPS Goal status: Progressing   3.  Pt will decrease quick DASH score by at least 8% in order to demonstrate clinically significant reduction in disability related to shoulder pain        Baseline: 08/20/2023: 31.8 / 100 = 31.8 %; 10/08/2023: 15.9 / 100 = 15.9 % Goal status: Progressing  4. Pt will increase R/L shoulder Flex/abd strength by at least 1/2 MMT grade in order to demonstrate improvement in strength and overhead function. Baseline: 08/20/2023:  Right  Left R/L  Flexion Surgical Limitations 4 4-/4  Extension     Abduction Surgical Limitations 4 4-/4  Goal status: Progressing    PLAN: PT FREQUENCY: 1/week  PT DURATION: 6 weeks  PLANNED INTERVENTIONS: Therapeutic exercises, Therapeutic activity, Neuromuscular re-education, Balance training, Gait training, Patient/Family education, Self Care, Joint mobilization, Joint manipulation, Needling, Electrical stimulation, Spinal manipulation, Spinal mobilization, Cryotherapy, Moist heat,  Manual  therapy, and Re-evaluation.  PLAN FOR NEXT SESSION: Review HEP; Progress GHJ ER/IR Isometric, elbow strengthening, R GHJ Passive ROM into abd/flex. Manual techniques prn  Lonni Pall PT, DPT Physical Therapist- [place]  11/03/2023, 3:44 PM

## 2023-11-04 ENCOUNTER — Ambulatory Visit (HOSPITAL_BASED_OUTPATIENT_CLINIC_OR_DEPARTMENT_OTHER): Admitting: Orthopaedic Surgery

## 2023-11-04 DIAGNOSIS — S4991XA Unspecified injury of right shoulder and upper arm, initial encounter: Secondary | ICD-10-CM

## 2023-11-04 NOTE — Progress Notes (Signed)
 Post Operative Evaluation    Procedure/Date of Surgery: Right shoulder AC repair 6/5  Interval History:   Presents 12 weeks status post the above procedure.  He does occasionally have some pain in the shoulder with a click although this is overall mild and continues to improve.  He is working well with physical therapy  PMH/PSH/Family History/Social History/Meds/Allergies:    Past Medical History:  Diagnosis Date   Allergic rhinitis due to pollen    Allergy    Angiodysplasia of cecum 04/03/2015   Benign essential tremor 03/10/1996   Colon polyps 03/10/2009   adenomatous   ED (erectile dysfunction)    Prostate cancer (HCC) 03/10/2008   total prostatectomy   Renal agenesis    Substance abuse [facility])    Past Surgical History:  Procedure Laterality Date   COLONOSCOPY     KNEE ARTHROSCOPY Right 03/10/2010   PROSTATE SURGERY  07/2007   ROBOT ASSISTED LAPAROSCOPIC RADICAL PROSTATECTOMY N/A 03/10/2008   [facility] urology    VASECTOMY  03/11/1975   Social History   Socioeconomic History   Marital status: Married    Spouse name: Not on file   Number of children: 2   Years of education: Not on file   Highest education level: Bachelor's degree (e.g., BA, AB, BS)  Occupational History   Occupation: Nurse, adult    Comment: Retired   Occupation: Nurse, learning disability    Comment: Retired  Tobacco Use   Smoking status: Former    Current packs/day: 0.00    Average packs/day: 1 pack/day for 30.0 years (30.0 ttl pk-yrs)    Types: Cigarettes    Start date: 02/01/1985    Quit date: 02/02/2015    Years since quitting: 8.7   Smokeless tobacco: Never  Vaping Use   Vaping status: Never Used  Substance and Sexual Activity   Alcohol use: No    Alcohol/week: 0.0 standard drinks of alcohol   Drug use: No   Sexual activity: Yes  Other Topics Concern   Not on file  Social History Narrative   Divorced long ago   Remarried in 2022      Has  living will   wife Jon should make decisions--alternate is brother Cathlyn   Would accept resuscitation   Would accept tube feeds short term--- but not if cognitively unaware   Social Drivers of Health   Financial Resource Strain: Low Risk  (10/15/2023)   Overall Financial Resource Strain (CARDIA)    Difficulty of Paying Living Expenses: Not hard at all  Food Insecurity: No Food Insecurity (10/15/2023)   Hunger Vital Sign    Worried About Running Out of Food in the Last Year: Never true    Ran Out of Food in the Last Year: Never true  Transportation Needs: No Transportation Needs (10/15/2023)   PRAPARE - Administrator, Civil Service (Medical): No    Lack of Transportation (Non-Medical): No  Physical Activity: Sufficiently Active (10/15/2023)   Exercise Vital Sign    Days of Exercise per Week: 5 days    Minutes of Exercise per Session: 60 min  Stress: No Stress Concern Present (10/15/2023)   [employer] of Occupational Health - Occupational Stress Questionnaire    Feeling of Stress: Not at all  Social Connections: Socially Integrated (10/15/2023)   Social Connection and  Isolation Panel    Frequency of Communication with Friends and Family: Twice a week    Frequency of Social Gatherings with Friends and Family: Twice a week    Attends Religious Services: More than 4 times per year    Active Member of [employer] or Organizations: Yes    Attends Engineer, structural: More than 4 times per year    Marital Status: Married   Family History  Problem Relation Age of Onset   Cancer Mother    Heart disease Mother    Lymphoma Mother    Stroke Father    Heart disease Father    Hyperlipidemia Brother    Hypertension Brother    Atrial fibrillation Brother    Hyperlipidemia Brother    Hypertension Brother    Diabetes Brother    Hyperlipidemia Brother    Hypertension Brother    Colon cancer Neg Hx    Rectal cancer Neg Hx    Stomach cancer Neg Hx    Colon polyps Neg Hx     Allergies  Allergen Reactions   Clindamycin /Lincomycin Diarrhea    Got C diff   Penicillins Hives   Current Outpatient Medications  Medication Sig Dispense Refill   metroNIDAZOLE  (FLAGYL ) 500 MG tablet Take 1 tablet (500 mg total) by mouth 2 (two) times daily. (Patient not taking: Reported on 10/15/2023) 20 tablet 0   mupirocin  ointment (BACTROBAN ) 2 % Apply 1 Application topically daily. (Patient not taking: Reported on 10/15/2023) 22 g 0   propranolol  ER (INDERAL  LA) 80 MG 24 hr capsule TAKE ONE CAPSULE (80 MG TOTAL) BY MOUTH DAILY. 90 capsule 3   sildenafil  (REVATIO ) 20 MG tablet TAKE THREE TO FIVE TABLETS BY MOUTH DAILY AS NEEDED (NEED OFFICE VISIT FOR REFILLS) 50 tablet 11   No current facility-administered medications for this visit.   No results found.  Review of Systems:   A ROS was performed including pertinent positives and negatives as documented in the HPI.   Musculoskeletal Exam:    There were no vitals taken for this visit.  Right shoulder incisions are well-appearing.  Active forward elevation is to 150 degrees again with mild hitch.  External rotation at side is 45 degrees.  Ankle is improving  Imaging:      I personally reviewed and interpreted the radiographs.   Assessment:   Status post right shoulder AC joint repair doing extremely well.  He is continuing to improve nicely.  At this time he has only mild residual symptoms which are improving.  I will plan to see him back as needed.  He will discontinue physical therapy I at this time  Plan :    - Return to clinic as needed      I personally saw and evaluated the patient, and participated in the management and treatment plan.  Elspeth Parker, MD Attending Physician, Orthopedic Surgery  This document was dictated using Dragon voice recognition software. A reasonable attempt at proof reading has been made to minimize errors.

## 2023-11-05 ENCOUNTER — Ambulatory Visit

## 2023-11-10 ENCOUNTER — Ambulatory Visit

## 2023-11-12 ENCOUNTER — Ambulatory Visit

## 2023-11-17 ENCOUNTER — Ambulatory Visit

## 2023-11-19 ENCOUNTER — Ambulatory Visit

## 2023-11-24 ENCOUNTER — Encounter

## 2023-11-26 ENCOUNTER — Encounter

## 2023-12-01 ENCOUNTER — Encounter

## 2023-12-03 ENCOUNTER — Encounter

## 2023-12-08 ENCOUNTER — Encounter

## 2023-12-09 ENCOUNTER — Ambulatory Visit: Payer: Medicare Other | Admitting: Dermatology

## 2023-12-09 DIAGNOSIS — L03012 Cellulitis of left finger: Secondary | ICD-10-CM

## 2023-12-09 DIAGNOSIS — Z7189 Other specified counseling: Secondary | ICD-10-CM

## 2023-12-09 DIAGNOSIS — L718 Other rosacea: Secondary | ICD-10-CM | POA: Diagnosis not present

## 2023-12-09 DIAGNOSIS — Z79899 Other long term (current) drug therapy: Secondary | ICD-10-CM

## 2023-12-09 DIAGNOSIS — L603 Nail dystrophy: Secondary | ICD-10-CM | POA: Diagnosis not present

## 2023-12-09 DIAGNOSIS — L821 Other seborrheic keratosis: Secondary | ICD-10-CM

## 2023-12-09 DIAGNOSIS — L719 Rosacea, unspecified: Secondary | ICD-10-CM

## 2023-12-09 DIAGNOSIS — L820 Inflamed seborrheic keratosis: Secondary | ICD-10-CM

## 2023-12-09 MED ORDER — MUPIROCIN 2 % EX OINT: TOPICAL_OINTMENT | CUTANEOUS | 1 refills | Status: AC

## 2023-12-09 NOTE — Progress Notes (Unsigned)
 Follow-Up Visit   Subjective  Juan Holmes is a 73 y.o. male who presents for the following: Rosacea 1 year follow-up, not currently using anything. Patient reports SM metronidazole /ivermectin/azelaic acid burned face, did use it for about 3 weeks before making face red. Patient reports he has noticed he has not flared since not drinking alcohol and only drinking decaf coffee. Two places he would like looked at, present for about 4 months at chest itching and bothersome.   The following portions of the chart were reviewed this encounter and updated as appropriate: medications, allergies, medical history  Review of Systems:  No other skin or systemic complaints except as noted in HPI or Assessment and Plan.  Objective  Well appearing patient in no apparent distress; mood and affect are within normal limits.  A focused examination was performed of the following areas: Face, chest, left hand  Relevant exam findings are noted in the Assessment and Plan.    Left chest x1, R chest x1 (2) Stuck on waxy paps with erythema  Assessment & Plan   ROSACEA  & ocular rosacea  (Ocular signs and symptoms now resolved after course of oral doxycycline ) Exam Mid face erythema with bumps and telangiectasias Chronic and persistent condition with duration or expected duration over one year. Condition is improving with treatment but not currently at goal. Rosacea is a chronic progressive skin condition usually affecting the face of adults, causing redness and/or acne bumps. It is treatable but not curable. It sometimes affects the eyes (ocular rosacea) as well. It may respond to topical and/or systemic medication and can flare with stress, sun exposure, alcohol, exercise, topical steroids (including hydrocortisone/cortisone 10) and some foods.  Daily application of broad spectrum spf 30+ sunscreen to face is recommended to reduce flares.   Patient denies grittiness of the eyes.  Pt advised that signs and  symptoms of ocular rosacea may recur in future and he may benefit from re-starting oral doxycycline  if it does recur.   Treatment Plan Counseling for BBL / IPL / Laser and Coordination of Care Discussed the treatment option of Broad Band Light (BBL) /Intense Pulsed Light (IPL)/ Laser for skin discoloration, including brown spots and redness.  Typically we recommend at least 1-3 treatment sessions about 5-8 weeks apart for best results.  Cannot have tanned skin when BBL performed, and regular use of sunscreen is advised after the procedure to help maintain results. The patient's condition may also require maintenance treatments in the future.  The fee for BBL / laser treatments is $350 per treatment session for the whole face.  A fee can be quoted for other parts of the body.  Insurance typically does not pay for BBL/laser treatments and therefore the fee is an out-of-pocket cost.  SEBORRHEIC KERATOSIS - Stuck-on, waxy, tan-brown papules and/or plaques. - Benign-appearing - Discussed benign etiology and prognosis. - Observe - Call for any changes  PARONYCHIA Now calm but residual nail dystrophy Exam: Nail dystrophy of the lateral nail fold of left index and left middle finger. Photos taken today. Chronic and persistent condition with duration or expected duration over one year. Condition is bothersome/symptomatic for patient.  Counseling on Paronychia Paronychia is inflammation of nail fold(s) due to infection (bacterial, fungal) and/or chronic inflammation from irritant hand dermatitis due to occupational exposures (dishwashing).  It may also be an adverse reaction to a medication.   Acute paronychia presents with marked tenderness, redness, and possible bacterial abscess, duration < 6 weeks.  Condition is treated by  draining abscess and with oral and topical antibiotics. Chronic paronychia presents with mild/moderate tenderness, redness, without abscess, duration > 6 weeks.  Prolonged wet work  and irritants should be avoided. Keep the area dry and avoid trauma (from manicuring).  Dishwashers and others who cannot avoid exposures should use rubber gloves with cotton liner inserts. Condition is treated with prescription topical steroids and topical antifungals. Treatment Plan: Start mupirocin  2% ointment, apply to aa, nail folds prn flares.  Patient advised to not cut nails too far back, may be a cause of flare.   INFLAMED SEBORRHEIC KERATOSIS (2) Left chest x1, R chest x1 (2) Symptomatic, irritating, patient would like treated. Destruction of lesion - Left chest x1, R chest x1 (2) Complexity: simple   Destruction method: cryotherapy   Informed consent: discussed and consent obtained   Timeout:  patient name, date of birth, surgical site, and procedure verified Lesion destroyed using liquid nitrogen: Yes   Region frozen until ice ball extended beyond lesion: Yes   Outcome: patient tolerated procedure well with no complications   Post-procedure details: wound care instructions given   Additional details:  Prior to procedure, discussed risks of blister formation, small wound, skin dyspigmentation, or rare scar following cryotherapy. Recommend Vaseline ointment to treated areas while healing.   PARONYCHIA OF FINGER, LEFT   SEBORRHEIC KERATOSIS   ROSACEA   COUNSELING AND COORDINATION OF CARE   MEDICATION MANAGEMENT    Return in about 1 year (around 12/08/2024) for Rosacea, w/ Dr. Hester.  I, Jacquelynn V. Wilfred, CMA, am acting as scribe for Alm Hester, MD .  Documentation: I have reviewed the above documentation for accuracy and completeness, and I agree with the above.  Alm Hester, MD

## 2023-12-09 NOTE — Patient Instructions (Addendum)

## 2023-12-10 ENCOUNTER — Encounter: Payer: Self-pay | Admitting: Dermatology

## 2024-01-11 ENCOUNTER — Encounter: Payer: Self-pay | Admitting: Radiology

## 2024-06-02 ENCOUNTER — Encounter

## 2024-10-19 ENCOUNTER — Ambulatory Visit

## 2024-12-27 ENCOUNTER — Ambulatory Visit: Admitting: Dermatology
